# Patient Record
Sex: Male | Born: 1945 | Race: Black or African American | Hispanic: No | Marital: Married | Smoking: Former smoker
Health system: Southern US, Community
[De-identification: ages and names within clinical notes are randomized; demographics above are authoritative.]

## PROBLEM LIST (undated history)

## (undated) DIAGNOSIS — I2510 Atherosclerotic heart disease of native coronary artery without angina pectoris: Secondary | ICD-10-CM

## (undated) DIAGNOSIS — E785 Hyperlipidemia, unspecified: Secondary | ICD-10-CM

## (undated) DIAGNOSIS — K269 Duodenal ulcer, unspecified as acute or chronic, without hemorrhage or perforation: Secondary | ICD-10-CM

## (undated) DIAGNOSIS — E119 Type 2 diabetes mellitus without complications: Secondary | ICD-10-CM

## (undated) DIAGNOSIS — D649 Anemia, unspecified: Secondary | ICD-10-CM

## (undated) DIAGNOSIS — N529 Male erectile dysfunction, unspecified: Secondary | ICD-10-CM

## (undated) DIAGNOSIS — M1990 Unspecified osteoarthritis, unspecified site: Secondary | ICD-10-CM

## (undated) DIAGNOSIS — K219 Gastro-esophageal reflux disease without esophagitis: Secondary | ICD-10-CM

## (undated) DIAGNOSIS — I10 Essential (primary) hypertension: Secondary | ICD-10-CM

## (undated) DIAGNOSIS — K5790 Diverticulosis of intestine, part unspecified, without perforation or abscess without bleeding: Secondary | ICD-10-CM

## (undated) HISTORY — PX: VASECTOMY: SHX75

## (undated) HISTORY — PX: CARDIAC CATHETERIZATION: SHX172

## (undated) HISTORY — PX: JOINT REPLACEMENT: SHX530

---

## 2004-06-18 ENCOUNTER — Ambulatory Visit: Payer: Self-pay | Admitting: Unknown Physician Specialty

## 2007-12-07 ENCOUNTER — Ambulatory Visit: Payer: Self-pay | Admitting: Family Medicine

## 2008-05-10 ENCOUNTER — Ambulatory Visit: Payer: Self-pay | Admitting: Cardiology

## 2009-07-02 ENCOUNTER — Ambulatory Visit: Payer: Self-pay

## 2009-08-06 ENCOUNTER — Ambulatory Visit: Payer: Self-pay | Admitting: Pain Medicine

## 2009-08-20 ENCOUNTER — Ambulatory Visit: Payer: Self-pay | Admitting: Pain Medicine

## 2009-09-11 ENCOUNTER — Ambulatory Visit: Payer: Self-pay | Admitting: Pain Medicine

## 2009-09-24 ENCOUNTER — Ambulatory Visit: Payer: Self-pay | Admitting: Pain Medicine

## 2009-10-08 ENCOUNTER — Ambulatory Visit: Payer: Self-pay | Admitting: Pain Medicine

## 2009-10-24 ENCOUNTER — Ambulatory Visit: Payer: Self-pay | Admitting: Pain Medicine

## 2010-09-22 ENCOUNTER — Ambulatory Visit: Payer: Self-pay | Admitting: Unknown Physician Specialty

## 2010-10-01 ENCOUNTER — Inpatient Hospital Stay: Payer: Self-pay | Admitting: Unknown Physician Specialty

## 2013-11-01 DIAGNOSIS — E119 Type 2 diabetes mellitus without complications: Secondary | ICD-10-CM

## 2014-08-06 DIAGNOSIS — E1140 Type 2 diabetes mellitus with diabetic neuropathy, unspecified: Secondary | ICD-10-CM | POA: Diagnosis not present

## 2014-08-06 DIAGNOSIS — E780 Pure hypercholesterolemia: Secondary | ICD-10-CM | POA: Diagnosis not present

## 2014-08-08 DIAGNOSIS — I10 Essential (primary) hypertension: Secondary | ICD-10-CM | POA: Diagnosis not present

## 2014-08-08 DIAGNOSIS — E780 Pure hypercholesterolemia: Secondary | ICD-10-CM | POA: Diagnosis not present

## 2014-08-08 DIAGNOSIS — I2510 Atherosclerotic heart disease of native coronary artery without angina pectoris: Secondary | ICD-10-CM | POA: Diagnosis not present

## 2014-08-08 DIAGNOSIS — R0602 Shortness of breath: Secondary | ICD-10-CM | POA: Diagnosis not present

## 2014-08-10 DIAGNOSIS — K219 Gastro-esophageal reflux disease without esophagitis: Secondary | ICD-10-CM | POA: Diagnosis not present

## 2014-08-10 DIAGNOSIS — I10 Essential (primary) hypertension: Secondary | ICD-10-CM | POA: Diagnosis not present

## 2014-08-10 DIAGNOSIS — E780 Pure hypercholesterolemia: Secondary | ICD-10-CM | POA: Diagnosis not present

## 2014-08-10 DIAGNOSIS — E119 Type 2 diabetes mellitus without complications: Secondary | ICD-10-CM | POA: Diagnosis not present

## 2014-08-21 DIAGNOSIS — R0602 Shortness of breath: Secondary | ICD-10-CM | POA: Diagnosis not present

## 2014-08-23 DIAGNOSIS — E780 Pure hypercholesterolemia: Secondary | ICD-10-CM | POA: Diagnosis not present

## 2014-08-23 DIAGNOSIS — Z9861 Coronary angioplasty status: Secondary | ICD-10-CM | POA: Diagnosis not present

## 2014-08-23 DIAGNOSIS — I10 Essential (primary) hypertension: Secondary | ICD-10-CM | POA: Diagnosis not present

## 2014-08-23 DIAGNOSIS — I2510 Atherosclerotic heart disease of native coronary artery without angina pectoris: Secondary | ICD-10-CM | POA: Diagnosis not present

## 2014-11-02 DIAGNOSIS — E119 Type 2 diabetes mellitus without complications: Secondary | ICD-10-CM | POA: Diagnosis not present

## 2014-11-02 DIAGNOSIS — E780 Pure hypercholesterolemia: Secondary | ICD-10-CM | POA: Diagnosis not present

## 2014-11-09 DIAGNOSIS — K219 Gastro-esophageal reflux disease without esophagitis: Secondary | ICD-10-CM | POA: Diagnosis not present

## 2014-11-09 DIAGNOSIS — E780 Pure hypercholesterolemia: Secondary | ICD-10-CM | POA: Diagnosis not present

## 2014-11-09 DIAGNOSIS — E119 Type 2 diabetes mellitus without complications: Secondary | ICD-10-CM | POA: Diagnosis not present

## 2014-11-09 DIAGNOSIS — I10 Essential (primary) hypertension: Secondary | ICD-10-CM | POA: Diagnosis not present

## 2015-02-08 DIAGNOSIS — E780 Pure hypercholesterolemia: Secondary | ICD-10-CM | POA: Diagnosis not present

## 2015-02-08 DIAGNOSIS — E119 Type 2 diabetes mellitus without complications: Secondary | ICD-10-CM | POA: Diagnosis not present

## 2015-02-14 DIAGNOSIS — I10 Essential (primary) hypertension: Secondary | ICD-10-CM | POA: Diagnosis not present

## 2015-02-14 DIAGNOSIS — K219 Gastro-esophageal reflux disease without esophagitis: Secondary | ICD-10-CM | POA: Diagnosis not present

## 2015-02-14 DIAGNOSIS — E780 Pure hypercholesterolemia: Secondary | ICD-10-CM | POA: Diagnosis not present

## 2015-02-14 DIAGNOSIS — E119 Type 2 diabetes mellitus without complications: Secondary | ICD-10-CM | POA: Diagnosis not present

## 2015-05-10 DIAGNOSIS — E119 Type 2 diabetes mellitus without complications: Secondary | ICD-10-CM | POA: Diagnosis not present

## 2015-05-10 DIAGNOSIS — E7800 Pure hypercholesterolemia, unspecified: Secondary | ICD-10-CM | POA: Diagnosis not present

## 2015-05-14 DIAGNOSIS — I10 Essential (primary) hypertension: Secondary | ICD-10-CM | POA: Diagnosis not present

## 2015-05-14 DIAGNOSIS — E119 Type 2 diabetes mellitus without complications: Secondary | ICD-10-CM | POA: Diagnosis not present

## 2015-05-14 DIAGNOSIS — Z9861 Coronary angioplasty status: Secondary | ICD-10-CM | POA: Diagnosis not present

## 2015-05-14 DIAGNOSIS — I2510 Atherosclerotic heart disease of native coronary artery without angina pectoris: Secondary | ICD-10-CM | POA: Diagnosis not present

## 2015-05-14 DIAGNOSIS — E7800 Pure hypercholesterolemia, unspecified: Secondary | ICD-10-CM | POA: Diagnosis not present

## 2015-07-05 DIAGNOSIS — K219 Gastro-esophageal reflux disease without esophagitis: Secondary | ICD-10-CM | POA: Diagnosis not present

## 2015-07-05 DIAGNOSIS — I10 Essential (primary) hypertension: Secondary | ICD-10-CM | POA: Diagnosis not present

## 2015-07-05 DIAGNOSIS — E1142 Type 2 diabetes mellitus with diabetic polyneuropathy: Secondary | ICD-10-CM | POA: Diagnosis not present

## 2015-07-05 DIAGNOSIS — E7800 Pure hypercholesterolemia, unspecified: Secondary | ICD-10-CM | POA: Diagnosis not present

## 2015-12-05 DIAGNOSIS — I2510 Atherosclerotic heart disease of native coronary artery without angina pectoris: Secondary | ICD-10-CM | POA: Diagnosis not present

## 2015-12-05 DIAGNOSIS — E7800 Pure hypercholesterolemia, unspecified: Secondary | ICD-10-CM | POA: Diagnosis not present

## 2015-12-05 DIAGNOSIS — I10 Essential (primary) hypertension: Secondary | ICD-10-CM | POA: Diagnosis not present

## 2015-12-05 DIAGNOSIS — Z9861 Coronary angioplasty status: Secondary | ICD-10-CM | POA: Diagnosis not present

## 2015-12-27 DIAGNOSIS — E7800 Pure hypercholesterolemia, unspecified: Secondary | ICD-10-CM | POA: Diagnosis not present

## 2015-12-27 DIAGNOSIS — E1142 Type 2 diabetes mellitus with diabetic polyneuropathy: Secondary | ICD-10-CM | POA: Diagnosis not present

## 2016-01-15 DIAGNOSIS — I10 Essential (primary) hypertension: Secondary | ICD-10-CM | POA: Diagnosis not present

## 2016-01-15 DIAGNOSIS — E1142 Type 2 diabetes mellitus with diabetic polyneuropathy: Secondary | ICD-10-CM | POA: Diagnosis not present

## 2016-01-15 DIAGNOSIS — K219 Gastro-esophageal reflux disease without esophagitis: Secondary | ICD-10-CM | POA: Diagnosis not present

## 2016-01-15 DIAGNOSIS — E1165 Type 2 diabetes mellitus with hyperglycemia: Secondary | ICD-10-CM | POA: Diagnosis not present

## 2016-01-15 DIAGNOSIS — E7800 Pure hypercholesterolemia, unspecified: Secondary | ICD-10-CM | POA: Diagnosis not present

## 2016-04-09 DIAGNOSIS — E1142 Type 2 diabetes mellitus with diabetic polyneuropathy: Secondary | ICD-10-CM | POA: Diagnosis not present

## 2016-04-09 DIAGNOSIS — E7800 Pure hypercholesterolemia, unspecified: Secondary | ICD-10-CM | POA: Diagnosis not present

## 2016-04-09 DIAGNOSIS — E1165 Type 2 diabetes mellitus with hyperglycemia: Secondary | ICD-10-CM | POA: Diagnosis not present

## 2016-04-20 DIAGNOSIS — E7800 Pure hypercholesterolemia, unspecified: Secondary | ICD-10-CM | POA: Diagnosis not present

## 2016-04-20 DIAGNOSIS — E1165 Type 2 diabetes mellitus with hyperglycemia: Secondary | ICD-10-CM | POA: Diagnosis not present

## 2016-04-20 DIAGNOSIS — I10 Essential (primary) hypertension: Secondary | ICD-10-CM | POA: Diagnosis not present

## 2016-04-20 DIAGNOSIS — K219 Gastro-esophageal reflux disease without esophagitis: Secondary | ICD-10-CM | POA: Diagnosis not present

## 2016-04-20 DIAGNOSIS — E1142 Type 2 diabetes mellitus with diabetic polyneuropathy: Secondary | ICD-10-CM | POA: Diagnosis not present

## 2016-07-07 DIAGNOSIS — Z9861 Coronary angioplasty status: Secondary | ICD-10-CM | POA: Diagnosis not present

## 2016-07-07 DIAGNOSIS — N521 Erectile dysfunction due to diseases classified elsewhere: Secondary | ICD-10-CM | POA: Diagnosis not present

## 2016-07-07 DIAGNOSIS — E7800 Pure hypercholesterolemia, unspecified: Secondary | ICD-10-CM | POA: Diagnosis not present

## 2016-07-07 DIAGNOSIS — I10 Essential (primary) hypertension: Secondary | ICD-10-CM | POA: Diagnosis not present

## 2016-07-07 DIAGNOSIS — I2510 Atherosclerotic heart disease of native coronary artery without angina pectoris: Secondary | ICD-10-CM | POA: Diagnosis not present

## 2016-07-09 DIAGNOSIS — E1142 Type 2 diabetes mellitus with diabetic polyneuropathy: Secondary | ICD-10-CM | POA: Diagnosis not present

## 2016-07-09 DIAGNOSIS — E7800 Pure hypercholesterolemia, unspecified: Secondary | ICD-10-CM | POA: Diagnosis not present

## 2016-07-09 DIAGNOSIS — E1165 Type 2 diabetes mellitus with hyperglycemia: Secondary | ICD-10-CM | POA: Diagnosis not present

## 2016-07-16 DIAGNOSIS — E1142 Type 2 diabetes mellitus with diabetic polyneuropathy: Secondary | ICD-10-CM | POA: Diagnosis not present

## 2016-07-16 DIAGNOSIS — I10 Essential (primary) hypertension: Secondary | ICD-10-CM | POA: Diagnosis not present

## 2016-07-16 DIAGNOSIS — K219 Gastro-esophageal reflux disease without esophagitis: Secondary | ICD-10-CM | POA: Diagnosis not present

## 2016-07-16 DIAGNOSIS — E7800 Pure hypercholesterolemia, unspecified: Secondary | ICD-10-CM | POA: Diagnosis not present

## 2016-09-25 DIAGNOSIS — E1142 Type 2 diabetes mellitus with diabetic polyneuropathy: Secondary | ICD-10-CM | POA: Diagnosis not present

## 2016-09-25 DIAGNOSIS — E7800 Pure hypercholesterolemia, unspecified: Secondary | ICD-10-CM | POA: Diagnosis not present

## 2016-10-01 DIAGNOSIS — E1142 Type 2 diabetes mellitus with diabetic polyneuropathy: Secondary | ICD-10-CM | POA: Diagnosis not present

## 2016-10-01 DIAGNOSIS — K219 Gastro-esophageal reflux disease without esophagitis: Secondary | ICD-10-CM | POA: Diagnosis not present

## 2016-10-01 DIAGNOSIS — I10 Essential (primary) hypertension: Secondary | ICD-10-CM | POA: Diagnosis not present

## 2016-10-01 DIAGNOSIS — E7800 Pure hypercholesterolemia, unspecified: Secondary | ICD-10-CM | POA: Diagnosis not present

## 2016-12-31 DIAGNOSIS — E7800 Pure hypercholesterolemia, unspecified: Secondary | ICD-10-CM | POA: Diagnosis not present

## 2016-12-31 DIAGNOSIS — E1142 Type 2 diabetes mellitus with diabetic polyneuropathy: Secondary | ICD-10-CM | POA: Diagnosis not present

## 2017-01-06 DIAGNOSIS — Z23 Encounter for immunization: Secondary | ICD-10-CM | POA: Diagnosis not present

## 2017-01-06 DIAGNOSIS — Z6835 Body mass index (BMI) 35.0-35.9, adult: Secondary | ICD-10-CM | POA: Diagnosis not present

## 2017-01-06 DIAGNOSIS — K219 Gastro-esophageal reflux disease without esophagitis: Secondary | ICD-10-CM | POA: Diagnosis not present

## 2017-01-06 DIAGNOSIS — I10 Essential (primary) hypertension: Secondary | ICD-10-CM | POA: Diagnosis not present

## 2017-01-06 DIAGNOSIS — E1165 Type 2 diabetes mellitus with hyperglycemia: Secondary | ICD-10-CM | POA: Diagnosis not present

## 2017-01-06 DIAGNOSIS — E7800 Pure hypercholesterolemia, unspecified: Secondary | ICD-10-CM | POA: Diagnosis not present

## 2017-01-06 DIAGNOSIS — E1142 Type 2 diabetes mellitus with diabetic polyneuropathy: Secondary | ICD-10-CM | POA: Diagnosis not present

## 2017-01-06 DIAGNOSIS — Z1211 Encounter for screening for malignant neoplasm of colon: Secondary | ICD-10-CM | POA: Diagnosis not present

## 2017-01-12 DIAGNOSIS — I2510 Atherosclerotic heart disease of native coronary artery without angina pectoris: Secondary | ICD-10-CM | POA: Diagnosis not present

## 2017-01-12 DIAGNOSIS — Z9861 Coronary angioplasty status: Secondary | ICD-10-CM | POA: Diagnosis not present

## 2017-01-12 DIAGNOSIS — I10 Essential (primary) hypertension: Secondary | ICD-10-CM | POA: Diagnosis not present

## 2017-01-12 DIAGNOSIS — E119 Type 2 diabetes mellitus without complications: Secondary | ICD-10-CM | POA: Diagnosis not present

## 2017-01-12 DIAGNOSIS — E7800 Pure hypercholesterolemia, unspecified: Secondary | ICD-10-CM | POA: Diagnosis not present

## 2017-03-10 DIAGNOSIS — K219 Gastro-esophageal reflux disease without esophagitis: Secondary | ICD-10-CM | POA: Diagnosis not present

## 2017-03-10 DIAGNOSIS — Z1211 Encounter for screening for malignant neoplasm of colon: Secondary | ICD-10-CM | POA: Diagnosis not present

## 2017-04-09 DIAGNOSIS — E785 Hyperlipidemia, unspecified: Secondary | ICD-10-CM | POA: Diagnosis not present

## 2017-04-09 DIAGNOSIS — E1142 Type 2 diabetes mellitus with diabetic polyneuropathy: Secondary | ICD-10-CM | POA: Diagnosis not present

## 2017-04-12 DIAGNOSIS — E1165 Type 2 diabetes mellitus with hyperglycemia: Secondary | ICD-10-CM | POA: Diagnosis not present

## 2017-04-12 DIAGNOSIS — E1142 Type 2 diabetes mellitus with diabetic polyneuropathy: Secondary | ICD-10-CM | POA: Diagnosis not present

## 2017-04-12 DIAGNOSIS — E7800 Pure hypercholesterolemia, unspecified: Secondary | ICD-10-CM | POA: Diagnosis not present

## 2017-04-12 DIAGNOSIS — Z6835 Body mass index (BMI) 35.0-35.9, adult: Secondary | ICD-10-CM | POA: Diagnosis not present

## 2017-04-12 DIAGNOSIS — I10 Essential (primary) hypertension: Secondary | ICD-10-CM | POA: Diagnosis not present

## 2017-04-12 DIAGNOSIS — K219 Gastro-esophageal reflux disease without esophagitis: Secondary | ICD-10-CM | POA: Diagnosis not present

## 2017-05-28 ENCOUNTER — Encounter: Payer: Self-pay | Admitting: *Deleted

## 2017-05-31 ENCOUNTER — Ambulatory Visit: Payer: Medicare HMO | Admitting: Anesthesiology

## 2017-05-31 ENCOUNTER — Encounter
Admission: RE | Disposition: A | Discharge status: Home / Self Care | Payer: Self-pay | Source: Ambulatory Visit | Attending: Unknown Physician Specialty

## 2017-05-31 ENCOUNTER — Ambulatory Visit
Admission: RE | Admit: 2017-05-31 | Discharge: 2017-05-31 | Disposition: A | Discharge status: Home / Self Care | Payer: Medicare HMO | Source: Ambulatory Visit | Attending: Unknown Physician Specialty | Admitting: Unknown Physician Specialty

## 2017-05-31 DIAGNOSIS — Z1211 Encounter for screening for malignant neoplasm of colon: Secondary | ICD-10-CM | POA: Insufficient documentation

## 2017-05-31 DIAGNOSIS — I10 Essential (primary) hypertension: Secondary | ICD-10-CM | POA: Diagnosis not present

## 2017-05-31 DIAGNOSIS — Z86010 Personal history of colonic polyps: Secondary | ICD-10-CM | POA: Diagnosis not present

## 2017-05-31 DIAGNOSIS — Z87891 Personal history of nicotine dependence: Secondary | ICD-10-CM | POA: Diagnosis not present

## 2017-05-31 DIAGNOSIS — Z7982 Long term (current) use of aspirin: Secondary | ICD-10-CM | POA: Insufficient documentation

## 2017-05-31 DIAGNOSIS — K5730 Diverticulosis of large intestine without perforation or abscess without bleeding: Secondary | ICD-10-CM | POA: Diagnosis not present

## 2017-05-31 DIAGNOSIS — Z7984 Long term (current) use of oral hypoglycemic drugs: Secondary | ICD-10-CM | POA: Diagnosis not present

## 2017-05-31 DIAGNOSIS — K640 First degree hemorrhoids: Secondary | ICD-10-CM | POA: Diagnosis not present

## 2017-05-31 DIAGNOSIS — K219 Gastro-esophageal reflux disease without esophagitis: Secondary | ICD-10-CM | POA: Insufficient documentation

## 2017-05-31 DIAGNOSIS — E119 Type 2 diabetes mellitus without complications: Secondary | ICD-10-CM | POA: Diagnosis not present

## 2017-05-31 DIAGNOSIS — D122 Benign neoplasm of ascending colon: Secondary | ICD-10-CM | POA: Diagnosis not present

## 2017-05-31 DIAGNOSIS — Z79899 Other long term (current) drug therapy: Secondary | ICD-10-CM | POA: Diagnosis not present

## 2017-05-31 DIAGNOSIS — Z6834 Body mass index (BMI) 34.0-34.9, adult: Secondary | ICD-10-CM | POA: Diagnosis not present

## 2017-05-31 DIAGNOSIS — K621 Rectal polyp: Secondary | ICD-10-CM | POA: Insufficient documentation

## 2017-05-31 DIAGNOSIS — D128 Benign neoplasm of rectum: Secondary | ICD-10-CM | POA: Diagnosis not present

## 2017-05-31 DIAGNOSIS — I2510 Atherosclerotic heart disease of native coronary artery without angina pectoris: Secondary | ICD-10-CM | POA: Diagnosis not present

## 2017-05-31 DIAGNOSIS — E785 Hyperlipidemia, unspecified: Secondary | ICD-10-CM | POA: Insufficient documentation

## 2017-05-31 DIAGNOSIS — K635 Polyp of colon: Secondary | ICD-10-CM | POA: Diagnosis not present

## 2017-05-31 DIAGNOSIS — K648 Other hemorrhoids: Secondary | ICD-10-CM | POA: Diagnosis not present

## 2017-05-31 DIAGNOSIS — K5790 Diverticulosis of intestine, part unspecified, without perforation or abscess without bleeding: Secondary | ICD-10-CM | POA: Diagnosis not present

## 2017-05-31 HISTORY — DX: Anemia, unspecified: D64.9

## 2017-05-31 HISTORY — DX: Type 2 diabetes mellitus without complications: E11.9

## 2017-05-31 HISTORY — DX: Atherosclerotic heart disease of native coronary artery without angina pectoris: I25.10

## 2017-05-31 HISTORY — DX: Male erectile dysfunction, unspecified: N52.9

## 2017-05-31 HISTORY — DX: Unspecified osteoarthritis, unspecified site: M19.90

## 2017-05-31 HISTORY — DX: Hyperlipidemia, unspecified: E78.5

## 2017-05-31 HISTORY — PX: COLONOSCOPY WITH PROPOFOL: SHX5780

## 2017-05-31 HISTORY — DX: Essential (primary) hypertension: I10

## 2017-05-31 HISTORY — DX: Gastro-esophageal reflux disease without esophagitis: K21.9

## 2017-05-31 HISTORY — DX: Diverticulosis of intestine, part unspecified, without perforation or abscess without bleeding: K57.90

## 2017-05-31 HISTORY — DX: Duodenal ulcer, unspecified as acute or chronic, without hemorrhage or perforation: K26.9

## 2017-05-31 LAB — GLUCOSE, CAPILLARY: Glucose-Capillary: 181 mg/dL — ABNORMAL HIGH (ref 65–99)

## 2017-05-31 SURGERY — COLONOSCOPY WITH PROPOFOL
Anesthesia: General

## 2017-05-31 MED ORDER — VANCOMYCIN HCL IN DEXTROSE 1-5 GM/200ML-% IV SOLN
INTRAVENOUS | Status: AC
  Filled 2017-05-31: qty 200

## 2017-05-31 MED ORDER — VANCOMYCIN HCL IN DEXTROSE 1-5 GM/200ML-% IV SOLN
1000.0000 mg | Freq: Once | INTRAVENOUS | Status: AC
  Administered 2017-05-31: 1000 mg via INTRAVENOUS

## 2017-05-31 MED ORDER — GENTAMICIN IN SALINE 1-0.9 MG/ML-% IV SOLN
100.0000 mg | Freq: Once | INTRAVENOUS | Status: DC
  Filled 2017-05-31: qty 100

## 2017-05-31 MED ORDER — PROPOFOL 500 MG/50ML IV EMUL
INTRAVENOUS | Status: DC | PRN
  Administered 2017-05-31: 200 ug/kg/min via INTRAVENOUS

## 2017-05-31 MED ORDER — GENTAMICIN SULFATE 40 MG/ML IJ SOLN
100.0000 mg | Freq: Once | INTRAVENOUS | Status: AC
  Administered 2017-05-31: 100 mg via INTRAVENOUS
  Filled 2017-05-31: qty 2.5

## 2017-05-31 MED ORDER — PROPOFOL 500 MG/50ML IV EMUL
INTRAVENOUS | Status: AC
  Filled 2017-05-31: qty 50

## 2017-05-31 MED ORDER — PROPOFOL 10 MG/ML IV BOLUS
INTRAVENOUS | Status: DC | PRN
  Administered 2017-05-31: 80 mg via INTRAVENOUS

## 2017-05-31 MED ORDER — LIDOCAINE HCL (PF) 2 % IJ SOLN
INTRAMUSCULAR | Status: AC
  Filled 2017-05-31: qty 10

## 2017-05-31 MED ORDER — SODIUM CHLORIDE 0.9 % IV SOLN: INTRAVENOUS | Status: DC

## 2017-05-31 MED ORDER — LIDOCAINE 2% (20 MG/ML) 5 ML SYRINGE
INTRAMUSCULAR | Status: DC | PRN
  Administered 2017-05-31: 50 mg via INTRAVENOUS

## 2017-05-31 MED ORDER — SODIUM CHLORIDE 0.9 % IV SOLN
INTRAVENOUS | Status: DC
  Administered 2017-05-31: 1000 mL via INTRAVENOUS

## 2017-05-31 NOTE — H&P (Signed)
Primary Care Physician:  Sofie Hartigan, MD Primary Gastroenterologist:  Dr. Vira Agar  Pre-Procedure History & Physical: HPI:  Jordan [name] is a 71 y.o. male is here for an colonoscopy.   Past Medical History:  Diagnosis Date  . Anemia   . Arthritis   . Coronary artery disease   . Diabetes mellitus without complication ([place])   . Diverticulosis   . Duodenal ulcer   . Erectile dysfunction   . GERD (gastroesophageal reflux disease)   . Hyperlipidemia   . Hypertension     Past Surgical History:  Procedure Laterality Date  . CARDIAC CATHETERIZATION    . JOINT REPLACEMENT    . VASECTOMY      Prior to Admission medications   Medication Sig Start Date End Date Taking? Authorizing Provider  amLODipine (NORVASC) 10 MG tablet Take 10 mg by mouth daily.   Yes [provider]  ascorbic acid (VITAMIN C) 1000 MG tablet Take 1,000 mg by mouth daily.   Yes [provider]  aspirin EC 81 MG tablet Take 81 mg by mouth daily.   Yes [provider]  atorvastatin (LIPITOR) 20 MG tablet Take 20 mg by mouth daily.   Yes [provider]  CINNAMON PO Take by mouth daily.   Yes [provider]  hydrochlorothiazide (HYDRODIURIL) 25 MG tablet Take 25 mg by mouth daily.   Yes [provider]  metFORMIN (GLUCOPHAGE) 1000 MG tablet Take 1,000 mg by mouth 2 (two) times daily with a meal.   Yes [provider]  omega-3 acid ethyl esters (LOVAZA) 1 g capsule Take 1 capsule by mouth 2 (two) times daily.   Yes [provider]  ramipril (ALTACE) 10 MG capsule Take 10 mg by mouth 2 (two) times daily.   Yes [provider]  ranitidine (ZANTAC) 150 MG capsule Take 150 mg by mouth daily.   Yes [provider]  sitaGLIPtin (JANUVIA) 100 MG tablet Take 100 mg by mouth daily. Taking 1/2 tablet by mouth daily   Yes [provider]    Allergies as of 03/16/2017  . (Not on File)    History reviewed. No pertinent  family history.  Social History   Social History  . Marital status: Married    Spouse name: N/A  . Number of children: N/A  . Years of education: N/A   Occupational History  . Not on file.   Social History Main Topics  . Smoking status: Former Smoker    Types: Cigarettes    Quit date: 11/02/1987  . Smokeless tobacco: Never Used  . Alcohol use No  . Drug use: No  . Sexual activity: Not on file   Other Topics Concern  . Not on file   Social History Narrative  . No narrative on file    Review of Systems: See HPI, otherwise negative ROS  Physical Exam: BP (!) 143/90   Pulse 80   Temp (!) 97 F (36.1 C) (Tympanic)   Resp (!) 21   Ht 5\' 11"  (1.803 m)   Wt 112 kg (247 lb)   SpO2 95%   BMI 34.45 kg/m  General:   Alert,  pleasant and cooperative in NAD Head:  Normocephalic and atraumatic. Neck:  Supple; no masses or thyromegaly. Lungs:  Clear throughout to auscultation.    Heart:  Regular rate and rhythm. Abdomen:  Soft, nontender and nondistended. Normal bowel sounds, without guarding, and without rebound.   Neurologic:  Alert and  oriented x4;  grossly normal neurologically.  Impression/Plan: Jordan [name] is here for an colonoscopy to be performed for colon cancer screening.  Risks, benefits, limitations, and alternatives regarding  colonoscopy have been reviewed with the patient.  Questions have been answered.  All parties agreeable.   Gaylyn Cheers, MD  05/31/2017, 10:26 AM

## 2017-05-31 NOTE — Op Note (Signed)
[facility] Patient Name: Jordan [name] Procedure Date: 05/31/2017 9:12 AM MRN: [id number] Account #: [id number] Date of Birth: 02-Jan-1946 Admit Type: Outpatient Age: 71 Room: [facility] ENDO ROOM 1 Gender: Male Note Status: Finalized Procedure:            Colonoscopy Indications:          Screening for colorectal malignant neoplasm Providers:            Manya Silvas, MD Referring MD:         Sofie Hartigan (Referring MD) Medicines:            Propofol per Anesthesia Complications:        No immediate complications. Procedure:            Pre-Anesthesia Assessment:                       - After reviewing the risks and benefits, the patient                        was deemed in satisfactory condition to undergo the                        procedure.                       After obtaining informed consent, the colonoscope was                        passed under direct vision. Throughout the procedure,                        the patient's blood pressure, pulse, and oxygen                        saturations were monitored continuously. The Olympus                        PCF-H180AL colonoscope ( S#: [id number] ) was introduced                        through the anus and advanced to the the cecum,                        identified by appendiceal orifice and ileocecal valve.                        The colonoscopy was performed without difficulty. The                        patient tolerated the procedure well. The quality of                        the bowel preparation was excellent. Findings:      A diminutive polyp was found in the mid ascending colon. The polyp was       sessile. The polyp was removed with a jumbo cold forceps. Resection and       retrieval were complete.      A diminutive polyp was found in the rectum. The polyp was sessile. The       polyp was removed with a jumbo cold  forceps. Resection and retrieval       were complete.  Multiple small-mouthed diverticula were found in the sigmoid colon and       descending colon.      Internal hemorrhoids were found during endoscopy. The hemorrhoids were       small and Grade I (internal hemorrhoids that do not prolapse).      The exam was otherwise without abnormality. Impression:           - One diminutive polyp in the mid ascending colon,                        removed with a jumbo cold forceps. Resected and                        retrieved.                       - One diminutive polyp in the rectum, removed with a                        jumbo cold forceps. Resected and retrieved.                       - Diverticulosis in the sigmoid colon and in the                        descending colon.                       - Internal hemorrhoids.                       - The examination was otherwise normal. Recommendation:       - Await pathology results. Manya Silvas, MD 05/31/2017 9:52:09 AM This report has been signed electronically. Number of Addenda: 0 Note Initiated On: 05/31/2017 9:12 AM Scope Withdrawal Time: 0 hours 8 minutes 59 seconds  Total Procedure Duration: 0 hours 22 minutes 24 seconds       [facility]

## 2017-05-31 NOTE — Anesthesia Postprocedure Evaluation (Signed)
Anesthesia Post Note  Patient: Jordan [name]  Procedure(s) Performed: COLONOSCOPY WITH PROPOFOL (N/A )  Patient location during evaluation: PACU Level of consciousness: awake Pain management: pain level controlled Vital Signs Assessment: post-procedure vital signs reviewed and stable Respiratory status: spontaneous breathing Cardiovascular status: stable Anesthetic complications: no     Last Vitals:  Vitals:   05/31/17 0956 05/31/17 1003  BP: 121/71 (!) 143/90  Pulse: 80   Resp: (!) 21   Temp: (!) 36.1 C   SpO2: 95%     Last Pain:  Vitals:   05/31/17 0956  TempSrc: Tympanic                 VAN [name],Jordan Piscopo

## 2017-05-31 NOTE — Transfer of Care (Signed)
Immediate Anesthesia Transfer of Care Note  Patient: Jordan [name]  Procedure(s) Performed: COLONOSCOPY WITH PROPOFOL (N/A )  Patient Location: Endoscopy Unit  Anesthesia Type:General  Level of Consciousness: awake  Airway & Oxygen Therapy: Patient connected to nasal cannula oxygen  Post-op Assessment: Post -op Vital signs reviewed and stable  Post vital signs: stable  Last Vitals:  Vitals:   05/31/17 0953 05/31/17 0956  BP: 121/71 121/71  Pulse: 78 80  Resp: (!) 21 (!) 21  Temp: (!) 36.1 C (!) 36.1 C  SpO2: 94% 95%    Last Pain:  Vitals:   05/31/17 0956  TempSrc: Tympanic         Complications: No apparent anesthesia complications

## 2017-05-31 NOTE — Anesthesia Post-op Follow-up Note (Signed)
Anesthesia QCDR form completed.        

## 2017-05-31 NOTE — Anesthesia Preprocedure Evaluation (Signed)
Anesthesia Evaluation  Patient identified by MRN, date of birth, ID band Patient awake    Reviewed: Allergy & Precautions, NPO status , Patient's Chart, lab work & pertinent test results  Airway Mallampati: III       Dental  (+) Teeth Intact   Pulmonary former smoker,     + decreased breath sounds      Cardiovascular Exercise Tolerance: Good hypertension, Pt. on medications + CAD   Rhythm:Regular     Neuro/Psych negative neurological ROS  negative psych ROS   GI/Hepatic Neg liver ROS, PUD, GERD  Medicated,  Endo/Other  diabetes, Type 2, Oral Hypoglycemic AgentsMorbid obesity  Renal/GU      Musculoskeletal   Abdominal (+) + obese,   Peds negative pediatric ROS (+)  Hematology  (+) anemia ,   Anesthesia Other Findings   Reproductive/Obstetrics                             Anesthesia Physical Anesthesia Plan  ASA: II  Anesthesia Plan:    Post-op Pain Management:    Induction: Intravenous  PONV Risk Score and Plan: 0  Airway Management Planned: Natural Airway and Nasal Cannula  Additional Equipment:   Intra-op Plan:   Post-operative Plan:   Informed Consent: I have reviewed the patients History and Physical, chart, labs and discussed the procedure including the risks, benefits and alternatives for the proposed anesthesia with the patient or authorized representative who has indicated his/her understanding and acceptance.     Plan Discussed with: CRNA  Anesthesia Plan Comments:         Anesthesia Quick Evaluation

## 2017-06-01 LAB — SURGICAL PATHOLOGY

## 2017-06-02 ENCOUNTER — Encounter: Payer: Self-pay | Admitting: Unknown Physician Specialty

## 2017-06-21 DIAGNOSIS — Z6835 Body mass index (BMI) 35.0-35.9, adult: Secondary | ICD-10-CM | POA: Diagnosis not present

## 2017-06-21 DIAGNOSIS — R413 Other amnesia: Secondary | ICD-10-CM | POA: Diagnosis not present

## 2017-06-21 DIAGNOSIS — E1165 Type 2 diabetes mellitus with hyperglycemia: Secondary | ICD-10-CM | POA: Diagnosis not present

## 2017-07-15 DIAGNOSIS — I10 Essential (primary) hypertension: Secondary | ICD-10-CM | POA: Diagnosis not present

## 2017-07-15 DIAGNOSIS — Z9861 Coronary angioplasty status: Secondary | ICD-10-CM | POA: Diagnosis not present

## 2017-07-15 DIAGNOSIS — I2510 Atherosclerotic heart disease of native coronary artery without angina pectoris: Secondary | ICD-10-CM | POA: Diagnosis not present

## 2017-07-15 DIAGNOSIS — E7800 Pure hypercholesterolemia, unspecified: Secondary | ICD-10-CM | POA: Diagnosis not present

## 2017-07-15 DIAGNOSIS — E1165 Type 2 diabetes mellitus with hyperglycemia: Secondary | ICD-10-CM | POA: Diagnosis not present

## 2017-07-15 DIAGNOSIS — E119 Type 2 diabetes mellitus without complications: Secondary | ICD-10-CM | POA: Diagnosis not present

## 2017-07-15 DIAGNOSIS — R413 Other amnesia: Secondary | ICD-10-CM | POA: Diagnosis not present

## 2017-07-22 DIAGNOSIS — E119 Type 2 diabetes mellitus without complications: Secondary | ICD-10-CM | POA: Diagnosis not present

## 2017-07-22 DIAGNOSIS — Z6835 Body mass index (BMI) 35.0-35.9, adult: Secondary | ICD-10-CM | POA: Diagnosis not present

## 2017-07-22 DIAGNOSIS — E785 Hyperlipidemia, unspecified: Secondary | ICD-10-CM | POA: Diagnosis not present

## 2017-07-22 DIAGNOSIS — Z72820 Sleep deprivation: Secondary | ICD-10-CM | POA: Diagnosis not present

## 2017-10-05 DIAGNOSIS — E7800 Pure hypercholesterolemia, unspecified: Secondary | ICD-10-CM | POA: Diagnosis not present

## 2017-10-12 DIAGNOSIS — E7800 Pure hypercholesterolemia, unspecified: Secondary | ICD-10-CM | POA: Diagnosis not present

## 2017-10-12 DIAGNOSIS — Z0000 Encounter for general adult medical examination without abnormal findings: Secondary | ICD-10-CM | POA: Diagnosis not present

## 2017-10-12 DIAGNOSIS — E1142 Type 2 diabetes mellitus with diabetic polyneuropathy: Secondary | ICD-10-CM | POA: Diagnosis not present

## 2017-10-12 DIAGNOSIS — I10 Essential (primary) hypertension: Secondary | ICD-10-CM | POA: Diagnosis not present

## 2017-10-12 DIAGNOSIS — K219 Gastro-esophageal reflux disease without esophagitis: Secondary | ICD-10-CM | POA: Diagnosis not present

## 2017-10-12 DIAGNOSIS — E1165 Type 2 diabetes mellitus with hyperglycemia: Secondary | ICD-10-CM | POA: Diagnosis not present

## 2017-10-22 DIAGNOSIS — E119 Type 2 diabetes mellitus without complications: Secondary | ICD-10-CM | POA: Diagnosis not present

## 2017-10-22 DIAGNOSIS — Z6835 Body mass index (BMI) 35.0-35.9, adult: Secondary | ICD-10-CM | POA: Diagnosis not present

## 2017-10-22 DIAGNOSIS — E785 Hyperlipidemia, unspecified: Secondary | ICD-10-CM | POA: Diagnosis not present

## 2017-10-22 DIAGNOSIS — E1142 Type 2 diabetes mellitus with diabetic polyneuropathy: Secondary | ICD-10-CM | POA: Diagnosis not present

## 2017-10-22 DIAGNOSIS — Z72820 Sleep deprivation: Secondary | ICD-10-CM | POA: Diagnosis not present

## 2018-01-11 DIAGNOSIS — I10 Essential (primary) hypertension: Secondary | ICD-10-CM | POA: Diagnosis not present

## 2018-01-11 DIAGNOSIS — I2510 Atherosclerotic heart disease of native coronary artery without angina pectoris: Secondary | ICD-10-CM | POA: Diagnosis not present

## 2018-01-11 DIAGNOSIS — Z9861 Coronary angioplasty status: Secondary | ICD-10-CM | POA: Diagnosis not present

## 2018-01-11 DIAGNOSIS — E785 Hyperlipidemia, unspecified: Secondary | ICD-10-CM | POA: Diagnosis not present

## 2018-04-11 DIAGNOSIS — E1142 Type 2 diabetes mellitus with diabetic polyneuropathy: Secondary | ICD-10-CM | POA: Diagnosis not present

## 2018-04-11 DIAGNOSIS — E7800 Pure hypercholesterolemia, unspecified: Secondary | ICD-10-CM | POA: Diagnosis not present

## 2018-04-11 DIAGNOSIS — E1165 Type 2 diabetes mellitus with hyperglycemia: Secondary | ICD-10-CM | POA: Diagnosis not present

## 2018-04-18 DIAGNOSIS — E1165 Type 2 diabetes mellitus with hyperglycemia: Secondary | ICD-10-CM | POA: Diagnosis not present

## 2018-04-18 DIAGNOSIS — K219 Gastro-esophageal reflux disease without esophagitis: Secondary | ICD-10-CM | POA: Diagnosis not present

## 2018-04-18 DIAGNOSIS — I10 Essential (primary) hypertension: Secondary | ICD-10-CM | POA: Diagnosis not present

## 2018-04-18 DIAGNOSIS — E7800 Pure hypercholesterolemia, unspecified: Secondary | ICD-10-CM | POA: Diagnosis not present

## 2018-04-18 DIAGNOSIS — E669 Obesity, unspecified: Secondary | ICD-10-CM | POA: Diagnosis not present

## 2018-04-18 DIAGNOSIS — E1142 Type 2 diabetes mellitus with diabetic polyneuropathy: Secondary | ICD-10-CM | POA: Diagnosis not present

## 2018-04-26 DIAGNOSIS — R809 Proteinuria, unspecified: Secondary | ICD-10-CM | POA: Diagnosis not present

## 2018-04-26 DIAGNOSIS — I10 Essential (primary) hypertension: Secondary | ICD-10-CM | POA: Diagnosis not present

## 2018-04-26 DIAGNOSIS — E1159 Type 2 diabetes mellitus with other circulatory complications: Secondary | ICD-10-CM | POA: Diagnosis not present

## 2018-04-26 DIAGNOSIS — E785 Hyperlipidemia, unspecified: Secondary | ICD-10-CM | POA: Diagnosis not present

## 2018-04-26 DIAGNOSIS — E1169 Type 2 diabetes mellitus with other specified complication: Secondary | ICD-10-CM | POA: Diagnosis not present

## 2018-04-26 DIAGNOSIS — E1129 Type 2 diabetes mellitus with other diabetic kidney complication: Secondary | ICD-10-CM | POA: Diagnosis not present

## 2018-07-14 DIAGNOSIS — R0602 Shortness of breath: Secondary | ICD-10-CM | POA: Diagnosis not present

## 2018-07-14 DIAGNOSIS — Z9861 Coronary angioplasty status: Secondary | ICD-10-CM | POA: Diagnosis not present

## 2018-07-14 DIAGNOSIS — E785 Hyperlipidemia, unspecified: Secondary | ICD-10-CM | POA: Diagnosis not present

## 2018-07-14 DIAGNOSIS — E1159 Type 2 diabetes mellitus with other circulatory complications: Secondary | ICD-10-CM | POA: Diagnosis not present

## 2018-07-14 DIAGNOSIS — I10 Essential (primary) hypertension: Secondary | ICD-10-CM | POA: Diagnosis not present

## 2018-07-14 DIAGNOSIS — I2510 Atherosclerotic heart disease of native coronary artery without angina pectoris: Secondary | ICD-10-CM | POA: Diagnosis not present

## 2018-07-14 DIAGNOSIS — E1169 Type 2 diabetes mellitus with other specified complication: Secondary | ICD-10-CM | POA: Diagnosis not present

## 2018-07-19 DIAGNOSIS — E1159 Type 2 diabetes mellitus with other circulatory complications: Secondary | ICD-10-CM | POA: Diagnosis not present

## 2018-07-19 DIAGNOSIS — R809 Proteinuria, unspecified: Secondary | ICD-10-CM | POA: Diagnosis not present

## 2018-07-19 DIAGNOSIS — I10 Essential (primary) hypertension: Secondary | ICD-10-CM | POA: Diagnosis not present

## 2018-07-19 DIAGNOSIS — E1169 Type 2 diabetes mellitus with other specified complication: Secondary | ICD-10-CM | POA: Diagnosis not present

## 2018-07-19 DIAGNOSIS — E785 Hyperlipidemia, unspecified: Secondary | ICD-10-CM | POA: Diagnosis not present

## 2018-07-19 DIAGNOSIS — E1129 Type 2 diabetes mellitus with other diabetic kidney complication: Secondary | ICD-10-CM | POA: Diagnosis not present

## 2018-07-19 DIAGNOSIS — E119 Type 2 diabetes mellitus without complications: Secondary | ICD-10-CM | POA: Diagnosis not present

## 2018-08-11 DIAGNOSIS — I2510 Atherosclerotic heart disease of native coronary artery without angina pectoris: Secondary | ICD-10-CM | POA: Diagnosis not present

## 2018-08-11 DIAGNOSIS — E785 Hyperlipidemia, unspecified: Secondary | ICD-10-CM | POA: Diagnosis not present

## 2018-08-11 DIAGNOSIS — I159 Secondary hypertension, unspecified: Secondary | ICD-10-CM | POA: Diagnosis not present

## 2018-10-12 DIAGNOSIS — E7800 Pure hypercholesterolemia, unspecified: Secondary | ICD-10-CM | POA: Diagnosis not present

## 2018-10-19 DIAGNOSIS — Z0000 Encounter for general adult medical examination without abnormal findings: Secondary | ICD-10-CM | POA: Diagnosis not present

## 2018-10-19 DIAGNOSIS — Z125 Encounter for screening for malignant neoplasm of prostate: Secondary | ICD-10-CM | POA: Diagnosis not present

## 2018-10-19 DIAGNOSIS — I10 Essential (primary) hypertension: Secondary | ICD-10-CM | POA: Diagnosis not present

## 2018-10-19 DIAGNOSIS — K219 Gastro-esophageal reflux disease without esophagitis: Secondary | ICD-10-CM | POA: Diagnosis not present

## 2018-10-19 DIAGNOSIS — E7800 Pure hypercholesterolemia, unspecified: Secondary | ICD-10-CM | POA: Diagnosis not present

## 2018-10-19 DIAGNOSIS — E669 Obesity, unspecified: Secondary | ICD-10-CM | POA: Diagnosis not present

## 2018-10-19 DIAGNOSIS — E1142 Type 2 diabetes mellitus with diabetic polyneuropathy: Secondary | ICD-10-CM | POA: Diagnosis not present

## 2019-01-17 DIAGNOSIS — E1159 Type 2 diabetes mellitus with other circulatory complications: Secondary | ICD-10-CM | POA: Diagnosis not present

## 2019-01-17 DIAGNOSIS — E1169 Type 2 diabetes mellitus with other specified complication: Secondary | ICD-10-CM | POA: Diagnosis not present

## 2019-01-17 DIAGNOSIS — E669 Obesity, unspecified: Secondary | ICD-10-CM | POA: Diagnosis not present

## 2019-01-17 DIAGNOSIS — I10 Essential (primary) hypertension: Secondary | ICD-10-CM | POA: Diagnosis not present

## 2019-01-17 DIAGNOSIS — E1129 Type 2 diabetes mellitus with other diabetic kidney complication: Secondary | ICD-10-CM | POA: Diagnosis not present

## 2019-01-17 DIAGNOSIS — R809 Proteinuria, unspecified: Secondary | ICD-10-CM | POA: Diagnosis not present

## 2019-01-17 DIAGNOSIS — E785 Hyperlipidemia, unspecified: Secondary | ICD-10-CM | POA: Diagnosis not present

## 2019-01-17 DIAGNOSIS — Z6835 Body mass index (BMI) 35.0-35.9, adult: Secondary | ICD-10-CM | POA: Diagnosis not present

## 2019-03-14 DIAGNOSIS — Z9889 Other specified postprocedural states: Secondary | ICD-10-CM | POA: Diagnosis not present

## 2019-03-14 DIAGNOSIS — I10 Essential (primary) hypertension: Secondary | ICD-10-CM | POA: Diagnosis not present

## 2019-03-14 DIAGNOSIS — E1159 Type 2 diabetes mellitus with other circulatory complications: Secondary | ICD-10-CM | POA: Diagnosis not present

## 2019-03-14 DIAGNOSIS — E785 Hyperlipidemia, unspecified: Secondary | ICD-10-CM | POA: Diagnosis not present

## 2019-04-17 DIAGNOSIS — E7800 Pure hypercholesterolemia, unspecified: Secondary | ICD-10-CM | POA: Diagnosis not present

## 2019-04-17 DIAGNOSIS — Z125 Encounter for screening for malignant neoplasm of prostate: Secondary | ICD-10-CM | POA: Diagnosis not present

## 2019-04-21 DIAGNOSIS — Z6835 Body mass index (BMI) 35.0-35.9, adult: Secondary | ICD-10-CM | POA: Diagnosis not present

## 2019-04-21 DIAGNOSIS — E1142 Type 2 diabetes mellitus with diabetic polyneuropathy: Secondary | ICD-10-CM | POA: Diagnosis not present

## 2019-04-21 DIAGNOSIS — I10 Essential (primary) hypertension: Secondary | ICD-10-CM | POA: Diagnosis not present

## 2019-04-21 DIAGNOSIS — K219 Gastro-esophageal reflux disease without esophagitis: Secondary | ICD-10-CM | POA: Diagnosis not present

## 2019-04-21 DIAGNOSIS — E7800 Pure hypercholesterolemia, unspecified: Secondary | ICD-10-CM | POA: Diagnosis not present

## 2019-04-21 DIAGNOSIS — Z87891 Personal history of nicotine dependence: Secondary | ICD-10-CM | POA: Diagnosis not present

## 2019-06-02 DIAGNOSIS — E669 Obesity, unspecified: Secondary | ICD-10-CM | POA: Diagnosis not present

## 2019-06-02 DIAGNOSIS — I10 Essential (primary) hypertension: Secondary | ICD-10-CM | POA: Diagnosis not present

## 2019-06-02 DIAGNOSIS — G4733 Obstructive sleep apnea (adult) (pediatric): Secondary | ICD-10-CM | POA: Diagnosis not present

## 2019-06-02 DIAGNOSIS — Z87891 Personal history of nicotine dependence: Secondary | ICD-10-CM | POA: Diagnosis not present

## 2019-06-02 DIAGNOSIS — Z6835 Body mass index (BMI) 35.0-35.9, adult: Secondary | ICD-10-CM | POA: Diagnosis not present

## 2019-06-26 DIAGNOSIS — Z87891 Personal history of nicotine dependence: Secondary | ICD-10-CM | POA: Diagnosis not present

## 2019-06-26 DIAGNOSIS — M5416 Radiculopathy, lumbar region: Secondary | ICD-10-CM | POA: Diagnosis not present

## 2019-07-04 DIAGNOSIS — G4733 Obstructive sleep apnea (adult) (pediatric): Secondary | ICD-10-CM | POA: Diagnosis not present

## 2019-07-12 ENCOUNTER — Other Ambulatory Visit: Payer: Self-pay | Admitting: Physical Medicine and Rehabilitation

## 2019-07-12 DIAGNOSIS — G8929 Other chronic pain: Secondary | ICD-10-CM | POA: Diagnosis not present

## 2019-07-12 DIAGNOSIS — M5416 Radiculopathy, lumbar region: Secondary | ICD-10-CM

## 2019-07-12 DIAGNOSIS — R1031 Right lower quadrant pain: Secondary | ICD-10-CM | POA: Diagnosis not present

## 2019-07-12 DIAGNOSIS — M1611 Unilateral primary osteoarthritis, right hip: Secondary | ICD-10-CM | POA: Diagnosis not present

## 2019-07-12 DIAGNOSIS — M5442 Lumbago with sciatica, left side: Secondary | ICD-10-CM | POA: Diagnosis not present

## 2019-07-12 DIAGNOSIS — M545 Low back pain: Secondary | ICD-10-CM | POA: Diagnosis not present

## 2019-07-12 DIAGNOSIS — M5136 Other intervertebral disc degeneration, lumbar region: Secondary | ICD-10-CM | POA: Diagnosis not present

## 2019-07-12 DIAGNOSIS — M5441 Lumbago with sciatica, right side: Secondary | ICD-10-CM | POA: Diagnosis not present

## 2019-07-12 DIAGNOSIS — M4807 Spinal stenosis, lumbosacral region: Secondary | ICD-10-CM | POA: Diagnosis not present

## 2019-07-13 DIAGNOSIS — G4733 Obstructive sleep apnea (adult) (pediatric): Secondary | ICD-10-CM | POA: Diagnosis not present

## 2019-07-21 DIAGNOSIS — E1159 Type 2 diabetes mellitus with other circulatory complications: Secondary | ICD-10-CM | POA: Diagnosis not present

## 2019-07-21 DIAGNOSIS — E669 Obesity, unspecified: Secondary | ICD-10-CM | POA: Diagnosis not present

## 2019-07-21 DIAGNOSIS — I10 Essential (primary) hypertension: Secondary | ICD-10-CM | POA: Diagnosis not present

## 2019-07-21 DIAGNOSIS — E1129 Type 2 diabetes mellitus with other diabetic kidney complication: Secondary | ICD-10-CM | POA: Diagnosis not present

## 2019-07-21 DIAGNOSIS — E785 Hyperlipidemia, unspecified: Secondary | ICD-10-CM | POA: Diagnosis not present

## 2019-07-21 DIAGNOSIS — R809 Proteinuria, unspecified: Secondary | ICD-10-CM | POA: Diagnosis not present

## 2019-07-21 DIAGNOSIS — E1169 Type 2 diabetes mellitus with other specified complication: Secondary | ICD-10-CM | POA: Diagnosis not present

## 2019-07-24 ENCOUNTER — Ambulatory Visit
Admission: RE | Admit: 2019-07-24 | Discharge: 2019-07-24 | Disposition: A | Discharge status: Home / Self Care | Payer: Medicare HMO | Source: Ambulatory Visit | Attending: Physical Medicine and Rehabilitation | Admitting: Physical Medicine and Rehabilitation

## 2019-07-24 ENCOUNTER — Other Ambulatory Visit: Payer: Self-pay

## 2019-07-24 DIAGNOSIS — M48061 Spinal stenosis, lumbar region without neurogenic claudication: Secondary | ICD-10-CM | POA: Diagnosis not present

## 2019-07-24 DIAGNOSIS — M5416 Radiculopathy, lumbar region: Secondary | ICD-10-CM | POA: Insufficient documentation

## 2019-07-25 ENCOUNTER — Ambulatory Visit
Admission: RE | Admit: 2019-07-25 | Discharge: 2019-07-25 | Disposition: A | Discharge status: Home / Self Care | Payer: Medicare HMO | Source: Ambulatory Visit | Attending: Physical Medicine and Rehabilitation | Admitting: Physical Medicine and Rehabilitation

## 2019-07-25 ENCOUNTER — Other Ambulatory Visit: Payer: Self-pay | Admitting: Physical Medicine and Rehabilitation

## 2019-07-25 DIAGNOSIS — M4804 Spinal stenosis, thoracic region: Secondary | ICD-10-CM | POA: Insufficient documentation

## 2019-07-25 DIAGNOSIS — G9589 Other specified diseases of spinal cord: Secondary | ICD-10-CM | POA: Diagnosis not present

## 2019-08-01 ENCOUNTER — Ambulatory Visit
Admission: RE | Admit: 2019-08-01 | Discharge: 2019-08-01 | Disposition: A | Discharge status: Home / Self Care | Payer: Medicare HMO | Source: Ambulatory Visit | Attending: Neurosurgery | Admitting: Neurosurgery

## 2019-08-01 ENCOUNTER — Other Ambulatory Visit: Payer: Self-pay | Admitting: Neurosurgery

## 2019-08-01 DIAGNOSIS — M4186 Other forms of scoliosis, lumbar region: Secondary | ICD-10-CM | POA: Diagnosis not present

## 2019-08-01 DIAGNOSIS — M4125 Other idiopathic scoliosis, thoracolumbar region: Secondary | ICD-10-CM | POA: Insufficient documentation

## 2019-08-02 ENCOUNTER — Other Ambulatory Visit: Payer: Self-pay | Admitting: Neurosurgery

## 2019-08-02 DIAGNOSIS — M4714 Other spondylosis with myelopathy, thoracic region: Secondary | ICD-10-CM | POA: Diagnosis not present

## 2019-08-07 ENCOUNTER — Other Ambulatory Visit: Admission: RE | Admit: 2019-08-07 | Payer: Medicare HMO | Source: Ambulatory Visit

## 2019-08-07 ENCOUNTER — Other Ambulatory Visit
Admission: RE | Admit: 2019-08-07 | Discharge: 2019-08-07 | Disposition: A | Discharge status: Home / Self Care | Payer: Medicare HMO | Source: Ambulatory Visit | Attending: Neurosurgery | Admitting: Neurosurgery

## 2019-08-07 ENCOUNTER — Other Ambulatory Visit: Payer: Self-pay

## 2019-08-07 ENCOUNTER — Encounter
Admission: RE | Admit: 2019-08-07 | Discharge: 2019-08-07 | Disposition: A | Discharge status: Home / Self Care | Payer: Medicare HMO | Source: Ambulatory Visit | Attending: Neurosurgery | Admitting: Neurosurgery

## 2019-08-07 DIAGNOSIS — E785 Hyperlipidemia, unspecified: Secondary | ICD-10-CM | POA: Diagnosis not present

## 2019-08-07 DIAGNOSIS — M4804 Spinal stenosis, thoracic region: Secondary | ICD-10-CM | POA: Diagnosis present

## 2019-08-07 DIAGNOSIS — I10 Essential (primary) hypertension: Secondary | ICD-10-CM | POA: Insufficient documentation

## 2019-08-07 DIAGNOSIS — K219 Gastro-esophageal reflux disease without esophagitis: Secondary | ICD-10-CM | POA: Diagnosis not present

## 2019-08-07 DIAGNOSIS — M5104 Intervertebral disc disorders with myelopathy, thoracic region: Secondary | ICD-10-CM | POA: Diagnosis present

## 2019-08-07 DIAGNOSIS — D649 Anemia, unspecified: Secondary | ICD-10-CM | POA: Diagnosis not present

## 2019-08-07 DIAGNOSIS — Z20822 Contact with and (suspected) exposure to covid-19: Secondary | ICD-10-CM | POA: Diagnosis present

## 2019-08-07 DIAGNOSIS — M4324 Fusion of spine, thoracic region: Secondary | ICD-10-CM | POA: Diagnosis not present

## 2019-08-07 DIAGNOSIS — R9431 Abnormal electrocardiogram [ECG] [EKG]: Secondary | ICD-10-CM | POA: Insufficient documentation

## 2019-08-07 DIAGNOSIS — M4714 Other spondylosis with myelopathy, thoracic region: Secondary | ICD-10-CM | POA: Diagnosis not present

## 2019-08-07 DIAGNOSIS — E119 Type 2 diabetes mellitus without complications: Secondary | ICD-10-CM | POA: Diagnosis not present

## 2019-08-07 DIAGNOSIS — Z01812 Encounter for preprocedural laboratory examination: Secondary | ICD-10-CM | POA: Insufficient documentation

## 2019-08-07 DIAGNOSIS — I2510 Atherosclerotic heart disease of native coronary artery without angina pectoris: Secondary | ICD-10-CM | POA: Diagnosis not present

## 2019-08-07 DIAGNOSIS — G959 Disease of spinal cord, unspecified: Secondary | ICD-10-CM | POA: Diagnosis not present

## 2019-08-07 LAB — BASIC METABOLIC PANEL
Anion gap: 11 (ref 5–15)
BUN: 14 mg/dL (ref 8–23)
CO2: 27 mmol/L (ref 22–32)
Calcium: 9.5 mg/dL (ref 8.9–10.3)
Chloride: 101 mmol/L (ref 98–111)
Creatinine, Ser: 1.04 mg/dL (ref 0.61–1.24)
GFR calc Af Amer: >60 mL/min (ref >60)
GFR calc non Af Amer: >60 mL/min (ref >60)
Glucose, Bld: 216 mg/dL — ABNORMAL HIGH (ref 70–99)
Potassium: 3.8 mmol/L (ref 3.5–5.1)
Sodium: 139 mmol/L (ref 135–145)

## 2019-08-07 LAB — URINALYSIS, ROUTINE W REFLEX MICROSCOPIC
Bacteria, UA: NONE SEEN
Bilirubin Urine: NEGATIVE
Glucose, UA: >=500 mg/dL — AB
Hgb urine dipstick: NEGATIVE
Ketones, ur: NEGATIVE mg/dL
Leukocytes,Ua: NEGATIVE
Nitrite: NEGATIVE
Protein, ur: NEGATIVE mg/dL
Specific Gravity, Urine: 1.011 (ref 1.005–1.030)
pH: 6 (ref 5.0–8.0)

## 2019-08-07 LAB — CBC
HCT: 44.6 % (ref 39.0–52.0)
Hemoglobin: 15.5 g/dL (ref 13.0–17.0)
MCH: 31.4 pg (ref 26.0–34.0)
MCHC: 34.8 g/dL (ref 30.0–36.0)
MCV: 90.5 fL (ref 80.0–100.0)
Platelets: 230 10*3/uL (ref 150–400)
RBC: 4.93 MIL/uL (ref 4.22–5.81)
RDW: 12.5 % (ref 11.5–15.5)
WBC: 7.8 10*3/uL (ref 4.0–10.5)
nRBC: 0 % (ref 0.0–0.2)

## 2019-08-07 LAB — PROTIME-INR
INR: 0.9 (ref 0.8–1.2)
Prothrombin Time: 11.8 seconds (ref 11.4–15.2)

## 2019-08-07 LAB — APTT: aPTT: 25 seconds (ref 24–36)

## 2019-08-07 LAB — TYPE AND SCREEN
ABO/RH(D): A POS
Antibody Screen: NEGATIVE

## 2019-08-07 LAB — SURGICAL PCR SCREEN
MRSA, PCR: NEGATIVE
Staphylococcus aureus: NEGATIVE

## 2019-08-07 LAB — SARS CORONAVIRUS 2 (TAT 6-24 HRS): SARS Coronavirus 2: NEGATIVE

## 2019-08-07 NOTE — Pre-Procedure Instructions (Signed)
Contacted Dr. Lillie Fragmin' office to request copy of last EKG for comparison

## 2019-08-07 NOTE — Patient Instructions (Signed)
Your procedure is scheduled on: Wed. 1/6 Report to Day Surgery. To find out your arrival time please call [phone] between 1PM - 3PM on Tues 1/5.  Remember: Instructions that are not followed completely may result in serious medical risk,  up to and including death, or upon the discretion of your surgeon and anesthesiologist your  surgery may need to be rescheduled.     _X__ 1. Do not eat food after midnight the night before your procedure.                 No gum chewing or hard candies. You may drink clear liquids up to 2 hours                 before you are scheduled to arrive for your surgery- DO not drink clear                 liquids within 2 hours of the start of your surgery.                 Clear Liquids include:  water, apple juice without pulp, clear carbohydrate                 drink such as Clearfast of Gatorade, Black Coffee or Tea (Do not add                 anything to coffee or tea).  __X__2.  On the morning of surgery brush your teeth with toothpaste and water, you                may rinse your mouth with mouthwash if you wish.  Do not swallow any toothpaste of mouthwash.     ___ 3.  No Alcohol for 24 hours before or after surgery.   ___ 4.  Do Not Smoke or use e-cigarettes For 24 Hours Prior to Your Surgery.                 Do not use any chewable tobacco products for at least 6 hours prior to                 surgery.  ____  5.  Bring all medications with you on the day of surgery if instructed.   __x__  6.  Notify your doctor if there is any change in your medical condition      (cold, fever, infections).     Do not wear jewelry, make-up, hairpins, clips or nail polish. Do not wear lotions, powders, or perfumes. You may wear deodorant. Do not shave 48 hours prior to surgery. Men may shave face and neck. Do not bring valuables to the hospital.    [facility] is not responsible for any belongings or valuables.  Contacts, dentures or  bridgework may not be worn into surgery. Leave your suitcase in the car. After surgery it may be brought to your room. For patients admitted to the hospital, discharge time is determined by your treatment team.   Patients discharged the day of surgery will not be allowed to drive home.   Please read over the following fact sheets that you were given:    _x___ Take these medicines the morning of surgery with A SIP OF WATER:    1. esomeprazole (NEXIUM) 20 MG capsule  The night before and the morning of surgery  2.   3.   4.  5.  6.  ____ Fleet Enema (as directed)   __x__  Use CHG Soap as directed  ____ Use inhalers on the day of surgery  _x___ Stop metformin today    ____ Take 1/2 of usual insulin dose the night before surgery. No insulin the morning          of surgery.   __x__ Stop aspirin today   ___x_ Stop Anti-inflammatories no ibuprofen or aleve   ___x_ Stop supplements until after surgery.  omega-3 acid ethyl esters (LOVAZA) 1 g capsule,CINNAMON PO,ascorbic acid (VITAMIN C) 1000 MG tablet  ____ Bring C-Pap to the hospital.

## 2019-08-08 MED ORDER — VANCOMYCIN HCL 2000 MG/400ML IV SOLN
2000.0000 mg | INTRAVENOUS | Status: AC
  Administered 2019-08-09: 2000 mg via INTRAVENOUS
  Filled 2019-08-08: qty 400

## 2019-08-08 MED ORDER — CIPROFLOXACIN IN D5W 400 MG/200ML IV SOLN
400.0000 mg | Freq: Two times a day (BID) | INTRAVENOUS | Status: DC
  Administered 2019-08-09: 15:00:00 400 mg via INTRAVENOUS

## 2019-08-09 ENCOUNTER — Inpatient Hospital Stay: Payer: Medicare HMO | Admitting: Registered Nurse

## 2019-08-09 ENCOUNTER — Encounter
Admission: RE | Disposition: A | Discharge status: Home / Self Care | Payer: Self-pay | Source: Home / Self Care | Attending: Neurosurgery

## 2019-08-09 ENCOUNTER — Other Ambulatory Visit: Payer: Self-pay

## 2019-08-09 ENCOUNTER — Inpatient Hospital Stay: Payer: Medicare HMO

## 2019-08-09 ENCOUNTER — Inpatient Hospital Stay
Admission: RE | Admit: 2019-08-09 | Discharge: 2019-08-11 | DRG: 460 | Disposition: A | Discharge status: Home Health | Payer: Medicare HMO | Attending: Neurosurgery | Admitting: Neurosurgery

## 2019-08-09 ENCOUNTER — Encounter: Payer: Self-pay | Admitting: Neurosurgery

## 2019-08-09 DIAGNOSIS — Z20822 Contact with and (suspected) exposure to covid-19: Secondary | ICD-10-CM | POA: Diagnosis present

## 2019-08-09 DIAGNOSIS — M4714 Other spondylosis with myelopathy, thoracic region: Secondary | ICD-10-CM | POA: Diagnosis present

## 2019-08-09 DIAGNOSIS — Z419 Encounter for procedure for purposes other than remedying health state, unspecified: Secondary | ICD-10-CM

## 2019-08-09 DIAGNOSIS — M4804 Spinal stenosis, thoracic region: Secondary | ICD-10-CM | POA: Diagnosis present

## 2019-08-09 DIAGNOSIS — M5104 Intervertebral disc disorders with myelopathy, thoracic region: Principal | ICD-10-CM | POA: Diagnosis present

## 2019-08-09 DIAGNOSIS — M4324 Fusion of spine, thoracic region: Secondary | ICD-10-CM | POA: Diagnosis not present

## 2019-08-09 DIAGNOSIS — Z981 Arthrodesis status: Secondary | ICD-10-CM

## 2019-08-09 HISTORY — PX: THORACIC DISCECTOMY: SHX6113

## 2019-08-09 LAB — GLUCOSE, CAPILLARY
Glucose-Capillary: 137 mg/dL — ABNORMAL HIGH (ref 70–99)
Glucose-Capillary: 154 mg/dL — ABNORMAL HIGH (ref 70–99)
Glucose-Capillary: 184 mg/dL — ABNORMAL HIGH (ref 70–99)

## 2019-08-09 LAB — ABO/RH: ABO/RH(D): A POS

## 2019-08-09 SURGERY — THORACIC DISCECTOMY
Anesthesia: General | Site: Back

## 2019-08-09 MED ORDER — HEPARIN SODIUM (PORCINE) 1000 UNIT/ML IJ SOLN
INTRAMUSCULAR | Status: AC
  Filled 2019-08-09: qty 1

## 2019-08-09 MED ORDER — PROPOFOL 500 MG/50ML IV EMUL
INTRAVENOUS | Status: AC
  Filled 2019-08-09: qty 50

## 2019-08-09 MED ORDER — FENTANYL CITRATE (PF) 100 MCG/2ML IJ SOLN
INTRAMUSCULAR | Status: AC
  Filled 2019-08-09: qty 2

## 2019-08-09 MED ORDER — SUCCINYLCHOLINE CHLORIDE 20 MG/ML IJ SOLN
INTRAMUSCULAR | Status: AC
  Filled 2019-08-09: qty 1

## 2019-08-09 MED ORDER — SODIUM CHLORIDE 0.9% FLUSH
3.0000 mL | Freq: Two times a day (BID) | INTRAVENOUS | Status: DC
  Administered 2019-08-10 – 2019-08-11 (×3): 3 mL via INTRAVENOUS

## 2019-08-09 MED ORDER — DEXAMETHASONE SODIUM PHOSPHATE 10 MG/ML IJ SOLN
INTRAMUSCULAR | Status: AC
  Filled 2019-08-09: qty 1

## 2019-08-09 MED ORDER — FENTANYL CITRATE (PF) 100 MCG/2ML IJ SOLN
25.0000 ug | INTRAMUSCULAR | Status: DC | PRN
  Administered 2019-08-09 (×3): 25 ug via INTRAVENOUS

## 2019-08-09 MED ORDER — KETOROLAC TROMETHAMINE 15 MG/ML IJ SOLN
15.0000 mg | Freq: Three times a day (TID) | INTRAMUSCULAR | Status: AC
  Administered 2019-08-10 (×2): 15 mg via INTRAVENOUS
  Filled 2019-08-09: qty 1

## 2019-08-09 MED ORDER — ONDANSETRON HCL 4 MG/2ML IJ SOLN: 4.0000 mg | Freq: Four times a day (QID) | INTRAMUSCULAR | Status: DC | PRN

## 2019-08-09 MED ORDER — GABAPENTIN 300 MG PO CAPS: 300.0000 mg | ORAL_CAPSULE | Freq: Every day | ORAL | Status: DC

## 2019-08-09 MED ORDER — INSULIN ASPART 100 UNIT/ML ~~LOC~~ SOLN
0.0000 [IU] | Freq: Three times a day (TID) | SUBCUTANEOUS | Status: DC
  Administered 2019-08-11: 2 [IU] via SUBCUTANEOUS

## 2019-08-09 MED ORDER — BUPIVACAINE HCL (PF) 0.5 % IJ SOLN
INTRAMUSCULAR | Status: DC | PRN
  Administered 2019-08-09: 30 mL

## 2019-08-09 MED ORDER — MIDAZOLAM HCL 2 MG/2ML IJ SOLN
INTRAMUSCULAR | Status: DC | PRN
  Administered 2019-08-09: 2 mg via INTRAVENOUS

## 2019-08-09 MED ORDER — METFORMIN HCL 500 MG PO TABS
1000.0000 mg | ORAL_TABLET | Freq: Two times a day (BID) | ORAL | Status: DC
  Filled 2019-08-09: qty 2

## 2019-08-09 MED ORDER — LIDOCAINE HCL (CARDIAC) PF 100 MG/5ML IV SOSY
PREFILLED_SYRINGE | INTRAVENOUS | Status: DC | PRN
  Administered 2019-08-09: 100 mg via INTRAVENOUS

## 2019-08-09 MED ORDER — ACETAMINOPHEN 10 MG/ML IV SOLN
INTRAVENOUS | Status: DC | PRN
  Administered 2019-08-09: 1000 mg via INTRAVENOUS

## 2019-08-09 MED ORDER — KETAMINE HCL 10 MG/ML IJ SOLN
INTRAMUSCULAR | Status: DC | PRN
  Administered 2019-08-09: 20 mg via INTRAVENOUS
  Administered 2019-08-09: 30 mg via INTRAVENOUS

## 2019-08-09 MED ORDER — GABAPENTIN 300 MG PO CAPS
ORAL_CAPSULE | ORAL | Status: AC
  Administered 2019-08-09: 300 mg via ORAL
  Filled 2019-08-09: qty 1

## 2019-08-09 MED ORDER — RAMIPRIL 10 MG PO CAPS
10.0000 mg | ORAL_CAPSULE | Freq: Two times a day (BID) | ORAL | Status: DC
  Administered 2019-08-09 – 2019-08-11 (×4): 10 mg via ORAL
  Filled 2019-08-09 (×6): qty 1

## 2019-08-09 MED ORDER — SODIUM CHLORIDE FLUSH 0.9 % IV SOLN
INTRAVENOUS | Status: AC
  Administered 2019-08-09: 3 mL via INTRAVENOUS
  Filled 2019-08-09: qty 10

## 2019-08-09 MED ORDER — OMEGA-3-ACID ETHYL ESTERS 1 G PO CAPS
1.0000 | ORAL_CAPSULE | Freq: Two times a day (BID) | ORAL | Status: DC
  Administered 2019-08-09 – 2019-08-11 (×4): 1 g via ORAL
  Filled 2019-08-09 (×6): qty 1

## 2019-08-09 MED ORDER — FENTANYL CITRATE (PF) 100 MCG/2ML IJ SOLN
INTRAMUSCULAR | Status: AC
  Administered 2019-08-09: 25 ug via INTRAVENOUS
  Filled 2019-08-09: qty 2

## 2019-08-09 MED ORDER — KETOROLAC TROMETHAMINE 30 MG/ML IJ SOLN
30.0000 mg | Freq: Once | INTRAMUSCULAR | Status: AC
  Filled 2019-08-09: qty 1

## 2019-08-09 MED ORDER — MENTHOL 3 MG MT LOZG
1.0000 | LOZENGE | OROMUCOSAL | Status: DC | PRN
  Filled 2019-08-09: qty 9

## 2019-08-09 MED ORDER — METHOCARBAMOL 1000 MG/10ML IJ SOLN
500.0000 mg | Freq: Four times a day (QID) | INTRAVENOUS | Status: DC
  Administered 2019-08-09: 500 mg via INTRAVENOUS
  Filled 2019-08-09 (×9): qty 5

## 2019-08-09 MED ORDER — FENTANYL CITRATE (PF) 100 MCG/2ML IJ SOLN
INTRAMUSCULAR | Status: DC | PRN
  Administered 2019-08-09: 100 ug via INTRAVENOUS
  Administered 2019-08-09: 50 ug via INTRAVENOUS

## 2019-08-09 MED ORDER — SUCCINYLCHOLINE CHLORIDE 20 MG/ML IJ SOLN
INTRAMUSCULAR | Status: DC | PRN
  Administered 2019-08-09: 100 mg via INTRAVENOUS

## 2019-08-09 MED ORDER — POLYETHYLENE GLYCOL 3350 17 G PO PACK
17.0000 g | PACK | Freq: Every day | ORAL | Status: DC | PRN
  Filled 2019-08-09: qty 1

## 2019-08-09 MED ORDER — SODIUM CHLORIDE 0.9 % IV SOLN
INTRAVENOUS | Status: DC | PRN
  Administered 2019-08-09: 40 mL

## 2019-08-09 MED ORDER — PANTOPRAZOLE SODIUM 40 MG PO TBEC
40.0000 mg | DELAYED_RELEASE_TABLET | Freq: Every day | ORAL | Status: DC
  Administered 2019-08-10 – 2019-08-11 (×2): 40 mg via ORAL
  Filled 2019-08-09 (×2): qty 1

## 2019-08-09 MED ORDER — MIDAZOLAM HCL 2 MG/2ML IJ SOLN
INTRAMUSCULAR | Status: AC
  Filled 2019-08-09: qty 2

## 2019-08-09 MED ORDER — ONDANSETRON HCL 4 MG/2ML IJ SOLN: 4.0000 mg | Freq: Once | INTRAMUSCULAR | Status: DC | PRN

## 2019-08-09 MED ORDER — SENNA 8.6 MG PO TABS
1.0000 | ORAL_TABLET | Freq: Two times a day (BID) | ORAL | Status: DC
  Administered 2019-08-09 – 2019-08-11 (×4): 8.6 mg via ORAL
  Filled 2019-08-09 (×6): qty 1

## 2019-08-09 MED ORDER — SODIUM CHLORIDE 0.9 % IV SOLN: INTRAVENOUS | Status: DC

## 2019-08-09 MED ORDER — CIPROFLOXACIN IN D5W 400 MG/200ML IV SOLN
INTRAVENOUS | Status: AC
  Filled 2019-08-09: qty 200

## 2019-08-09 MED ORDER — ONDANSETRON HCL 4 MG/2ML IJ SOLN
INTRAMUSCULAR | Status: DC | PRN
  Administered 2019-08-09: 4 mg via INTRAVENOUS

## 2019-08-09 MED ORDER — SODIUM CHLORIDE 0.9 % IR SOLN
Status: DC | PRN
  Administered 2019-08-09: 1000 mL

## 2019-08-09 MED ORDER — ACETAMINOPHEN 650 MG RE SUPP: 650.0000 mg | RECTAL | Status: DC | PRN

## 2019-08-09 MED ORDER — PHENYLEPHRINE HCL (PRESSORS) 10 MG/ML IV SOLN
INTRAVENOUS | Status: DC | PRN
  Administered 2019-08-09 (×2): 100 ug via INTRAVENOUS
  Administered 2019-08-09: 50 ug via INTRAVENOUS
  Administered 2019-08-09 (×3): 100 ug via INTRAVENOUS

## 2019-08-09 MED ORDER — ACETAMINOPHEN 10 MG/ML IV SOLN
INTRAVENOUS | Status: AC
  Filled 2019-08-09: qty 100

## 2019-08-09 MED ORDER — OXYCODONE HCL 5 MG PO TABS: 5.0000 mg | ORAL_TABLET | ORAL | Status: DC | PRN

## 2019-08-09 MED ORDER — SODIUM CHLORIDE 0.9% FLUSH: 3.0000 mL | INTRAVENOUS | Status: DC | PRN

## 2019-08-09 MED ORDER — MAGNESIUM CITRATE PO SOLN
1.0000 | Freq: Once | ORAL | Status: DC | PRN
  Filled 2019-08-09: qty 296

## 2019-08-09 MED ORDER — HEPARIN SODIUM (PORCINE) 10000 UNIT/ML IJ SOLN
INTRAMUSCULAR | Status: AC
  Filled 2019-08-09: qty 1

## 2019-08-09 MED ORDER — BISACODYL 5 MG PO TBEC
5.0000 mg | DELAYED_RELEASE_TABLET | Freq: Every day | ORAL | Status: DC | PRN
  Filled 2019-08-09: qty 1

## 2019-08-09 MED ORDER — PHENOL 1.4 % MT LIQD
1.0000 | OROMUCOSAL | Status: DC | PRN
  Filled 2019-08-09: qty 177

## 2019-08-09 MED ORDER — PROPOFOL 10 MG/ML IV BOLUS
INTRAVENOUS | Status: DC | PRN
  Administered 2019-08-09: 20 mg via INTRAVENOUS
  Administered 2019-08-09: 30 mg via INTRAVENOUS
  Administered 2019-08-09: 120 mg via INTRAVENOUS

## 2019-08-09 MED ORDER — ONDANSETRON HCL 4 MG/2ML IJ SOLN
INTRAMUSCULAR | Status: AC
  Filled 2019-08-09: qty 2

## 2019-08-09 MED ORDER — PROPOFOL 10 MG/ML IV BOLUS
INTRAVENOUS | Status: AC
  Filled 2019-08-09: qty 20

## 2019-08-09 MED ORDER — ACETAMINOPHEN 500 MG PO TABS
ORAL_TABLET | ORAL | Status: AC
  Administered 2019-08-09: 1000 mg via ORAL
  Filled 2019-08-09: qty 2

## 2019-08-09 MED ORDER — ROCURONIUM BROMIDE 100 MG/10ML IV SOLN
INTRAVENOUS | Status: DC | PRN
  Administered 2019-08-09: 5 mg via INTRAVENOUS

## 2019-08-09 MED ORDER — ATORVASTATIN CALCIUM 20 MG PO TABS
20.0000 mg | ORAL_TABLET | Freq: Every day | ORAL | Status: DC
  Administered 2019-08-09 – 2019-08-10 (×2): 20 mg via ORAL
  Filled 2019-08-09 (×3): qty 1

## 2019-08-09 MED ORDER — ACETAMINOPHEN 500 MG PO TABS
1000.0000 mg | ORAL_TABLET | Freq: Four times a day (QID) | ORAL | Status: AC
  Administered 2019-08-10 (×2): 1000 mg via ORAL

## 2019-08-09 MED ORDER — ROCURONIUM BROMIDE 50 MG/5ML IV SOLN
INTRAVENOUS | Status: AC
  Filled 2019-08-09: qty 1

## 2019-08-09 MED ORDER — METHOCARBAMOL 500 MG PO TABS
1000.0000 mg | ORAL_TABLET | Freq: Four times a day (QID) | ORAL | Status: DC
  Administered 2019-08-10 – 2019-08-11 (×2): 1000 mg via ORAL
  Filled 2019-08-09 (×2): qty 2

## 2019-08-09 MED ORDER — KETOROLAC TROMETHAMINE 30 MG/ML IJ SOLN
INTRAMUSCULAR | Status: AC
  Administered 2019-08-09: 30 mg via INTRAVENOUS
  Filled 2019-08-09: qty 1

## 2019-08-09 MED ORDER — SODIUM CHLORIDE 0.9 % IV SOLN: 250.0000 mL | INTRAVENOUS | Status: DC

## 2019-08-09 MED ORDER — AMLODIPINE BESYLATE 10 MG PO TABS
10.0000 mg | ORAL_TABLET | Freq: Every day | ORAL | Status: DC
  Administered 2019-08-09 – 2019-08-10 (×2): 10 mg via ORAL
  Filled 2019-08-09 (×3): qty 1

## 2019-08-09 MED ORDER — OXYCODONE HCL 5 MG PO TABS: 10.0000 mg | ORAL_TABLET | ORAL | Status: DC | PRN

## 2019-08-09 MED ORDER — LIDOCAINE HCL (PF) 2 % IJ SOLN
INTRAMUSCULAR | Status: AC
  Filled 2019-08-09: qty 10

## 2019-08-09 MED ORDER — PROPOFOL 500 MG/50ML IV EMUL
INTRAVENOUS | Status: DC | PRN
  Administered 2019-08-09: 125 ug/kg/min via INTRAVENOUS

## 2019-08-09 MED ORDER — PHENYLEPHRINE HCL (PRESSORS) 10 MG/ML IV SOLN
INTRAVENOUS | Status: AC
  Filled 2019-08-09: qty 1

## 2019-08-09 MED ORDER — ONDANSETRON HCL 4 MG PO TABS: 4.0000 mg | ORAL_TABLET | Freq: Four times a day (QID) | ORAL | Status: DC | PRN

## 2019-08-09 MED ORDER — BUPIVACAINE-EPINEPHRINE 0.5% -1:200000 IJ SOLN
INTRAMUSCULAR | Status: DC | PRN
  Administered 2019-08-09: 3 mL

## 2019-08-09 MED ORDER — HYDROCHLOROTHIAZIDE 25 MG PO TABS
25.0000 mg | ORAL_TABLET | Freq: Every day | ORAL | Status: DC
  Administered 2019-08-09 – 2019-08-11 (×3): 25 mg via ORAL
  Filled 2019-08-09 (×3): qty 1

## 2019-08-09 MED ORDER — GLIPIZIDE 5 MG PO TABS
5.0000 mg | ORAL_TABLET | Freq: Every day | ORAL | Status: DC
  Administered 2019-08-09: 5 mg via ORAL
  Filled 2019-08-09 (×2): qty 1

## 2019-08-09 MED ORDER — DEXAMETHASONE SODIUM PHOSPHATE 10 MG/ML IJ SOLN
INTRAMUSCULAR | Status: DC | PRN
  Administered 2019-08-09: 4 mg via INTRAVENOUS

## 2019-08-09 MED ORDER — THROMBIN 5000 UNITS EX SOLR
CUTANEOUS | Status: DC | PRN
  Administered 2019-08-09: 5000 [IU] via TOPICAL

## 2019-08-09 MED ORDER — ACETAMINOPHEN 325 MG PO TABS: 650.0000 mg | ORAL_TABLET | ORAL | Status: DC | PRN

## 2019-08-09 MED ORDER — PROPOFOL 10 MG/ML IV BOLUS
INTRAVENOUS | Status: AC
  Filled 2019-08-09: qty 40

## 2019-08-09 MED ORDER — HYDROMORPHONE HCL 1 MG/ML IJ SOLN: 0.5000 mg | INTRAMUSCULAR | Status: DC | PRN

## 2019-08-09 SURGICAL SUPPLY — 81 items
BUR NEURO DRILL SOFT 3.0X3.8M (BURR) ×3 IMPLANT
CANISTER SUCT 1200ML W/VALVE (MISCELLANEOUS) ×6 IMPLANT
CAP LOCKING THREADED (Cap) ×8 IMPLANT
CHLORAPREP W/TINT 26 (MISCELLANEOUS) ×12 IMPLANT
CORD BIP STRL DISP 12FT (MISCELLANEOUS) ×3 IMPLANT
COUNTER NEEDLE 20/40 LG (NEEDLE) ×3 IMPLANT
COVER BACK TABLE REUSABLE LG (DRAPES) ×3 IMPLANT
COVER LIGHT HANDLE STERIS (MISCELLANEOUS) ×12 IMPLANT
COVER WAND RF STERILE (DRAPES) ×3 IMPLANT
CRADLE LAMINECT ARM (MISCELLANEOUS) ×6 IMPLANT
CUP MEDICINE 2OZ PLAST GRAD ST (MISCELLANEOUS) ×3 IMPLANT
DERMABOND ADVANCED (GAUZE/BANDAGES/DRESSINGS) ×4
DERMABOND ADVANCED .7 DNX12 (GAUZE/BANDAGES/DRESSINGS) ×2 IMPLANT
DRAPE C-ARM 42X72 X-RAY (DRAPES) ×6 IMPLANT
DRAPE C-ARMOR (DRAPES) ×6 IMPLANT
DRAPE INCISE IOBAN 66X45 STRL (DRAPES) ×6 IMPLANT
DRAPE LAPAROTOMY 100X77 ABD (DRAPES) ×6 IMPLANT
DRAPE MICROSCOPE SPINE 48X150 (DRAPES) ×3 IMPLANT
DRAPE POUCH INSTRU U-SHP 10X18 (DRAPES) ×3 IMPLANT
DRAPE SURG 17X11 SM STRL (DRAPES) ×24 IMPLANT
DRSG OPSITE POSTOP 4X6 (GAUZE/BANDAGES/DRESSINGS) IMPLANT
DRSG TEGADERM 2-3/8X2-3/4 SM (GAUZE/BANDAGES/DRESSINGS) ×2 IMPLANT
DRSG TEGADERM 4X4.75 (GAUZE/BANDAGES/DRESSINGS) IMPLANT
DRSG TEGADERM 6X8 (GAUZE/BANDAGES/DRESSINGS) IMPLANT
DRSG TELFA 3X8 NADH (GAUZE/BANDAGES/DRESSINGS) IMPLANT
DRSG TELFA 4X3 1S NADH ST (GAUZE/BANDAGES/DRESSINGS) IMPLANT
ELECT CAUTERY BLADE TIP 2.5 (TIP) ×6
ELECT EZSTD 165MM 6.5IN (MISCELLANEOUS) ×3
ELECT REM PT RETURN 9FT ADLT (ELECTROSURGICAL) ×6
ELECTRODE CAUTERY BLDE TIP 2.5 (TIP) ×2 IMPLANT
ELECTRODE EZSTD 165MM 6.5IN (MISCELLANEOUS) ×1 IMPLANT
ELECTRODE REM PT RTRN 9FT ADLT (ELECTROSURGICAL) ×2 IMPLANT
FEE INTRAOP MONITOR IMPULS NCS (MISCELLANEOUS) IMPLANT
FRAME EYE SHIELD (PROTECTIVE WEAR) ×6 IMPLANT
GLOVE BIOGEL PI IND STRL 7.0 (GLOVE) ×2 IMPLANT
GLOVE BIOGEL PI INDICATOR 7.0 (GLOVE) ×4
GLOVE SURG SYN 7.0 (GLOVE) ×12 IMPLANT
GLOVE SURG SYN 7.0 PF PI (GLOVE) ×4 IMPLANT
GLOVE SURG SYN 8.5  E (GLOVE) ×12
GLOVE SURG SYN 8.5 E (GLOVE) ×6 IMPLANT
GLOVE SURG SYN 8.5 PF PI (GLOVE) ×6 IMPLANT
GOWN SRG XL LVL 3 NONREINFORCE (GOWNS) ×2 IMPLANT
GOWN STRL NON-REIN TWL XL LVL3 (GOWNS) ×4
GOWN STRL REUS W/TWL MED LVL3 (GOWN DISPOSABLE) ×6 IMPLANT
GRADUATE 1200CC STRL 31836 (MISCELLANEOUS) ×3 IMPLANT
INTRAOP MONITOR FEE IMPULS NCS (MISCELLANEOUS)
INTRAOP MONITOR FEE IMPULSE (MISCELLANEOUS)
KIT PREVENA INCISION MGT20CM45 (CANNISTER) ×2 IMPLANT
KIT SPINAL PRONEVIEW (KITS) ×3 IMPLANT
KIT TURNOVER KIT A (KITS) ×3 IMPLANT
KNIFE BAYONET SHORT DISCETOMY (MISCELLANEOUS) IMPLANT
LINER MARATHON 4 32 IDXX54OD (Hips) ×2 IMPLANT
MARKER SKIN DUAL TIP RULER LAB (MISCELLANEOUS) ×9 IMPLANT
NDL SAFETY ECLIPSE 18X1.5 (NEEDLE) ×1 IMPLANT
NEEDLE HYPO 18GX1.5 SHARP (NEEDLE) ×2
NEEDLE HYPO 22GX1.5 SAFETY (NEEDLE) ×3 IMPLANT
PACK LAMINECTOMY NEURO (CUSTOM PROCEDURE TRAY) ×3 IMPLANT
PAD ARMBOARD 7.5X6 YLW CONV (MISCELLANEOUS) ×3 IMPLANT
PAD DRESSING TELFA 3X8 NADH (GAUZE/BANDAGES/DRESSINGS) IMPLANT
PENCIL ELECTRO HAND CTR (MISCELLANEOUS) ×3 IMPLANT
PUTTY DBM PROPEL SM (Putty) ×2 IMPLANT
ROD STRAIGHT 45MM (Rod) ×2 IMPLANT
SCREW 6.5X45 (Screw) ×6 IMPLANT
SCREW SPINE CREO 6.5X50 (Screw) ×4 IMPLANT
SLEEVE PROTECTION STRL DISP (MISCELLANEOUS) ×2 IMPLANT
SPOGE SURGIFLO 8M (HEMOSTASIS) ×2
SPONGE GAUZE 2X2 8PLY STER LF (GAUZE/BANDAGES/DRESSINGS) ×1
SPONGE GAUZE 2X2 8PLY STRL LF (GAUZE/BANDAGES/DRESSINGS) ×1 IMPLANT
SPONGE SURGIFLO 8M (HEMOSTASIS) ×1 IMPLANT
STAPLER SKIN PROX 35W (STAPLE) IMPLANT
SUT DVC VLOC 3-0 CL 6 P-12 (SUTURE) ×3 IMPLANT
SUT ETHILON 3-0 FS-10 30 BLK (SUTURE)
SUT VIC AB 0 CT1 27 (SUTURE) ×4
SUT VIC AB 0 CT1 27XCR 8 STRN (SUTURE) ×1 IMPLANT
SUT VIC AB 2-0 CT1 18 (SUTURE) ×9 IMPLANT
SUTURE EHLN 3-0 FS-10 30 BLK (SUTURE) IMPLANT
SYR 30ML LL (SYRINGE) ×6 IMPLANT
TOWEL OR 17X26 4PK STRL BLUE (TOWEL DISPOSABLE) ×9 IMPLANT
TRAY FOLEY MTR SLVR 16FR STAT (SET/KITS/TRAYS/PACK) IMPLANT
TUBING CONNECTING 10 (TUBING) ×6 IMPLANT
TUBING CONNECTING 10' (TUBING) ×3

## 2019-08-09 NOTE — H&P (Signed)
I have reviewed and confirmed my history and physical from 08/03/2019 with no additions or changes. Plan for T11-12 transpedicular decompression/discectomy with T11-12 posterior fusion.  Risks and benefits reviewed.  Heart sounds normal no MRG. Chest Clear to Auscultation Bilaterally.

## 2019-08-09 NOTE — OR Nursing (Signed)
Patient noted to have a variable HR . Dr. Baruch Merl notified and 12 lead EKG obtained. He has reviewed the EKG and reports patient is okay to proceed with surgery.

## 2019-08-09 NOTE — Anesthesia Preprocedure Evaluation (Signed)
Anesthesia Evaluation  Patient identified by MRN, date of birth, ID band Patient awake    Reviewed: Allergy & Precautions, H&P , NPO status , Patient's Chart, lab work & pertinent test results, reviewed documented beta blocker date and time   Airway Mallampati: III  TM Distance: >3 FB Neck ROM: full    Dental  (+) Teeth Intact, Edentulous Upper   Pulmonary neg pulmonary ROS, former smoker,    Pulmonary exam normal        Cardiovascular Exercise Tolerance: Poor hypertension, On Medications + CAD  Normal cardiovascular exam Rhythm:regular Rate:Normal     Neuro/Psych negative neurological ROS  negative psych ROS   GI/Hepatic Neg liver ROS, PUD, GERD  Medicated,  Endo/Other  negative endocrine ROSdiabetes, Well Controlled, Type 2, Oral Hypoglycemic Agents  Renal/GU negative Renal ROS  negative genitourinary   Musculoskeletal   Abdominal   Peds  Hematology  (+) Blood dyscrasia, anemia ,   Anesthesia Other Findings Past Medical History: No date: Anemia No date: Arthritis No date: Coronary artery disease No date: Diabetes mellitus without complication ([facility]) No date: Diverticulosis No date: Duodenal ulcer No date: Erectile dysfunction No date: GERD (gastroesophageal reflux disease) No date: Hyperlipidemia No date: Hypertension Past Surgical History: No date: CARDIAC CATHETERIZATION 05/31/2017: COLONOSCOPY WITH PROPOFOL; N/A     Comment:  Procedure: COLONOSCOPY WITH PROPOFOL;  Surgeon: Manya Silvas, MD;  Location: [facility] ENDOSCOPY;  Service:               Endoscopy;  Laterality: N/A; No date: JOINT REPLACEMENT; Left     Comment:  knee No date: VASECTOMY   Reproductive/Obstetrics negative OB ROS                             Anesthesia Physical Anesthesia Plan  ASA: III  Anesthesia Plan: General ETT   Post-op Pain Management:    Induction:   PONV Risk Score and  Plan: 3  Airway Management Planned:   Additional Equipment:   Intra-op Plan:   Post-operative Plan:   Informed Consent: I have reviewed the patients History and Physical, chart, labs and discussed the procedure including the risks, benefits and alternatives for the proposed anesthesia with the patient or authorized representative who has indicated his/her understanding and acceptance.     Dental Advisory Given  Plan Discussed with: CRNA  Anesthesia Plan Comments:         Anesthesia Quick Evaluation

## 2019-08-09 NOTE — Transfer of Care (Signed)
Immediate Anesthesia Transfer of Care Note  Patient: Jordan [name]  Procedure(s) Performed: T11-12 POSTERIOR FUSION WITH TRANSPEDICULAR DECOMPRESSION (N/A Back) BONE MARROW ASPIRATION FOR SPINE FUSION ONLY (BMAC) (N/A Back)  Patient Location: PACU  Anesthesia Type:General  Level of Consciousness: drowsy and patient cooperative  Airway & Oxygen Therapy: Patient Spontanous Breathing and Patient connected to face mask oxygen  Post-op Assessment: Report given to RN and Post -op Vital signs reviewed and stable  Post vital signs: Reviewed and stable  Last Vitals:  Vitals Value Taken Time  BP 121/70 08/09/19 1751  Temp 36.3 C 08/09/19 1751  Pulse 64 08/09/19 1759  Resp 15 08/09/19 1759  SpO2 98 % 08/09/19 1759  Vitals shown include unvalidated device data.  Last Pain:  Vitals:   08/09/19 1751  TempSrc:   PainSc: Asleep         Complications: No apparent anesthesia complications

## 2019-08-09 NOTE — Anesthesia Procedure Notes (Signed)
Procedure Name: Intubation Date/Time: 08/09/2019 2:31 PM Performed by: Lia Foyer, CRNA Pre-anesthesia Checklist: Patient identified, Emergency Drugs available, Suction available and Patient being monitored Patient Re-evaluated:Patient Re-evaluated prior to induction Oxygen Delivery Method: Circle system utilized Preoxygenation: Pre-oxygenation with 100% oxygen Induction Type: IV induction Ventilation: Mask ventilation without difficulty Laryngoscope Size: McGraph Grade View: Grade I Tube type: Oral Tube size: 7.5 mm Number of attempts: 1 Airway Equipment and Method: Stylet,  Oral airway and Video-laryngoscopy Placement Confirmation: ETT inserted through vocal cords under direct vision,  positive ETCO2 and breath sounds checked- equal and bilateral Tube secured with: Tape Dental Injury: Teeth and Oropharynx as per pre-operative assessment  Comments: DVL and intubation performed by Simeon Craft under supervision of Floyce Stakes CRNA>

## 2019-08-09 NOTE — Op Note (Signed)
Indications: Mr. Oakman is a 74 yo male who presented with thoracic myelopathy.  He had worsening symptoms including weakness and imbalance, prompting surgical intervention  Findings: T11-12 stenosis with disc herniation  Preoperative Diagnosis: Thoracic myelopathy Postoperative Diagnosis: same   EBL: 200 ml IVF: 900 ml Drains: 1 placed Disposition: Extubated and Stable to PACU Complications: none  No foley catheter was placed.   Preoperative Note:   Risks of surgery discussed include: infection, bleeding, stroke, coma, death, paralysis, CSF leak, nerve/spinal cord injury, numbness, tingling, weakness, complex regional pain syndrome, recurrent stenosis and/or disc herniation, vascular injury, development of instability, neck/back pain, need for further surgery, persistent symptoms, development of deformity, and the risks of anesthesia. The patient understood these risks and agreed to proceed.  Operative Note:  1. Transpedicular discectomy T11/12 2. Posterolateral arthrodesis T11 to T12 3. Posterior nonsegmental instrumentation T11 to T12 using Globus Creo 4. Harvesting of autograft via the same incision 5. Bone marrow aspirate   The patient was brought to the Operating Room, intubated and turned into the prone position. All pressure points were checked and double checked. Flouroscopy was used to mark the incision. The patient was prepped and draped in the standard fashion. A full timeout was performed. Preoperative antibiotics were given. The incision was injected with local anesthetic.  A stab incision was made on the right, then the right iliac crest accessed via a separate incision.  60 ml of aspirate were removed, concentrated, then held for use at autograft.  The incision was opened with a scalpel, then the soft tissues divided with the Bovie. Self-retaining retractors were placed. The paraspinus muscles were reflected laterally in subperiosteal fashion until the transverse  processes were visible. Flouroscopy was used to confirm our localization.   The self-retaining retractors were repositioned. We then placed pedicle screws.   At T11 on one side, a starting point was chosen based on anatomic landmarks, then breached with a high speed drill. A pedicle finder probe was used to cannulate the pedicle, then the balltip probe used to confirm lack of breach. The tract was tapped, re-checked with the balltip probe, then a 6.5 x 45 mm pedicle screw was placed. The procedure was then repeated contralaterally and the same size screw placed.  At T12 on one side, a starting point was chosen based on anatomic landmarks, then breached with a high speed drill. A pedicle finder probe was used to cannulate the pedicle, then the balltip probe used to confirm lack of breach. The tract was tapped, re-checked with the balltip probe, then a 6.5 x 50 mm pedicle screw was placed on the left. The procedure was then repeated contralaterally and the same size screw measured but left out until decompression and pedicle drilling was performed.  At this point, a 65mm rod was placed on the left and secured to [employer] specifications.  We then divided the interspinous ligament at T10/11 and T12/L1.  The spinous processes of T11 and T12 were removed.  The high speed drill was used to drill trough laminectomies removing the inferior 3/4 of T11 lamina and superior 3/4 of the T12 lamina.  The laminae were handed off for use as autograft.   The ligamentum flavum was identified, and noted to be very thick at the T11/12 level.  The drill was used to remove the inferior articulating process of T11 and superior articulating process of T12 on the right.  While protecting the dura and right T11 nerve root, the superior 30% of the R T12  pedicle was drilled to allow safe access to the disc space.  The drill was used to drill into the T12 vertebral body and R side of the T11/12 disc space.  Some of the disc was  removed to allow for safe decompression of the ventral dura.  The disc herniation was palpated, then pushed into the defect and removed until the ventral dura was decompressed.  This was palpated, then confirmed to be decompressed.  The epidural space was then copiously irrigated.  The right T12 screw was then placed. A 45 mm rod was placed on the right and the locking caps secured.  All locking caps were final tightened.    Final AP and lateral radiographs were taken to confirm placement of instrumentation and appropriate alignment. The wound was copiously irrigated, then the external surfaces of the remaining lamina, facet, and transverse processes from T11 to T12 were decorticated. A mixture of allograft and autograft was placed over the decorticated surfaces for arthrodesis.  A drain was placed subfascially.   After hemostasis, the wound was closed in layers with 0 and 2-0 vicryl. A negative pressure dressing was placed on the wound.   The patient was then flipped supine and extubated with incident. All counts were correct times 2 at the end of the case. No immediate complications were noted.  Monitoring was stable throughout.  Marin Olp PA assisted in the entire procedure.  Meade Maw MD

## 2019-08-10 ENCOUNTER — Inpatient Hospital Stay: Payer: Medicare HMO

## 2019-08-10 LAB — HEMOGLOBIN A1C
Hgb A1c MFr Bld: 7.1 % — ABNORMAL HIGH (ref 4.8–5.6)
Mean Plasma Glucose: 157.07 mg/dL

## 2019-08-10 LAB — GLUCOSE, CAPILLARY
Glucose-Capillary: 152 mg/dL — ABNORMAL HIGH (ref 70–99)
Glucose-Capillary: 145 mg/dL — ABNORMAL HIGH (ref 70–99)
Glucose-Capillary: 135 mg/dL — ABNORMAL HIGH (ref 70–99)
Glucose-Capillary: 175 mg/dL — ABNORMAL HIGH (ref 70–99)

## 2019-08-10 MED ORDER — INSULIN ASPART 100 UNIT/ML ~~LOC~~ SOLN
SUBCUTANEOUS | Status: AC
  Administered 2019-08-10: 2 [IU] via SUBCUTANEOUS
  Filled 2019-08-10: qty 1

## 2019-08-10 MED ORDER — SODIUM CHLORIDE FLUSH 0.9 % IV SOLN
INTRAVENOUS | Status: AC
  Administered 2019-08-10: 3 mL via INTRAVENOUS
  Filled 2019-08-10: qty 10

## 2019-08-10 MED ORDER — METHOCARBAMOL 500 MG PO TABS
ORAL_TABLET | ORAL | Status: AC
  Administered 2019-08-10: 1000 mg via ORAL
  Filled 2019-08-10: qty 2

## 2019-08-10 MED ORDER — ACETAMINOPHEN 500 MG PO TABS
ORAL_TABLET | ORAL | Status: AC
  Filled 2019-08-10: qty 2

## 2019-08-10 MED ORDER — KETOROLAC TROMETHAMINE 15 MG/ML IJ SOLN
INTRAMUSCULAR | Status: AC
  Filled 2019-08-10: qty 1

## 2019-08-10 MED ORDER — KETOROLAC TROMETHAMINE 15 MG/ML IJ SOLN
INTRAMUSCULAR | Status: AC
  Administered 2019-08-10: 15 mg via INTRAVENOUS
  Filled 2019-08-10: qty 1

## 2019-08-10 MED ORDER — GABAPENTIN 300 MG PO CAPS
ORAL_CAPSULE | ORAL | Status: AC
  Administered 2019-08-10: 300 mg via ORAL
  Filled 2019-08-10: qty 1

## 2019-08-10 MED ORDER — INSULIN ASPART 100 UNIT/ML ~~LOC~~ SOLN
SUBCUTANEOUS | Status: AC
  Administered 2019-08-10: 3 [IU] via SUBCUTANEOUS
  Filled 2019-08-10: qty 1

## 2019-08-10 MED ORDER — METHOCARBAMOL 500 MG PO TABS
ORAL_TABLET | ORAL | Status: AC
  Filled 2019-08-10: qty 2

## 2019-08-10 NOTE — Evaluation (Signed)
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   Clinical Impression   Pt seen for OT evaluation this date, POD #1 T11-12 discectomy and arthrodesis. Prior to hospital admission, pt was Indep with self care, used SPC occasionally for fxl mobility d/t pain.  Pt lives with spouse in [facility] with 3 STE.  Currently pt demonstrates slight impairments in ADL transfers and standing ADL tolerance as well as ROM limitations secondary to back precautions, requiring MOD/MAX A with LB dressing and peri care at this time and MIN A with UB dressing-to don gown around backside like a jacket. OT educates pt re: task modification and available AE for dressing/bathing/toileting during recovery. Handout issued; Pt very receptive.  Pt would benefit from skilled OT to address noted impairments and functional limitations (see below for any additional details) in order to maximize safety and independence while minimizing falls risk and caregiver burden.  Upon hospital discharge, recommend pt discharge to home with [place] to f/u re: task modification to ensure safety with self care ADLs in setting of back precautions post operatively.    Follow Up Recommendations  Home health OT    Equipment Recommendations  Tub/shower seat    Recommendations for Other Services       Precautions / Restrictions Precautions Precautions: Back;Fall Precaution Booklet Issued: Yes (comment) Required Braces or Orthoses: Other Brace Other Brace: per orders "no brace needed" Restrictions Weight Bearing Restrictions: No      Mobility Bed Mobility Overal bed mobility: Needs Assistance Bed Mobility: Sit to Sidelying     Sit to sidelying: Min assist General bed mobility comments: Pt sitting EOB finishing up with PT when OT presents. Pt requires MIN A to manage LEs for sit  to side lying and MIN verbal cues to sequence log roll tehcnique.  Transfers Overall transfer level: Needs assistance Equipment used: Rolling walker (2 wheeled) Transfers: Sit to/from [facility] Sit to Stand: Min assist Stand pivot transfers: Min assist          Balance Overall balance assessment: Modified Independent(UE support through RW to stand, but no overt LOB. G static standing balance.)                                         ADL either performed or assessed with clinical judgement   ADL Overall ADL's : Needs assistance/impaired Eating/Feeding: Independent;Sitting   Grooming: Wash/dry hands;Independent;Sitting           Upper Body Dressing : Minimal assistance;Sitting   Lower Body Dressing: Moderate assistance;Maximal assistance;Sit to/from stand Lower Body Dressing Details (indicate cue type and reason): educated about use of AE for LBD. Pt able to stand with MIN A for clothing mgt over hips. Toilet Transfer: Minimal assistance;Stand-pivot;BSC   Toileting- Clothing Manipulation and Hygiene: Moderate assistance;Sitting/lateral lean Toileting - Clothing Manipulation Details (indicate cue type and reason): MIN A for clothing mgt, but MOD A for peri care d/t no twisting. Educated about modified techniques for performing peri care at home such as use of plastic/rubber kitchen tongs to hold TP to reduce need to twist torso.     Functional mobility during ADLs: Minimal assistance;Rolling walker(to take 4-5 shuffling side steps from [facility] to EOB on his left)       Vision Patient  Visual Report: No change from baseline       Perception     Praxis      Pertinent Vitals/Pain Pain Assessment: 0-10 Pain Score: 2  Pain Location: surgical site Pain Descriptors / Indicators: Discomfort;Sore Pain Intervention(s): Monitored during session;Repositioned     Hand Dominance     Extremity/Trunk Assessment Upper Extremity Assessment Upper  Extremity Assessment: Overall WFL for tasks assessed   Lower Extremity Assessment Lower Extremity Assessment: Defer to PT evaluation;Overall WFL for tasks assessed       Communication Communication Communication: No difficulties   Cognition Arousal/Alertness: Awake/alert Behavior During Therapy: WFL for tasks assessed/performed Overall Cognitive Status: Within Functional Limits for tasks assessed                                     General Comments       Exercises Exercises: Other exercises Other Exercises Other Exercises: OT facilitates education with pt re: modified LB ADL tehcniques including AE that is available for dressing, bathing, and toileting to reduce risk for breaking back precautions over course of recovery. Pt very receptive. Handout issued.   Shoulder Instructions      Home Living Family/patient expects to be discharged to:: Private residence Living Arrangements: Spouse/significant other Available Help at Discharge: Family Type of Home: House Home Access: Stairs to enter Technical brewer of Steps: 3 Entrance Stairs-Rails: Can reach both [place]: One level           Bathroom Accessibility: Yes   Home Equipment: [place] - 2 wheels;Bedside commode;Cane - single point          Prior Functioning/Environment Level of Independence: Independent        Comments: Pt started using SPC a few weeks ago because of R LE weakness/numbness        OT Problem List: Decreased strength;Decreased activity tolerance;Impaired balance (sitting and/or standing);Decreased knowledge of use of DME or AE;Decreased knowledge of precautions      OT Treatment/Interventions: Self-care/ADL training;DME and/or AE instruction;Therapeutic activities;Balance training;Patient/family education    OT Goals(Current goals can be found in the care plan section) Acute Rehab OT Goals Patient Stated Goal: Get back home and recover from surgery OT Goal  Formulation: With patient Time For Goal Achievement: 08/24/19 Potential to Achieve Goals: Good  OT Frequency:     Barriers to D/C:            Co-evaluation              AM-PAC OT "6 Clicks" Daily Activity     Outcome Measure Help from another person eating meals?: None Help from another person taking care of personal grooming?: None Help from another person toileting, which includes using toliet, bedpan, or urinal?: A Little Help from another person bathing (including washing, rinsing, drying)?: A Little Help from another person to put on and taking off regular upper body clothing?: A Little Help from another person to put on and taking off regular lower body clothing?: A Little 6 Click Score: 20   End of Session Equipment Utilized During Treatment: Gait belt;Rolling walker Nurse Communication: Mobility status  Activity Tolerance: Patient tolerated treatment well Patient left: in bed;with call bell/phone within reach  OT Visit Diagnosis: Unsteadiness on feet (R26.81);Other abnormalities of gait and mobility (R26.89)                Time: [id number] OT Time Calculation (min): 23 min Charges:  OT General Charges $OT Visit: 1 Visit OT Evaluation $OT Eval Low Complexity: 1 Low OT Treatments $Self Care/Home Management : 8-22 mins  Gerrianne Scale, MS, OTR/L ascom [phone] 08/10/19, 2:31 PM

## 2019-08-10 NOTE — TOC Initial Note (Signed)
Transition of Care [facility]) - Initial/Assessment Note    Patient Details  Name: Jordan [name] MRN: [id number] Date of Birth: 06/09/1946  Transition of Care [facility]) CM/SW Contact:    Anselm Pancoast, RN Phone Number: 08/10/2019, 12:06 PM  Clinical Narrative:                 Lives at home with his wife who assists in his care as needed. Patient had knee surgery in 2013 and utilized home health. Patient thinks it was [facility] and wants to start with checking availability there first. Has 3 steps to enter home with bilateral guardrails.   Expected Discharge Plan: [place]     Patient Goals and CMS Choice Patient states their goals for this hospitalization and ongoing recovery are:: Return home and start feeling better      Expected Discharge Plan and Services Expected Discharge Plan: [place]       Living arrangements for the past 2 months: Single Family Home                                      Prior Living Arrangements/Services Living arrangements for the past 2 months: Single Family Home Lives with:: Spouse Patient language and need for interpreter reviewed:: Yes Do you feel safe going back to the place where you live?: Yes      Need for Family Participation in Patient Care: Yes (Comment) Care giver support system in place?: Yes (comment) Current home services: DME(walker, grabber, bedside commode) Criminal Activity/Legal Involvement Pertinent to Current Situation/Hospitalization: No - Comment as needed  Activities of Daily Living Home Assistive Devices/Equipment: Cane (specify quad or straight) ADL Screening (condition at time of admission) Patient's cognitive ability adequate to safely complete daily activities?: Yes Is the patient deaf or have difficulty hearing?: No Does the patient have difficulty seeing, even when wearing glasses/contacts?: No Does the patient have difficulty concentrating, remembering, or making decisions?:  No Patient able to express need for assistance with ADLs?: Yes Does the patient have difficulty dressing or bathing?: No Independently performs ADLs?: Yes (appropriate for developmental age) Does the patient have difficulty walking or climbing stairs?: Yes Weakness of Legs: Right Weakness of Arms/Hands: None  Permission Sought/Granted Permission sought to share information with : Facility Art therapist granted to share information with : Yes, Verbal Permission Granted  Share Information with NAME: Home health agencies           Emotional Assessment Appearance:: Appears stated age Attitude/Demeanor/Rapport: Engaged, Ambitious Affect (typically observed): Accepting Orientation: : Oriented to Self, Oriented to Place, Oriented to  Time, Oriented to Situation Alcohol / Substance Use: Never Used Psych Involvement: No (comment)  Admission diagnosis:  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 Thoracic myelopathy 08/09/2019   PCP:  Sofie Hartigan, MD Pharmacy:   [facility] [address] [place] [address] Phone: [phone] Fax: [phone]     Social Determinants of Health (SDOH) Interventions    Readmission Risk Interventions No flowsheet data found.

## 2019-08-10 NOTE — TOC Progression Note (Signed)
Transition of Care [facility]) - Progression Note    Patient Details  Name: Jordan [name] MRN: [id number] Date of Birth: 02-21-1946  Transition of Care [facility]) CM/SW [place], RN Phone Number: 08/10/2019, 12:36 PM  Clinical Narrative:     Patient accepted by [facility] and information faxed to [place] [facility]. Anticipate discharge home tomorrow. No further needs. TOC signing off.    Expected Discharge Plan: [place]    Expected Discharge Plan and Services Expected Discharge Plan: [place]       Living arrangements for the past 2 months: Single Family Home                                       Social Determinants of Health (SDOH) Interventions    Readmission Risk Interventions No flowsheet data found.

## 2019-08-10 NOTE — Evaluation (Signed)
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  Clinical Impression  Pt did well with PT exam showing good effort and awareness of precautions with education and initial reinforcement/cuing.  He was able to ambulate ~100 ft with RW showing slow but consistent and confident cadence.  Brief trial of less UE use and he showed limp on R and we quickly agreed that he should continue to use walker and work with PT at home for when to transition to cane and beyond.  Pt with very good strength t/o, though still having some numbness in R thigh and some minimal R hip weakness that did limit functional tasks.  Discussed management in the home and answered all questions.  Pt feeling confident about being able to manage at home with assist from wife.    Follow Up Recommendations Follow surgeon's recommendation for DC plan and follow-up therapies;Supervision - Intermittent;Home health PT    Equipment Recommendations  None recommended by PT    Recommendations for Other Services       Precautions / Restrictions Precautions Precautions: Back;Fall Precaution Booklet Issued: Yes (comment) Required Braces or Orthoses: (per orders, brace not needed) Restrictions Weight Bearing Restrictions: No      Mobility  Bed Mobility Overal bed mobility: Needs Assistance Bed Mobility: Sidelying to Sit;Rolling Rolling: Min guard Sidelying to sit: Min guard       General bed mobility comments: Pt educated on appropriate posture/alignment with log-roll technique.  Needed consistent cuing able to get to sitting maintaining neutral spine and w/o direct assist  Transfers Overall transfer level: Modified independent Equipment used: Rolling walker (2 wheeled)             General transfer comment: 2 sit to stand efforts and pt did not need physical  assist to rise.  Constant cuing reminders to avoid forward bend and insure symmetrical push.  Pt with good LE strength/control with rise and descent  Ambulation/Gait Ambulation/Gait assistance: Modified independent (Device/Increase time) Gait Distance (Feet): 100 Feet Assistive device: Rolling walker (2 wheeled)       General Gait Details: Pt did well with ambulation using RW with slow but steady and safe cadence and no overt R limp (with use of UEs on walker), trail of very light UE use and pt was much more hesitant and showed no buckling but decreased stance time and confidence on R, unable to trial/tolerate single UE trail (cane simulation).    Stairs         General stair comments: educated pt on appropriate stair negotiation, answered questions  Wheelchair Mobility    Modified Rankin (Stroke Patients Only)       Balance Overall balance assessment: Modified Independent(needed UE support but no LOBs or overt safety issues with AD)                                           Pertinent Vitals/Pain Pain Assessment: 0-10 Pain Score: 2  Pain Location: surgical site    Home Living Family/patient expects to be discharged to:: Private residence Living Arrangements: Spouse/significant other Available Help at Discharge: Family Type of Home: House Home Access: Stairs to enter Entrance Stairs-Rails: Can reach both Entrance Stairs-Number of Steps: [place]: Environmental consultant - 2 wheels;Bedside commode;Cane -  single point      Prior Function Level of Independence: Independent         Comments: Pt started using SPC a few weeks ago because of R LE weakness/numbness     Hand Dominance        Extremity/Trunk Assessment   Upper Extremity Assessment Upper Extremity Assessment: Overall WFL for tasks assessed    Lower Extremity Assessment Lower Extremity Assessment: Overall WFL for tasks assessed(R hip grossly 4/5, mild pain with R SLR )        Communication   Communication: No difficulties  Cognition Arousal/Alertness: Awake/alert Behavior During Therapy: WFL for tasks assessed/performed Overall Cognitive Status: Within Functional Limits for tasks assessed                                        General Comments      Exercises Other Exercises Other Exercises: Introduced and educated on HEP, precautions and trouble shooting managing in the home.  All questions answered.   Assessment/Plan    PT Assessment Patient needs continued PT services  PT Problem List Decreased strength;Decreased activity tolerance;Decreased mobility;Decreased safety awareness;Decreased knowledge of use of DME;Decreased knowledge of precautions;Pain;Impaired sensation       PT Treatment Interventions Gait training;Stair training;Functional mobility training;Therapeutic activities;Therapeutic exercise;Balance training;Cognitive remediation;Patient/family education    PT Goals (Current goals can be found in the Care Plan section)  Acute Rehab PT Goals Patient Stated Goal: Get back home and recover from surgery PT Goal Formulation: With patient Time For Goal Achievement: 08/24/19 Potential to Achieve Goals: Good    Frequency 7X/week   Barriers to discharge        Co-evaluation               AM-PAC PT "6 Clicks" Mobility  Outcome Measure Help needed turning from your back to your side while in a flat bed without using bedrails?: A Little Help needed moving from lying on your back to sitting on the side of a flat bed without using bedrails?: A Little Help needed moving to and from a bed to a chair (including a wheelchair)?: A Little Help needed standing up from a chair using your arms (e.g., wheelchair or bedside chair)?: A Little Help needed to walk in hospital room?: A Little Help needed climbing 3-5 steps with a railing? : A Little 6 Click Score: 18    End of Session Equipment Utilized During Treatment: Gait  belt Activity Tolerance: Patient tolerated treatment well Patient left: in bed;with call bell/phone within reach;with nursing/sitter in room(getting ready to try and use BSC) Nurse Communication: Mobility status PT Visit Diagnosis: Muscle weakness (generalized) (M62.81);Difficulty in walking, not elsewhere classified (R26.2);Pain Pain - Right/Left: Right Pain - part of body: Hip(and lumbago)    Time: 1030-1058 PT Time Calculation (min) (ACUTE ONLY): 28 min   Charges:   PT Evaluation $PT Eval Low Complexity: 1 Low PT Treatments $Therapeutic Activity: 8-22 mins        Kreg Shropshire, DPT 08/10/2019, 12:46 PM

## 2019-08-10 NOTE — Progress Notes (Signed)
Procedure: T11-12 transpedicular discectomy and fusion Procedure date: 08/09/2019 Diagnosis: Thoracic myelopathy   History: Jordan [name] is s/p T11-12 transpedicular discectomy and fusion for thoracic myelopathy POD1: He is recovering well.  Pain currently rated 1/10.  He was able to stand during x-rays which caused an increase in pain but was tolerable.  He has not ambulated (still in preop room).  Voiding without issue.  Drain output 60 in the last 7 hours.  Continues to experience persistent numbness in the right anterior thigh but feels that this is improving.  Physical Exam: Vitals:   08/10/19 0400 08/10/19 0800  BP: (!) 161/81 (!) 160/80  Pulse: 96 77  Resp: 18 16  Temp: 99 F (37.2 C) 98 F (36.7 C)  SpO2: 96% 98%    General: Alert and oriented, sitting upright in hospital bed  strength:5/5 throughout lower extremities bilaterally Sensation: Decreased sensation right anterior thigh otherwise intact and symmetric  Data:  Recent Labs  Lab 08/07/19 1152  NA 139  K 3.8  CL 101  CO2 27  BUN 14  CREATININE 1.04  GLUCOSE 216*  CALCIUM 9.5   No results for input(s): AST, ALT, ALKPHOS in the last 168 hours.  Invalid input(s): TBILI   Recent Labs  Lab 08/07/19 1152  WBC 7.8  HGB 15.5  HCT 44.6  PLT 230   Recent Labs  Lab 08/07/19 1152  APTT 25  INR 0.9         Other tests/results:  EXAM: THORACIC SPINE 2 VIEWS 08/10/2019  COMPARISON:  08/09/2019, 08/01/2019  FINDINGS: Interval surgical changes of posterior fusion of T11-T12 with bilateral pedicle screw and rod fixation. Surgical drain projects within the soft tissues.  No complicating features.  Degenerative changes again demonstrated.  IMPRESSION: Interval surgical changes of posterior fusion of T11-T12, with bilateral pedicle screw and rod fixation, and no complicating features.  Assessment/Plan:  Jordan [name] is POD0 s/p T11-12 spinal fusion for thoracic myelopathy. Will continue to  monitor  - monitor drain output -60 over the last 7 hours - mobilize - pain control - DVT prophylaxis - PTOT - Brace - Imaging -completed   Marin Olp PA-C Department of Neurosurgery

## 2019-08-11 LAB — GLUCOSE, CAPILLARY: Glucose-Capillary: 148 mg/dL — ABNORMAL HIGH (ref 70–99)

## 2019-08-11 MED ORDER — METHOCARBAMOL 500 MG PO TABS: 500.0000 mg | ORAL_TABLET | Freq: Four times a day (QID) | ORAL | 0 refills | Status: DC | PRN

## 2019-08-11 MED ORDER — KETOROLAC TROMETHAMINE 15 MG/ML IJ SOLN
INTRAMUSCULAR | Status: AC
  Administered 2019-08-11: 15 mg via INTRAVENOUS
  Filled 2019-08-11: qty 1

## 2019-08-11 MED ORDER — INSULIN ASPART 100 UNIT/ML ~~LOC~~ SOLN
SUBCUTANEOUS | Status: AC
  Filled 2019-08-11: qty 1

## 2019-08-11 MED ORDER — METHOCARBAMOL 500 MG PO TABS
ORAL_TABLET | ORAL | Status: AC
  Administered 2019-08-11: 1000 mg via ORAL
  Filled 2019-08-11: qty 2

## 2019-08-11 MED ORDER — OXYCODONE HCL 5 MG PO TABS: 5.0000 mg | ORAL_TABLET | ORAL | 0 refills | Status: AC | PRN

## 2019-08-11 NOTE — Anesthesia Postprocedure Evaluation (Signed)
Anesthesia Post Note  Patient: Jaisean Kusnierz  Procedure(s) Performed: T11-12 POSTERIOR FUSION WITH TRANSPEDICULAR DECOMPRESSION (N/A Back) BONE MARROW ASPIRATION FOR SPINE FUSION ONLY (BMAC) (N/A Back)  Patient location during evaluation: PACU Anesthesia Type: General Level of consciousness: awake and alert and oriented Pain management: pain level controlled Vital Signs Assessment: post-procedure vital signs reviewed and stable Respiratory status: spontaneous breathing Cardiovascular status: blood pressure returned to baseline Anesthetic complications: no     Last Vitals:  Vitals:   08/11/19 0615 08/11/19 1112  BP: (!) 158/81 (!) 149/83  Pulse: 68 78  Resp: 18 18  Temp: 37.4 C 36.8 C  SpO2: 96% 96%    Last Pain:  Vitals:   08/11/19 1112  TempSrc: Tympanic  PainSc: 0-No pain                 Mervin Ramires

## 2019-08-11 NOTE — Discharge Summary (Signed)
Procedure: T11-12 transpedicular discectomy and fusion Procedure date: 08/09/2019 Diagnosis: Thoracic myelopathy   History: Jordan [name] is s/p T11-12 transpedicular discectomy and fusion for thoracic myelopathy  POD 2: Continues to do well.  Pain currently 0/10.  Drain output 40 overnight.  He has been working with physical therapy and still requires walker for assistance but he feels there has been improvement.  Continues to experience persistent numbness in the right anterior thigh but overall, lower extremity complaints have improved.  Voiding without issue.  No BM at this time.  POD1: He is recovering well.  Pain currently rated 1/10.  He was able to stand during x-rays which caused an increase in pain but was tolerable.  He has not ambulated (still in preop room).  Voiding without issue.  Drain output 60 in the last 7 hours.  Continues to experience persistent numbness in the right anterior thigh but feels that this is improving.  Physical Exam: Vitals:   08/11/19 0500 08/11/19 0615  BP: (!) 158/81 (!) 158/81  Pulse: 68 68  Resp: 18 18  Temp:  99.3 F (37.4 C)  SpO2: 96% 96%    General: Alert and oriented, sitting upright in hospital bed  strength:5/5 throughout lower extremities bilaterally except 4/5 right EHL Sensation: Decreased sensation right anterior thigh otherwise intact and symmetric  Data:  Recent Labs  Lab 08/07/19 1152  NA 139  K 3.8  CL 101  CO2 27  BUN 14  CREATININE 1.04  GLUCOSE 216*  CALCIUM 9.5   No results for input(s): AST, ALT, ALKPHOS in the last 168 hours.  Invalid input(s): TBILI   Recent Labs  Lab 08/07/19 1152  WBC 7.8  HGB 15.5  HCT 44.6  PLT 230   Recent Labs  Lab 08/07/19 1152  APTT 25  INR 0.9         Other tests/results:  EXAM: THORACIC SPINE 2 VIEWS 08/10/2019  COMPARISON:  08/09/2019, 08/01/2019  FINDINGS: Interval surgical changes of posterior fusion of T11-T12 with bilateral pedicle screw and rod fixation.  Surgical drain projects within the soft tissues.  No complicating features.  Degenerative changes again demonstrated.  IMPRESSION: Interval surgical changes of posterior fusion of T11-T12, with bilateral pedicle screw and rod fixation, and no complicating features.  Assessment/Plan:  Jordan [name] is POD2 s/p T11-12 spinal fusion for thoracic myelopathy.  Symptoms that were present prior to surgery are improving.  We will continue postop pain control with Tylenol, muscle relaxer, and pain medication as needed.  Home PT, OT and nursing assistance ordered.  He is scheduled to follow-up in approximately 2 weeks to monitor progress.  Advised to contact office if any questions or concerns arise before then.   Marin Olp PA-C Department of Neurosurgery

## 2019-08-12 DIAGNOSIS — M5104 Intervertebral disc disorders with myelopathy, thoracic region: Secondary | ICD-10-CM | POA: Diagnosis not present

## 2019-08-12 DIAGNOSIS — M4804 Spinal stenosis, thoracic region: Secondary | ICD-10-CM | POA: Diagnosis not present

## 2019-08-12 DIAGNOSIS — Z981 Arthrodesis status: Secondary | ICD-10-CM | POA: Diagnosis not present

## 2019-08-12 DIAGNOSIS — Z9181 History of falling: Secondary | ICD-10-CM | POA: Diagnosis not present

## 2019-08-12 DIAGNOSIS — Z4789 Encounter for other orthopedic aftercare: Secondary | ICD-10-CM | POA: Diagnosis not present

## 2019-08-14 DIAGNOSIS — M5104 Intervertebral disc disorders with myelopathy, thoracic region: Secondary | ICD-10-CM | POA: Diagnosis not present

## 2019-08-14 DIAGNOSIS — Z981 Arthrodesis status: Secondary | ICD-10-CM | POA: Diagnosis not present

## 2019-08-14 DIAGNOSIS — M4804 Spinal stenosis, thoracic region: Secondary | ICD-10-CM | POA: Diagnosis not present

## 2019-08-14 DIAGNOSIS — Z4789 Encounter for other orthopedic aftercare: Secondary | ICD-10-CM | POA: Diagnosis not present

## 2019-08-14 DIAGNOSIS — Z9181 History of falling: Secondary | ICD-10-CM | POA: Diagnosis not present

## 2019-08-15 DIAGNOSIS — M4804 Spinal stenosis, thoracic region: Secondary | ICD-10-CM | POA: Diagnosis not present

## 2019-08-15 DIAGNOSIS — M5104 Intervertebral disc disorders with myelopathy, thoracic region: Secondary | ICD-10-CM | POA: Diagnosis not present

## 2019-08-15 DIAGNOSIS — Z4789 Encounter for other orthopedic aftercare: Secondary | ICD-10-CM | POA: Diagnosis not present

## 2019-08-15 DIAGNOSIS — Z9181 History of falling: Secondary | ICD-10-CM | POA: Diagnosis not present

## 2019-08-15 DIAGNOSIS — Z981 Arthrodesis status: Secondary | ICD-10-CM | POA: Diagnosis not present

## 2019-08-17 DIAGNOSIS — M4804 Spinal stenosis, thoracic region: Secondary | ICD-10-CM | POA: Diagnosis not present

## 2019-08-17 DIAGNOSIS — M5104 Intervertebral disc disorders with myelopathy, thoracic region: Secondary | ICD-10-CM | POA: Diagnosis not present

## 2019-08-17 DIAGNOSIS — Z981 Arthrodesis status: Secondary | ICD-10-CM | POA: Diagnosis not present

## 2019-08-17 DIAGNOSIS — Z9181 History of falling: Secondary | ICD-10-CM | POA: Diagnosis not present

## 2019-08-17 DIAGNOSIS — Z4789 Encounter for other orthopedic aftercare: Secondary | ICD-10-CM | POA: Diagnosis not present

## 2019-08-21 DIAGNOSIS — M4804 Spinal stenosis, thoracic region: Secondary | ICD-10-CM | POA: Diagnosis not present

## 2019-08-21 DIAGNOSIS — Z981 Arthrodesis status: Secondary | ICD-10-CM | POA: Diagnosis not present

## 2019-08-21 DIAGNOSIS — Z4789 Encounter for other orthopedic aftercare: Secondary | ICD-10-CM | POA: Diagnosis not present

## 2019-08-21 DIAGNOSIS — M5104 Intervertebral disc disorders with myelopathy, thoracic region: Secondary | ICD-10-CM | POA: Diagnosis not present

## 2019-08-21 DIAGNOSIS — Z9181 History of falling: Secondary | ICD-10-CM | POA: Diagnosis not present

## 2019-08-22 DIAGNOSIS — Z9181 History of falling: Secondary | ICD-10-CM | POA: Diagnosis not present

## 2019-08-22 DIAGNOSIS — M5104 Intervertebral disc disorders with myelopathy, thoracic region: Secondary | ICD-10-CM | POA: Diagnosis not present

## 2019-08-22 DIAGNOSIS — M4804 Spinal stenosis, thoracic region: Secondary | ICD-10-CM | POA: Diagnosis not present

## 2019-08-22 DIAGNOSIS — Z981 Arthrodesis status: Secondary | ICD-10-CM | POA: Diagnosis not present

## 2019-08-22 DIAGNOSIS — Z4789 Encounter for other orthopedic aftercare: Secondary | ICD-10-CM | POA: Diagnosis not present

## 2019-08-24 DIAGNOSIS — Z4789 Encounter for other orthopedic aftercare: Secondary | ICD-10-CM | POA: Diagnosis not present

## 2019-08-24 DIAGNOSIS — Z9181 History of falling: Secondary | ICD-10-CM | POA: Diagnosis not present

## 2019-08-24 DIAGNOSIS — Z981 Arthrodesis status: Secondary | ICD-10-CM | POA: Diagnosis not present

## 2019-08-24 DIAGNOSIS — M5104 Intervertebral disc disorders with myelopathy, thoracic region: Secondary | ICD-10-CM | POA: Diagnosis not present

## 2019-08-24 DIAGNOSIS — M4804 Spinal stenosis, thoracic region: Secondary | ICD-10-CM | POA: Diagnosis not present

## 2019-08-30 DIAGNOSIS — Z9181 History of falling: Secondary | ICD-10-CM | POA: Diagnosis not present

## 2019-08-30 DIAGNOSIS — Z981 Arthrodesis status: Secondary | ICD-10-CM | POA: Diagnosis not present

## 2019-08-30 DIAGNOSIS — Z4789 Encounter for other orthopedic aftercare: Secondary | ICD-10-CM | POA: Diagnosis not present

## 2019-08-30 DIAGNOSIS — M4804 Spinal stenosis, thoracic region: Secondary | ICD-10-CM | POA: Diagnosis not present

## 2019-08-30 DIAGNOSIS — M5104 Intervertebral disc disorders with myelopathy, thoracic region: Secondary | ICD-10-CM | POA: Diagnosis not present

## 2019-09-07 DIAGNOSIS — M5104 Intervertebral disc disorders with myelopathy, thoracic region: Secondary | ICD-10-CM | POA: Diagnosis not present

## 2019-09-07 DIAGNOSIS — Z4789 Encounter for other orthopedic aftercare: Secondary | ICD-10-CM | POA: Diagnosis not present

## 2019-09-07 DIAGNOSIS — M4804 Spinal stenosis, thoracic region: Secondary | ICD-10-CM | POA: Diagnosis not present

## 2019-09-07 DIAGNOSIS — Z981 Arthrodesis status: Secondary | ICD-10-CM | POA: Diagnosis not present

## 2019-09-07 DIAGNOSIS — Z9181 History of falling: Secondary | ICD-10-CM | POA: Diagnosis not present

## 2019-09-11 DIAGNOSIS — M4804 Spinal stenosis, thoracic region: Secondary | ICD-10-CM | POA: Diagnosis not present

## 2019-09-11 DIAGNOSIS — Z9181 History of falling: Secondary | ICD-10-CM | POA: Diagnosis not present

## 2019-09-11 DIAGNOSIS — Z4789 Encounter for other orthopedic aftercare: Secondary | ICD-10-CM | POA: Diagnosis not present

## 2019-09-11 DIAGNOSIS — Z981 Arthrodesis status: Secondary | ICD-10-CM | POA: Diagnosis not present

## 2019-09-11 DIAGNOSIS — M5104 Intervertebral disc disorders with myelopathy, thoracic region: Secondary | ICD-10-CM | POA: Diagnosis not present

## 2019-09-12 DIAGNOSIS — Z4789 Encounter for other orthopedic aftercare: Secondary | ICD-10-CM | POA: Diagnosis not present

## 2019-09-12 DIAGNOSIS — M5104 Intervertebral disc disorders with myelopathy, thoracic region: Secondary | ICD-10-CM | POA: Diagnosis not present

## 2019-09-12 DIAGNOSIS — M4804 Spinal stenosis, thoracic region: Secondary | ICD-10-CM | POA: Diagnosis not present

## 2019-09-12 DIAGNOSIS — Z981 Arthrodesis status: Secondary | ICD-10-CM | POA: Diagnosis not present

## 2019-09-12 DIAGNOSIS — Z9181 History of falling: Secondary | ICD-10-CM | POA: Diagnosis not present

## 2019-09-14 DIAGNOSIS — M4324 Fusion of spine, thoracic region: Secondary | ICD-10-CM | POA: Diagnosis not present

## 2019-09-19 DIAGNOSIS — E785 Hyperlipidemia, unspecified: Secondary | ICD-10-CM | POA: Diagnosis not present

## 2019-09-19 DIAGNOSIS — Z9889 Other specified postprocedural states: Secondary | ICD-10-CM | POA: Diagnosis not present

## 2019-09-19 DIAGNOSIS — E1159 Type 2 diabetes mellitus with other circulatory complications: Secondary | ICD-10-CM | POA: Diagnosis not present

## 2019-09-20 DIAGNOSIS — M5104 Intervertebral disc disorders with myelopathy, thoracic region: Secondary | ICD-10-CM | POA: Diagnosis not present

## 2019-09-20 DIAGNOSIS — Z4789 Encounter for other orthopedic aftercare: Secondary | ICD-10-CM | POA: Diagnosis not present

## 2019-09-20 DIAGNOSIS — Z981 Arthrodesis status: Secondary | ICD-10-CM | POA: Diagnosis not present

## 2019-09-20 DIAGNOSIS — Z9181 History of falling: Secondary | ICD-10-CM | POA: Diagnosis not present

## 2019-09-20 DIAGNOSIS — M4804 Spinal stenosis, thoracic region: Secondary | ICD-10-CM | POA: Diagnosis not present

## 2019-10-13 DIAGNOSIS — E7800 Pure hypercholesterolemia, unspecified: Secondary | ICD-10-CM | POA: Diagnosis not present

## 2019-10-20 DIAGNOSIS — E1169 Type 2 diabetes mellitus with other specified complication: Secondary | ICD-10-CM | POA: Diagnosis not present

## 2019-10-20 DIAGNOSIS — G4733 Obstructive sleep apnea (adult) (pediatric): Secondary | ICD-10-CM | POA: Diagnosis not present

## 2019-10-20 DIAGNOSIS — E669 Obesity, unspecified: Secondary | ICD-10-CM | POA: Diagnosis not present

## 2019-10-20 DIAGNOSIS — E1142 Type 2 diabetes mellitus with diabetic polyneuropathy: Secondary | ICD-10-CM | POA: Diagnosis not present

## 2019-10-20 DIAGNOSIS — R809 Proteinuria, unspecified: Secondary | ICD-10-CM | POA: Diagnosis not present

## 2019-10-20 DIAGNOSIS — E1129 Type 2 diabetes mellitus with other diabetic kidney complication: Secondary | ICD-10-CM | POA: Diagnosis not present

## 2019-10-20 DIAGNOSIS — Z87891 Personal history of nicotine dependence: Secondary | ICD-10-CM | POA: Diagnosis not present

## 2019-10-20 DIAGNOSIS — E785 Hyperlipidemia, unspecified: Secondary | ICD-10-CM | POA: Diagnosis not present

## 2019-10-20 DIAGNOSIS — I10 Essential (primary) hypertension: Secondary | ICD-10-CM | POA: Diagnosis not present

## 2019-10-20 DIAGNOSIS — E7800 Pure hypercholesterolemia, unspecified: Secondary | ICD-10-CM | POA: Diagnosis not present

## 2019-10-20 DIAGNOSIS — E1159 Type 2 diabetes mellitus with other circulatory complications: Secondary | ICD-10-CM | POA: Diagnosis not present

## 2019-10-20 DIAGNOSIS — Z0000 Encounter for general adult medical examination without abnormal findings: Secondary | ICD-10-CM | POA: Diagnosis not present

## 2019-10-20 DIAGNOSIS — Z6837 Body mass index (BMI) 37.0-37.9, adult: Secondary | ICD-10-CM | POA: Diagnosis not present

## 2019-10-20 DIAGNOSIS — K219 Gastro-esophageal reflux disease without esophagitis: Secondary | ICD-10-CM | POA: Diagnosis not present

## 2019-10-26 DIAGNOSIS — M4324 Fusion of spine, thoracic region: Secondary | ICD-10-CM | POA: Diagnosis not present

## 2020-02-01 DIAGNOSIS — M47816 Spondylosis without myelopathy or radiculopathy, lumbar region: Secondary | ICD-10-CM | POA: Diagnosis not present

## 2020-02-01 DIAGNOSIS — M4714 Other spondylosis with myelopathy, thoracic region: Secondary | ICD-10-CM | POA: Diagnosis not present

## 2020-02-01 DIAGNOSIS — Z981 Arthrodesis status: Secondary | ICD-10-CM | POA: Diagnosis not present

## 2020-02-01 DIAGNOSIS — M4324 Fusion of spine, thoracic region: Secondary | ICD-10-CM | POA: Diagnosis not present

## 2020-02-01 DIAGNOSIS — M419 Scoliosis, unspecified: Secondary | ICD-10-CM | POA: Diagnosis not present

## 2020-02-01 DIAGNOSIS — R2689 Other abnormalities of gait and mobility: Secondary | ICD-10-CM | POA: Diagnosis not present

## 2020-02-06 DIAGNOSIS — R809 Proteinuria, unspecified: Secondary | ICD-10-CM | POA: Diagnosis not present

## 2020-02-06 DIAGNOSIS — E119 Type 2 diabetes mellitus without complications: Secondary | ICD-10-CM | POA: Diagnosis not present

## 2020-03-12 DIAGNOSIS — I152 Hypertension secondary to endocrine disorders: Secondary | ICD-10-CM | POA: Diagnosis not present

## 2020-03-12 DIAGNOSIS — Z9889 Other specified postprocedural states: Secondary | ICD-10-CM | POA: Diagnosis not present

## 2020-03-12 DIAGNOSIS — E1159 Type 2 diabetes mellitus with other circulatory complications: Secondary | ICD-10-CM | POA: Diagnosis not present

## 2020-04-18 DIAGNOSIS — E7800 Pure hypercholesterolemia, unspecified: Secondary | ICD-10-CM | POA: Diagnosis not present

## 2020-04-25 DIAGNOSIS — E1142 Type 2 diabetes mellitus with diabetic polyneuropathy: Secondary | ICD-10-CM | POA: Diagnosis not present

## 2020-04-25 DIAGNOSIS — E7800 Pure hypercholesterolemia, unspecified: Secondary | ICD-10-CM | POA: Diagnosis not present

## 2020-04-25 DIAGNOSIS — G4733 Obstructive sleep apnea (adult) (pediatric): Secondary | ICD-10-CM | POA: Diagnosis not present

## 2020-04-25 DIAGNOSIS — K219 Gastro-esophageal reflux disease without esophagitis: Secondary | ICD-10-CM | POA: Diagnosis not present

## 2020-04-25 DIAGNOSIS — Z6836 Body mass index (BMI) 36.0-36.9, adult: Secondary | ICD-10-CM | POA: Diagnosis not present

## 2020-04-25 DIAGNOSIS — I10 Essential (primary) hypertension: Secondary | ICD-10-CM | POA: Diagnosis not present

## 2020-04-26 DIAGNOSIS — E669 Obesity, unspecified: Secondary | ICD-10-CM | POA: Diagnosis not present

## 2020-04-26 DIAGNOSIS — E1169 Type 2 diabetes mellitus with other specified complication: Secondary | ICD-10-CM | POA: Diagnosis not present

## 2020-04-26 DIAGNOSIS — E1129 Type 2 diabetes mellitus with other diabetic kidney complication: Secondary | ICD-10-CM | POA: Diagnosis not present

## 2020-04-26 DIAGNOSIS — R809 Proteinuria, unspecified: Secondary | ICD-10-CM | POA: Diagnosis not present

## 2020-04-26 DIAGNOSIS — E1159 Type 2 diabetes mellitus with other circulatory complications: Secondary | ICD-10-CM | POA: Diagnosis not present

## 2020-04-26 DIAGNOSIS — E785 Hyperlipidemia, unspecified: Secondary | ICD-10-CM | POA: Diagnosis not present

## 2020-04-26 DIAGNOSIS — I152 Hypertension secondary to endocrine disorders: Secondary | ICD-10-CM | POA: Diagnosis not present

## 2020-05-09 DIAGNOSIS — Z981 Arthrodesis status: Secondary | ICD-10-CM | POA: Diagnosis not present

## 2020-05-09 DIAGNOSIS — M4324 Fusion of spine, thoracic region: Secondary | ICD-10-CM | POA: Diagnosis not present

## 2020-05-09 DIAGNOSIS — M419 Scoliosis, unspecified: Secondary | ICD-10-CM | POA: Diagnosis not present

## 2020-05-09 DIAGNOSIS — M47816 Spondylosis without myelopathy or radiculopathy, lumbar region: Secondary | ICD-10-CM | POA: Diagnosis not present

## 2020-09-24 DIAGNOSIS — E1142 Type 2 diabetes mellitus with diabetic polyneuropathy: Secondary | ICD-10-CM | POA: Diagnosis not present

## 2020-09-24 DIAGNOSIS — I2510 Atherosclerotic heart disease of native coronary artery without angina pectoris: Secondary | ICD-10-CM | POA: Diagnosis not present

## 2020-09-24 DIAGNOSIS — E1169 Type 2 diabetes mellitus with other specified complication: Secondary | ICD-10-CM | POA: Diagnosis not present

## 2020-09-24 DIAGNOSIS — E785 Hyperlipidemia, unspecified: Secondary | ICD-10-CM | POA: Diagnosis not present

## 2020-10-17 DIAGNOSIS — E7800 Pure hypercholesterolemia, unspecified: Secondary | ICD-10-CM | POA: Diagnosis not present

## 2020-10-24 DIAGNOSIS — E1142 Type 2 diabetes mellitus with diabetic polyneuropathy: Secondary | ICD-10-CM | POA: Diagnosis not present

## 2020-10-24 DIAGNOSIS — Z6836 Body mass index (BMI) 36.0-36.9, adult: Secondary | ICD-10-CM | POA: Diagnosis not present

## 2020-10-24 DIAGNOSIS — I10 Essential (primary) hypertension: Secondary | ICD-10-CM | POA: Diagnosis not present

## 2020-10-24 DIAGNOSIS — G4733 Obstructive sleep apnea (adult) (pediatric): Secondary | ICD-10-CM | POA: Diagnosis not present

## 2020-10-24 DIAGNOSIS — K219 Gastro-esophageal reflux disease without esophagitis: Secondary | ICD-10-CM | POA: Diagnosis not present

## 2020-10-24 DIAGNOSIS — Z0000 Encounter for general adult medical examination without abnormal findings: Secondary | ICD-10-CM | POA: Diagnosis not present

## 2020-10-24 DIAGNOSIS — E7800 Pure hypercholesterolemia, unspecified: Secondary | ICD-10-CM | POA: Diagnosis not present

## 2020-12-03 DIAGNOSIS — R809 Proteinuria, unspecified: Secondary | ICD-10-CM | POA: Diagnosis not present

## 2020-12-03 DIAGNOSIS — E669 Obesity, unspecified: Secondary | ICD-10-CM | POA: Diagnosis not present

## 2020-12-03 DIAGNOSIS — E1169 Type 2 diabetes mellitus with other specified complication: Secondary | ICD-10-CM | POA: Diagnosis not present

## 2020-12-03 DIAGNOSIS — E785 Hyperlipidemia, unspecified: Secondary | ICD-10-CM | POA: Diagnosis not present

## 2020-12-03 DIAGNOSIS — E1129 Type 2 diabetes mellitus with other diabetic kidney complication: Secondary | ICD-10-CM | POA: Diagnosis not present

## 2020-12-03 DIAGNOSIS — E1159 Type 2 diabetes mellitus with other circulatory complications: Secondary | ICD-10-CM | POA: Diagnosis not present

## 2020-12-03 DIAGNOSIS — I152 Hypertension secondary to endocrine disorders: Secondary | ICD-10-CM | POA: Diagnosis not present

## 2021-03-18 DIAGNOSIS — E1129 Type 2 diabetes mellitus with other diabetic kidney complication: Secondary | ICD-10-CM | POA: Diagnosis not present

## 2021-03-18 DIAGNOSIS — E1169 Type 2 diabetes mellitus with other specified complication: Secondary | ICD-10-CM | POA: Diagnosis not present

## 2021-03-18 DIAGNOSIS — R0602 Shortness of breath: Secondary | ICD-10-CM | POA: Diagnosis not present

## 2021-03-18 DIAGNOSIS — E785 Hyperlipidemia, unspecified: Secondary | ICD-10-CM | POA: Diagnosis not present

## 2021-03-18 DIAGNOSIS — I152 Hypertension secondary to endocrine disorders: Secondary | ICD-10-CM | POA: Diagnosis not present

## 2021-03-18 DIAGNOSIS — I2510 Atherosclerotic heart disease of native coronary artery without angina pectoris: Secondary | ICD-10-CM | POA: Diagnosis not present

## 2021-03-18 DIAGNOSIS — R809 Proteinuria, unspecified: Secondary | ICD-10-CM | POA: Diagnosis not present

## 2021-03-18 DIAGNOSIS — E1159 Type 2 diabetes mellitus with other circulatory complications: Secondary | ICD-10-CM | POA: Diagnosis not present

## 2021-04-22 DIAGNOSIS — E1142 Type 2 diabetes mellitus with diabetic polyneuropathy: Secondary | ICD-10-CM | POA: Diagnosis not present

## 2021-04-22 DIAGNOSIS — E7800 Pure hypercholesterolemia, unspecified: Secondary | ICD-10-CM | POA: Diagnosis not present

## 2021-04-29 DIAGNOSIS — I10 Essential (primary) hypertension: Secondary | ICD-10-CM | POA: Diagnosis not present

## 2021-04-29 DIAGNOSIS — K219 Gastro-esophageal reflux disease without esophagitis: Secondary | ICD-10-CM | POA: Diagnosis not present

## 2021-04-29 DIAGNOSIS — M25551 Pain in right hip: Secondary | ICD-10-CM | POA: Diagnosis not present

## 2021-04-29 DIAGNOSIS — G4733 Obstructive sleep apnea (adult) (pediatric): Secondary | ICD-10-CM | POA: Diagnosis not present

## 2021-04-29 DIAGNOSIS — E7800 Pure hypercholesterolemia, unspecified: Secondary | ICD-10-CM | POA: Diagnosis not present

## 2021-04-29 DIAGNOSIS — E119 Type 2 diabetes mellitus without complications: Secondary | ICD-10-CM | POA: Diagnosis not present

## 2021-06-30 DIAGNOSIS — R809 Proteinuria, unspecified: Secondary | ICD-10-CM | POA: Diagnosis not present

## 2021-06-30 DIAGNOSIS — I152 Hypertension secondary to endocrine disorders: Secondary | ICD-10-CM | POA: Diagnosis not present

## 2021-06-30 DIAGNOSIS — E1169 Type 2 diabetes mellitus with other specified complication: Secondary | ICD-10-CM | POA: Diagnosis not present

## 2021-06-30 DIAGNOSIS — E1159 Type 2 diabetes mellitus with other circulatory complications: Secondary | ICD-10-CM | POA: Diagnosis not present

## 2021-06-30 DIAGNOSIS — E785 Hyperlipidemia, unspecified: Secondary | ICD-10-CM | POA: Diagnosis not present

## 2021-06-30 DIAGNOSIS — E1129 Type 2 diabetes mellitus with other diabetic kidney complication: Secondary | ICD-10-CM | POA: Diagnosis not present

## 2021-10-21 DIAGNOSIS — E7800 Pure hypercholesterolemia, unspecified: Secondary | ICD-10-CM | POA: Diagnosis not present

## 2021-10-28 DIAGNOSIS — E1140 Type 2 diabetes mellitus with diabetic neuropathy, unspecified: Secondary | ICD-10-CM | POA: Diagnosis not present

## 2021-10-28 DIAGNOSIS — I10 Essential (primary) hypertension: Secondary | ICD-10-CM | POA: Diagnosis not present

## 2021-10-28 DIAGNOSIS — Z0000 Encounter for general adult medical examination without abnormal findings: Secondary | ICD-10-CM | POA: Diagnosis not present

## 2021-10-28 DIAGNOSIS — K219 Gastro-esophageal reflux disease without esophagitis: Secondary | ICD-10-CM | POA: Diagnosis not present

## 2021-10-28 DIAGNOSIS — Z1389 Encounter for screening for other disorder: Secondary | ICD-10-CM | POA: Diagnosis not present

## 2021-10-28 DIAGNOSIS — E7800 Pure hypercholesterolemia, unspecified: Secondary | ICD-10-CM | POA: Diagnosis not present

## 2021-10-28 DIAGNOSIS — G4733 Obstructive sleep apnea (adult) (pediatric): Secondary | ICD-10-CM | POA: Diagnosis not present

## 2021-10-30 IMAGING — RF DG THORACIC SPINE 2V
1 series · 8 of 8 positions shown · non-contrast
Comparison: July 25, 2019

CLINICAL DATA: T11-T12 posterior fusion

EXAM:
THORACIC SPINE 2 VIEWS

[Series 1: unknown protocol · 0.20mm/px · 8 of 8 slices shown]
[im 1/8]
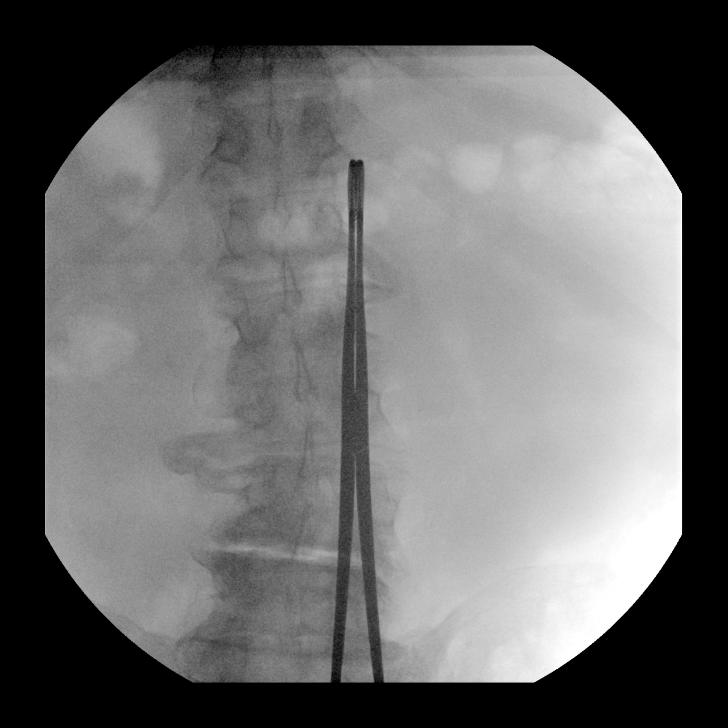
[im 2/8]
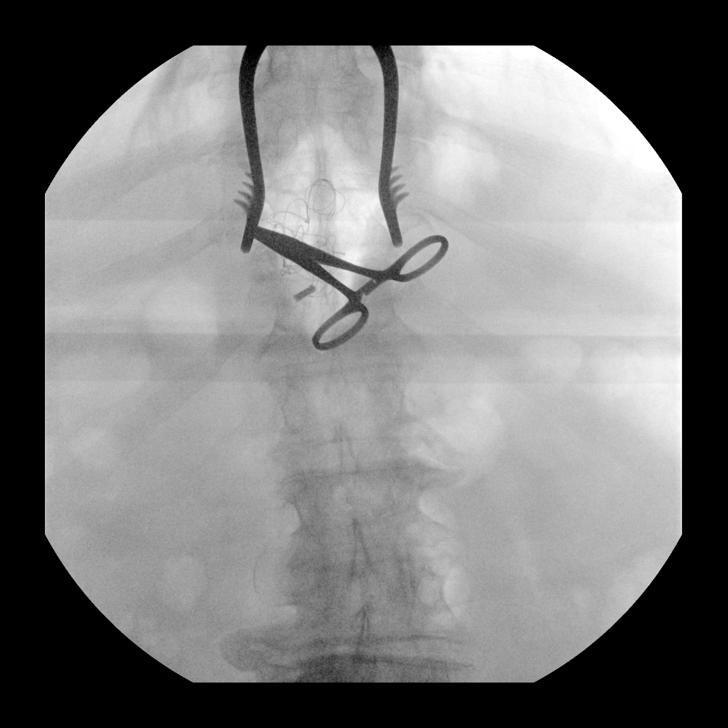
[im 3/8]
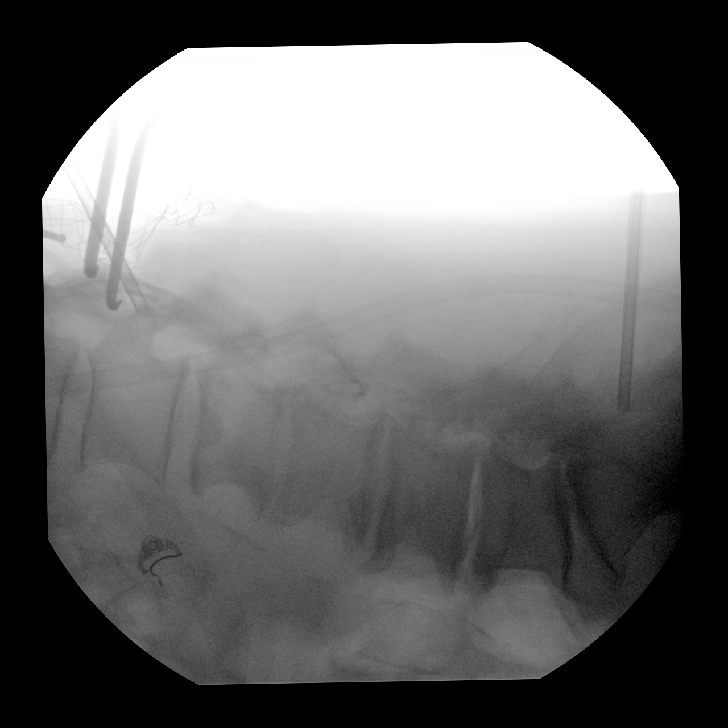
[im 4/8]
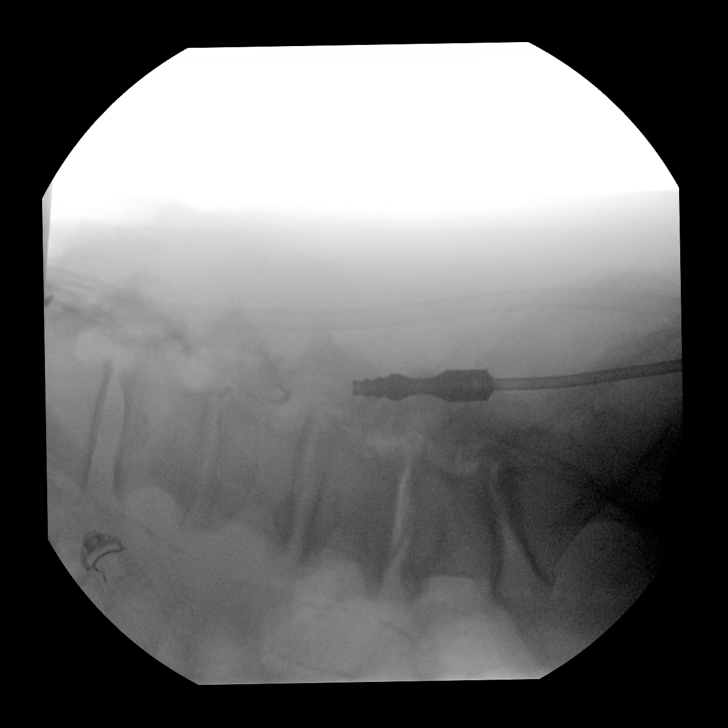
[im 5/8]
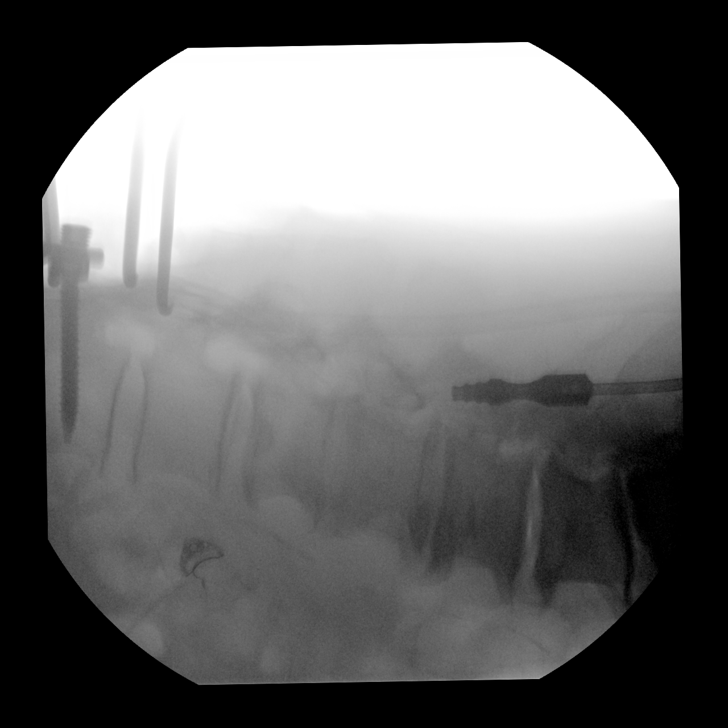
[im 6/8]
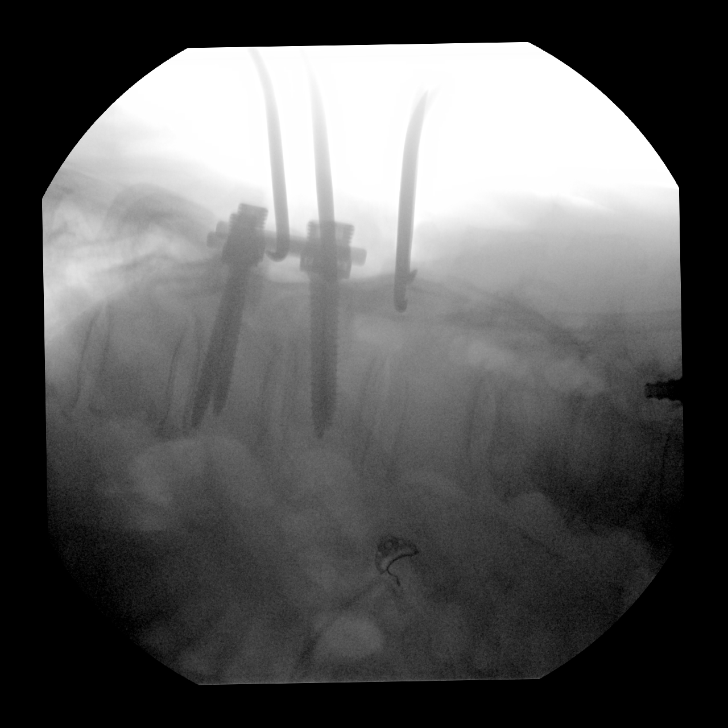
[im 7/8]
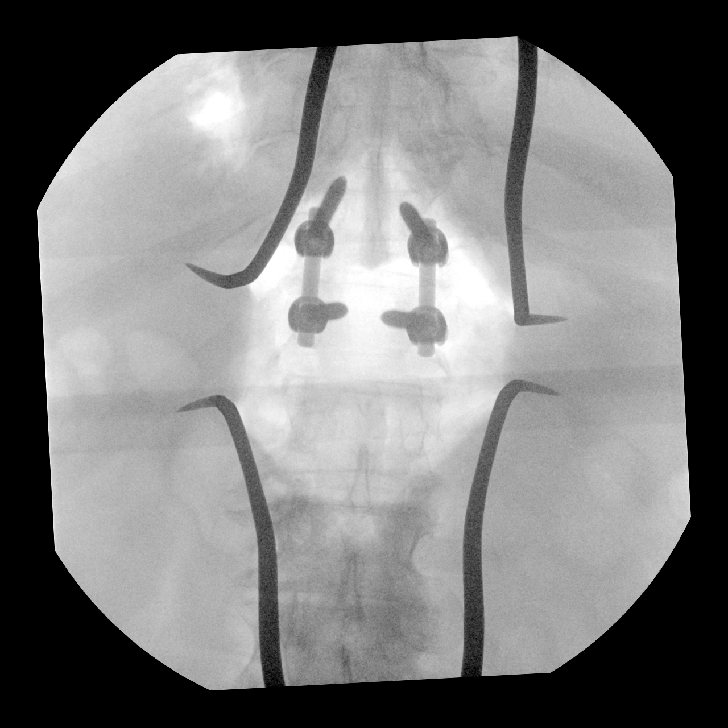
[im 8/8]
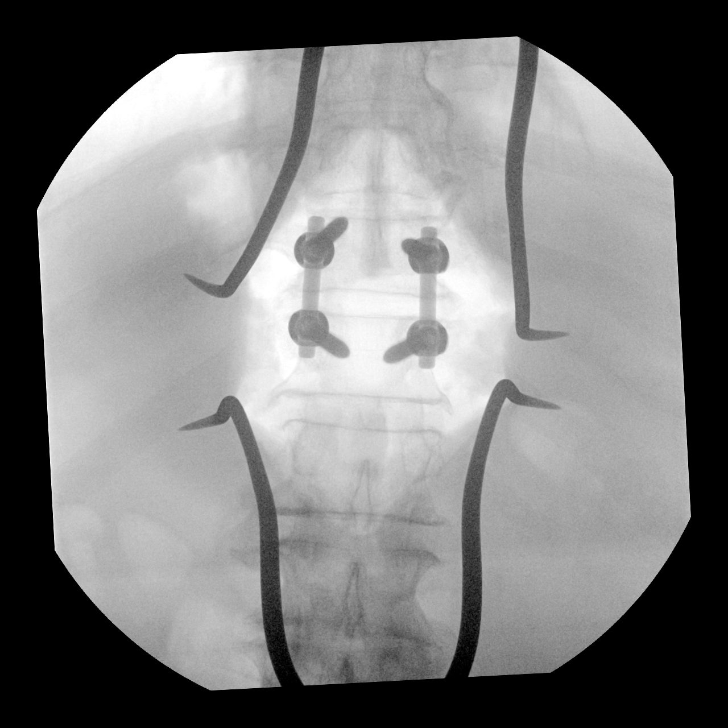

[8 of 8 positions shown; findings below may reference images not displayed]

FINDINGS: The patient has undergone instrumentation of the thoracic spine
level with reported fusion from T11-T12. The hardware appears
grossly intact.
IMPRESSION: Status post T11-T12 posterior fusion.

## 2021-10-30 IMAGING — RF DG C-ARM 1-60 MIN
1 series · 8 of 8 positions shown · non-contrast
Comparison: 07/23/2019

CLINICAL DATA: Surgery

EXAM:
DG C-ARM 1-60 MIN
FLUOROSCOPY TIME:  Fluoroscopy Time:  16 seconds
Number of Acquired Spot Images: 8

[Series 1: unknown protocol · 0.20mm/px · 8 of 8 slices shown]
[im 1/8]
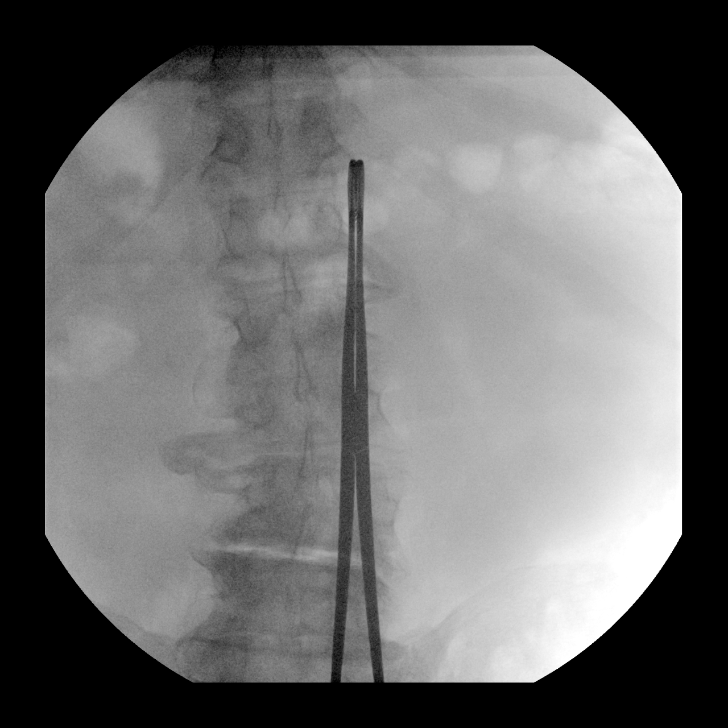
[im 2/8]
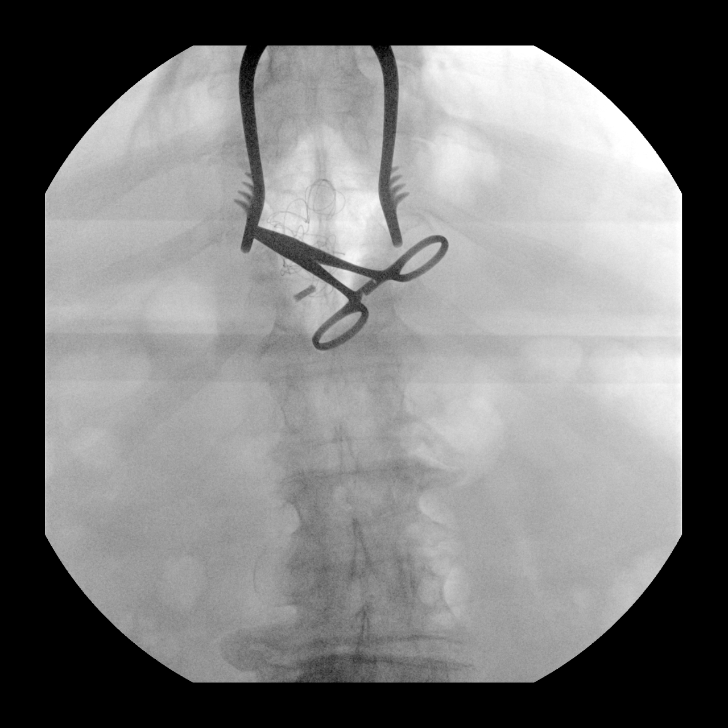
[im 3/8]
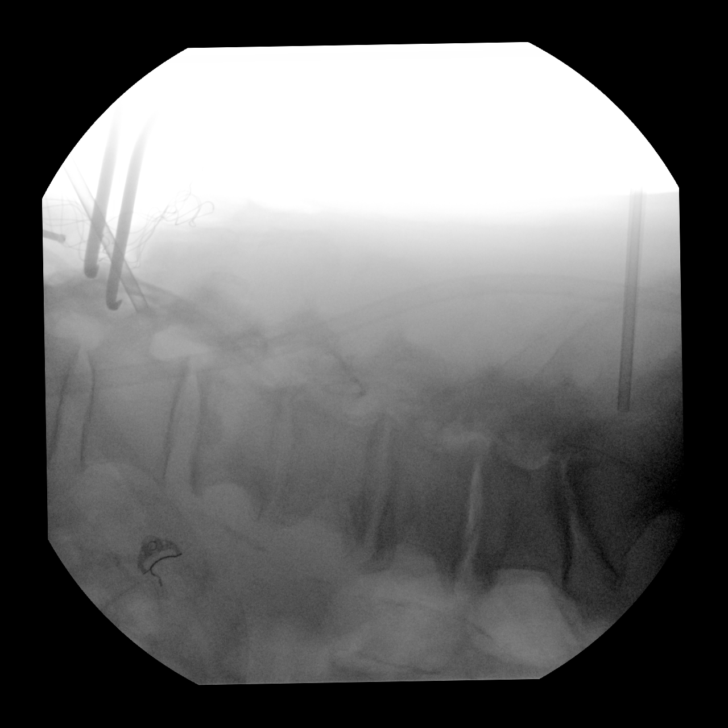
[im 4/8]
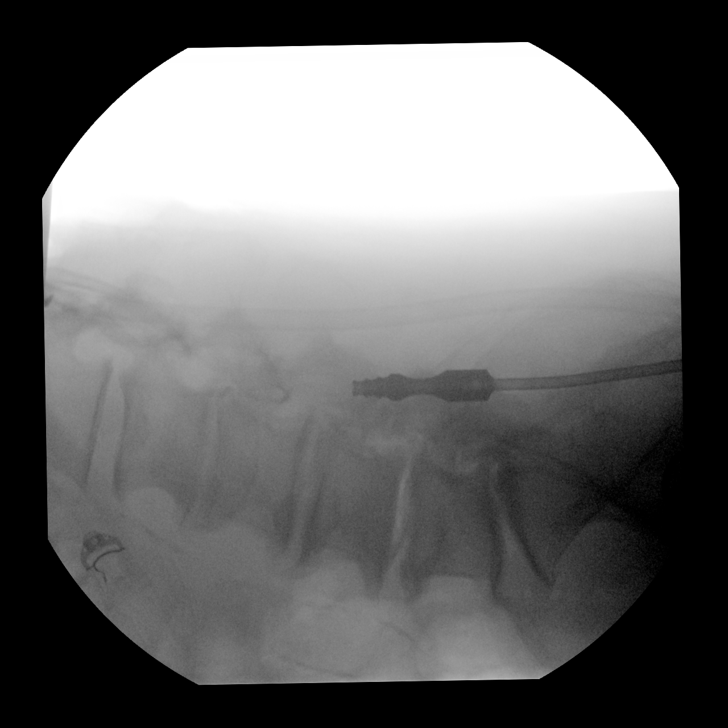
[im 5/8]
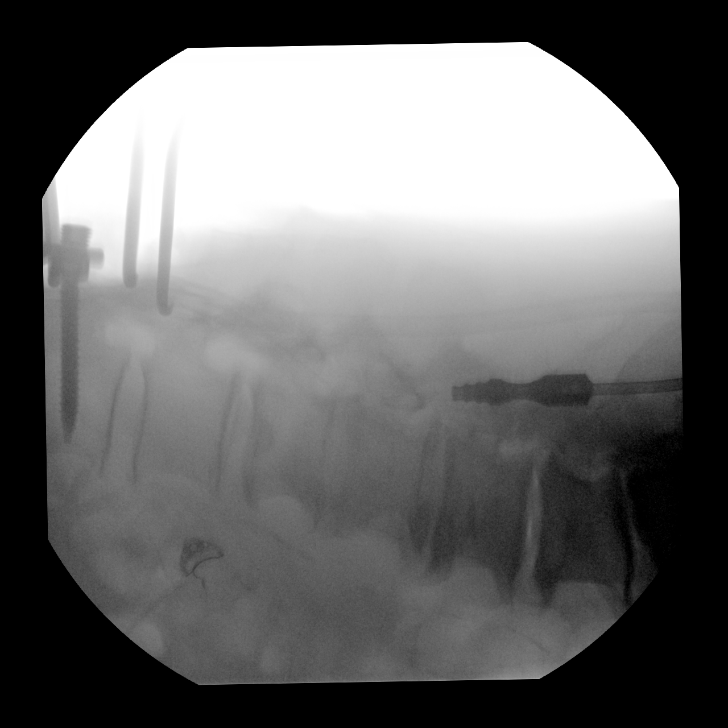
[im 6/8]
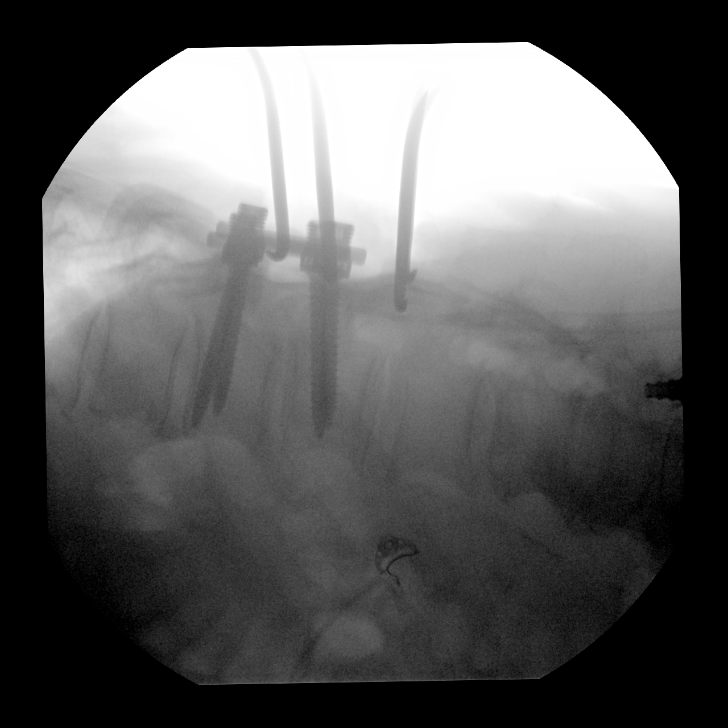
[im 7/8]
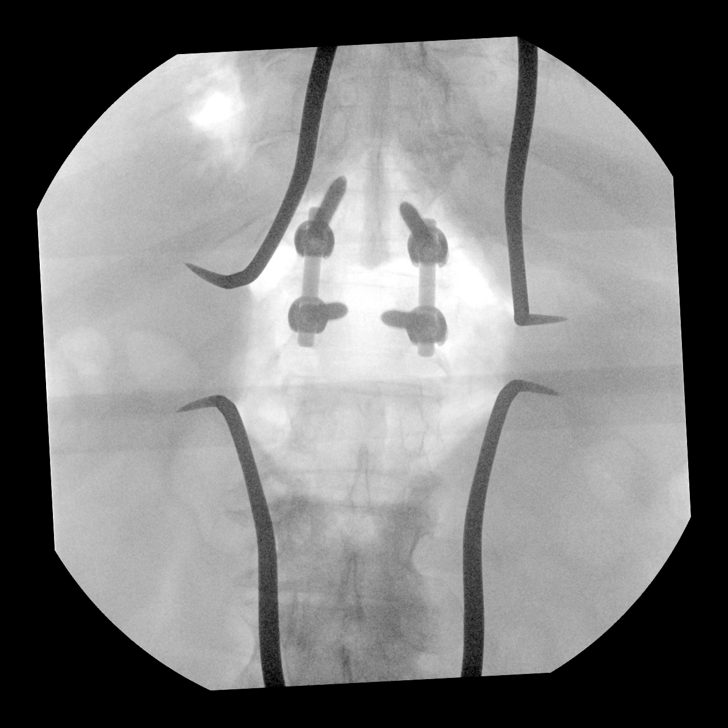
[im 8/8]
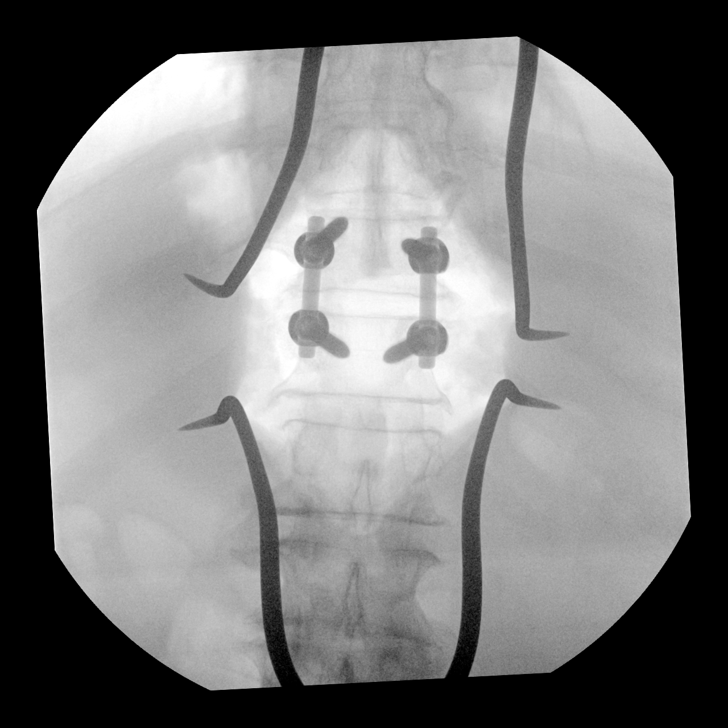

[8 of 8 positions shown; findings below may reference images not displayed]

FINDINGS: The patient has undergone instrumentation of the thoracic spine
level with reported fusion from T11-T12. The hardware appears
grossly intact.
IMPRESSION: Status post posterior fusion from T11 and T12 as reported.

## 2021-12-04 ENCOUNTER — Inpatient Hospital Stay
Admission: EM | Admit: 2021-12-04 | Discharge: 2021-12-06 | DRG: 872 | Disposition: A | Discharge status: Home / Self Care | Payer: Medicare HMO | Attending: Internal Medicine | Admitting: Internal Medicine

## 2021-12-04 ENCOUNTER — Encounter: Payer: Self-pay | Admitting: Emergency Medicine

## 2021-12-04 ENCOUNTER — Other Ambulatory Visit: Payer: Self-pay

## 2021-12-04 ENCOUNTER — Emergency Department: Payer: Medicare HMO

## 2021-12-04 DIAGNOSIS — Z833 Family history of diabetes mellitus: Secondary | ICD-10-CM

## 2021-12-04 DIAGNOSIS — R509 Fever, unspecified: Secondary | ICD-10-CM | POA: Diagnosis not present

## 2021-12-04 DIAGNOSIS — A419 Sepsis, unspecified organism: Secondary | ICD-10-CM

## 2021-12-04 DIAGNOSIS — R61 Generalized hyperhidrosis: Secondary | ICD-10-CM | POA: Diagnosis not present

## 2021-12-04 DIAGNOSIS — I712 Thoracic aortic aneurysm, without rupture, unspecified: Secondary | ICD-10-CM | POA: Diagnosis not present

## 2021-12-04 DIAGNOSIS — N179 Acute kidney failure, unspecified: Secondary | ICD-10-CM | POA: Diagnosis not present

## 2021-12-04 DIAGNOSIS — Z8711 Personal history of peptic ulcer disease: Secondary | ICD-10-CM | POA: Diagnosis not present

## 2021-12-04 DIAGNOSIS — Z981 Arthrodesis status: Secondary | ICD-10-CM | POA: Diagnosis not present

## 2021-12-04 DIAGNOSIS — A4150 Gram-negative sepsis, unspecified: Secondary | ICD-10-CM | POA: Diagnosis not present

## 2021-12-04 DIAGNOSIS — I358 Other nonrheumatic aortic valve disorders: Secondary | ICD-10-CM | POA: Diagnosis not present

## 2021-12-04 DIAGNOSIS — I2510 Atherosclerotic heart disease of native coronary artery without angina pectoris: Secondary | ICD-10-CM | POA: Diagnosis present

## 2021-12-04 DIAGNOSIS — Z79899 Other long term (current) drug therapy: Secondary | ICD-10-CM

## 2021-12-04 DIAGNOSIS — K219 Gastro-esophageal reflux disease without esophagitis: Secondary | ICD-10-CM | POA: Diagnosis not present

## 2021-12-04 DIAGNOSIS — I10 Essential (primary) hypertension: Secondary | ICD-10-CM | POA: Diagnosis not present

## 2021-12-04 DIAGNOSIS — Z8249 Family history of ischemic heart disease and other diseases of the circulatory system: Secondary | ICD-10-CM | POA: Diagnosis not present

## 2021-12-04 DIAGNOSIS — Z888 Allergy status to other drugs, medicaments and biological substances status: Secondary | ICD-10-CM

## 2021-12-04 DIAGNOSIS — R42 Dizziness and giddiness: Secondary | ICD-10-CM | POA: Diagnosis not present

## 2021-12-04 DIAGNOSIS — N390 Urinary tract infection, site not specified: Secondary | ICD-10-CM | POA: Diagnosis not present

## 2021-12-04 DIAGNOSIS — Z880 Allergy status to penicillin: Secondary | ICD-10-CM | POA: Diagnosis not present

## 2021-12-04 DIAGNOSIS — I252 Old myocardial infarction: Secondary | ICD-10-CM | POA: Diagnosis not present

## 2021-12-04 DIAGNOSIS — Z87891 Personal history of nicotine dependence: Secondary | ICD-10-CM | POA: Diagnosis not present

## 2021-12-04 DIAGNOSIS — Z7984 Long term (current) use of oral hypoglycemic drugs: Secondary | ICD-10-CM | POA: Diagnosis not present

## 2021-12-04 DIAGNOSIS — R000 Tachycardia, unspecified: Secondary | ICD-10-CM | POA: Diagnosis not present

## 2021-12-04 DIAGNOSIS — E119 Type 2 diabetes mellitus without complications: Secondary | ICD-10-CM | POA: Diagnosis not present

## 2021-12-04 DIAGNOSIS — K5730 Diverticulosis of large intestine without perforation or abscess without bleeding: Secondary | ICD-10-CM | POA: Diagnosis not present

## 2021-12-04 DIAGNOSIS — Z20822 Contact with and (suspected) exposure to covid-19: Secondary | ICD-10-CM | POA: Diagnosis not present

## 2021-12-04 DIAGNOSIS — Z823 Family history of stroke: Secondary | ICD-10-CM | POA: Diagnosis not present

## 2021-12-04 DIAGNOSIS — K429 Umbilical hernia without obstruction or gangrene: Secondary | ICD-10-CM | POA: Diagnosis not present

## 2021-12-04 DIAGNOSIS — E785 Hyperlipidemia, unspecified: Secondary | ICD-10-CM | POA: Diagnosis present

## 2021-12-04 DIAGNOSIS — R410 Disorientation, unspecified: Secondary | ICD-10-CM | POA: Diagnosis not present

## 2021-12-04 DIAGNOSIS — Z8673 Personal history of transient ischemic attack (TIA), and cerebral infarction without residual deficits: Secondary | ICD-10-CM | POA: Diagnosis not present

## 2021-12-04 LAB — URINALYSIS, ROUTINE W REFLEX MICROSCOPIC
Bilirubin Urine: NEGATIVE
Glucose, UA: >=500 mg/dL — AB
Ketones, ur: 5 mg/dL — AB
Nitrite: NEGATIVE
Protein, ur: 30 mg/dL — AB
Specific Gravity, Urine: 1.033 — ABNORMAL HIGH (ref 1.005–1.030)
WBC, UA: >50 WBC/hpf — ABNORMAL HIGH (ref 0–5)
pH: 5 (ref 5.0–8.0)

## 2021-12-04 LAB — BLOOD CULTURE ID PANEL (REFLEXED) - BCID2

## 2021-12-04 LAB — COMPREHENSIVE METABOLIC PANEL
ALT: 21 U/L (ref 0–44)
AST: 19 U/L (ref 15–41)
Albumin: 3.8 g/dL (ref 3.5–5.0)
Alkaline Phosphatase: 85 U/L (ref 38–126)
Anion gap: 9 (ref 5–15)
BUN: 27 mg/dL — ABNORMAL HIGH (ref 8–23)
CO2: 26 mmol/L (ref 22–32)
Calcium: 9.1 mg/dL (ref 8.9–10.3)
Chloride: 102 mmol/L (ref 98–111)
Creatinine, Ser: 1.35 mg/dL — ABNORMAL HIGH (ref 0.61–1.24)
GFR, Estimated: 55 mL/min — ABNORMAL LOW (ref >60)
Glucose, Bld: 205 mg/dL — ABNORMAL HIGH (ref 70–99)
Potassium: 4.2 mmol/L (ref 3.5–5.1)
Sodium: 137 mmol/L (ref 135–145)
Total Bilirubin: 1.2 mg/dL (ref 0.3–1.2)
Total Protein: 7.3 g/dL (ref 6.5–8.1)

## 2021-12-04 LAB — RESP PANEL BY RT-PCR (FLU A&B, COVID) ARPGX2
Influenza A by PCR: NEGATIVE
Influenza B by PCR: NEGATIVE
SARS Coronavirus 2 by RT PCR: NEGATIVE

## 2021-12-04 LAB — CBC WITH DIFFERENTIAL/PLATELET
Abs Immature Granulocytes: 0.11 10*3/uL — ABNORMAL HIGH (ref 0.00–0.07)
Basophils Absolute: 0.1 10*3/uL (ref 0.0–0.1)
Basophils Relative: 0 %
Eosinophils Absolute: 0.1 10*3/uL (ref 0.0–0.5)
Eosinophils Relative: 1 %
HCT: 47.9 % (ref 39.0–52.0)
Hemoglobin: 16 g/dL (ref 13.0–17.0)
Immature Granulocytes: 1 %
Lymphocytes Relative: 6 %
Lymphs Abs: 1 10*3/uL (ref 0.7–4.0)
MCH: 30 pg (ref 26.0–34.0)
MCHC: 33.4 g/dL (ref 30.0–36.0)
MCV: 89.9 fL (ref 80.0–100.0)
Monocytes Absolute: 0.5 10*3/uL (ref 0.1–1.0)
Monocytes Relative: 3 %
Neutro Abs: 16.4 10*3/uL — ABNORMAL HIGH (ref 1.7–7.7)
Neutrophils Relative %: 89 %
Platelets: 261 10*3/uL (ref 150–400)
RBC: 5.33 MIL/uL (ref 4.22–5.81)
RDW: 13.4 % (ref 11.5–15.5)
WBC: 18.3 10*3/uL — ABNORMAL HIGH (ref 4.0–10.5)
nRBC: 0 % (ref 0.0–0.2)

## 2021-12-04 LAB — HEMOGLOBIN A1C
Hgb A1c MFr Bld: 7.9 % — ABNORMAL HIGH (ref 4.8–5.6)
Mean Plasma Glucose: 180.03 mg/dL

## 2021-12-04 LAB — TROPONIN I (HIGH SENSITIVITY)
Troponin I (High Sensitivity): 19 ng/L — ABNORMAL HIGH (ref <18)
Troponin I (High Sensitivity): 30 ng/L — ABNORMAL HIGH (ref <18)

## 2021-12-04 LAB — LACTIC ACID, PLASMA: Lactic Acid, Venous: 1.7 mmol/L (ref 0.5–1.9)

## 2021-12-04 LAB — CBG MONITORING, ED
Glucose-Capillary: 219 mg/dL — ABNORMAL HIGH (ref 70–99)
Glucose-Capillary: 199 mg/dL — ABNORMAL HIGH (ref 70–99)

## 2021-12-04 LAB — GLUCOSE, CAPILLARY
Glucose-Capillary: 152 mg/dL — ABNORMAL HIGH (ref 70–99)
Glucose-Capillary: 137 mg/dL — ABNORMAL HIGH (ref 70–99)

## 2021-12-04 LAB — PROCALCITONIN: Procalcitonin: 0.39 ng/mL

## 2021-12-04 MED ORDER — ACETAMINOPHEN 500 MG PO TABS
1000.0000 mg | ORAL_TABLET | Freq: Once | ORAL | Status: AC
Start: 2021-12-04 — End: 2021-12-04
  Administered 2021-12-04: 1000 mg via ORAL
  Filled 2021-12-04: qty 2

## 2021-12-04 MED ORDER — ACETAMINOPHEN 650 MG RE SUPP: 650.0000 mg | Freq: Four times a day (QID) | RECTAL | Status: DC | PRN

## 2021-12-04 MED ORDER — IOHEXOL 300 MG/ML  SOLN
100.0000 mL | Freq: Once | INTRAMUSCULAR | Status: AC | PRN
  Administered 2021-12-04: 100 mL via INTRAVENOUS

## 2021-12-04 MED ORDER — ONDANSETRON HCL 4 MG/2ML IJ SOLN: 4.0000 mg | Freq: Four times a day (QID) | INTRAMUSCULAR | Status: DC | PRN

## 2021-12-04 MED ORDER — INSULIN ASPART 100 UNIT/ML IJ SOLN
0.0000 [IU] | Freq: Three times a day (TID) | INTRAMUSCULAR | Status: DC
  Administered 2021-12-04: 1 [IU] via SUBCUTANEOUS
  Administered 2021-12-04 (×2): 2 [IU] via SUBCUTANEOUS
  Administered 2021-12-05 (×3): 1 [IU] via SUBCUTANEOUS
  Administered 2021-12-06: 2 [IU] via SUBCUTANEOUS
  Filled 2021-12-04 (×7): qty 1

## 2021-12-04 MED ORDER — TRAZODONE HCL 50 MG PO TABS: 25.0000 mg | ORAL_TABLET | Freq: Every evening | ORAL | Status: DC | PRN

## 2021-12-04 MED ORDER — ONDANSETRON HCL 4 MG PO TABS: 4.0000 mg | ORAL_TABLET | Freq: Four times a day (QID) | ORAL | Status: DC | PRN

## 2021-12-04 MED ORDER — SODIUM CHLORIDE 0.9 % IV BOLUS (SEPSIS)
1000.0000 mL | Freq: Once | INTRAVENOUS | Status: AC
  Administered 2021-12-04: 1000 mL via INTRAVENOUS

## 2021-12-04 MED ORDER — CEFTRIAXONE SODIUM 2 G IJ SOLR
2.0000 g | Freq: Once | INTRAMUSCULAR | Status: AC
Start: 2021-12-04 — End: 2021-12-04
  Administered 2021-12-04: 2 g via INTRAVENOUS
  Filled 2021-12-04: qty 20

## 2021-12-04 MED ORDER — ASCORBIC ACID 500 MG PO TABS
1000.0000 mg | ORAL_TABLET | Freq: Every day | ORAL | Status: DC
  Administered 2021-12-04 – 2021-12-06 (×3): 1000 mg via ORAL
  Filled 2021-12-04 (×3): qty 2

## 2021-12-04 MED ORDER — MECLIZINE HCL 25 MG PO TABS
25.0000 mg | ORAL_TABLET | Freq: Once | ORAL | Status: AC
  Administered 2021-12-04: 25 mg via ORAL
  Filled 2021-12-04: qty 1

## 2021-12-04 MED ORDER — PANTOPRAZOLE SODIUM 40 MG PO TBEC: 40.0000 mg | DELAYED_RELEASE_TABLET | Freq: Every day | ORAL | Status: DC

## 2021-12-04 MED ORDER — SODIUM CHLORIDE 0.9 % IV SOLN: INTRAVENOUS | Status: DC

## 2021-12-04 MED ORDER — VITAMIN B-12 1000 MCG PO TABS
1000.0000 ug | ORAL_TABLET | Freq: Every day | ORAL | Status: DC
  Administered 2021-12-04 – 2021-12-06 (×3): 1000 ug via ORAL
  Filled 2021-12-04 (×3): qty 1

## 2021-12-04 MED ORDER — ATORVASTATIN CALCIUM 20 MG PO TABS
20.0000 mg | ORAL_TABLET | Freq: Every day | ORAL | Status: DC
  Administered 2021-12-04 – 2021-12-06 (×3): 20 mg via ORAL
  Filled 2021-12-04 (×3): qty 1

## 2021-12-04 MED ORDER — SODIUM CHLORIDE 0.9 % IV SOLN
2.0000 g | INTRAVENOUS | Status: DC
  Administered 2021-12-05 – 2021-12-06 (×2): 2 g via INTRAVENOUS
  Filled 2021-12-04 (×2): qty 2

## 2021-12-04 MED ORDER — MAGNESIUM OXIDE 400 MG PO TABS
400.0000 mg | ORAL_TABLET | Freq: Every day | ORAL | Status: DC
  Administered 2021-12-04 – 2021-12-06 (×3): 400 mg via ORAL
  Filled 2021-12-04 (×6): qty 1

## 2021-12-04 MED ORDER — OMEGA-3-ACID ETHYL ESTERS 1 G PO CAPS
1.0000 | ORAL_CAPSULE | Freq: Two times a day (BID) | ORAL | Status: DC
  Administered 2021-12-04 – 2021-12-06 (×5): 1 g via ORAL
  Filled 2021-12-04 (×5): qty 1

## 2021-12-04 MED ORDER — GABAPENTIN 300 MG PO CAPS
300.0000 mg | ORAL_CAPSULE | Freq: Every day | ORAL | Status: DC
  Administered 2021-12-04 – 2021-12-05 (×2): 300 mg via ORAL
  Filled 2021-12-04 (×2): qty 1

## 2021-12-04 MED ORDER — ACETAMINOPHEN 325 MG PO TABS: 650.0000 mg | ORAL_TABLET | Freq: Four times a day (QID) | ORAL | Status: DC | PRN

## 2021-12-04 MED ORDER — AMLODIPINE BESYLATE 10 MG PO TABS
10.0000 mg | ORAL_TABLET | Freq: Every day | ORAL | Status: DC
  Administered 2021-12-04 – 2021-12-06 (×3): 10 mg via ORAL
  Filled 2021-12-04 (×2): qty 1
  Filled 2021-12-04: qty 2

## 2021-12-04 MED ORDER — ENOXAPARIN SODIUM 60 MG/0.6ML IJ SOSY
0.5000 mg/kg | PREFILLED_SYRINGE | INTRAMUSCULAR | Status: DC
  Administered 2021-12-04 – 2021-12-05 (×2): 57.5 mg via SUBCUTANEOUS
  Filled 2021-12-04 (×2): qty 0.6

## 2021-12-04 NOTE — Assessment & Plan Note (Addendum)
Continue statin. 

## 2021-12-04 NOTE — ED Provider Notes (Signed)
? ?[facility] ?Provider Note ? ? ? Event Date/Time  ? First MD Initiated Contact with Patient 12/04/21 0136   ?  (approximate) ? ? ?History  ? ?Shaking and Night Sweats ? ? ?HPI ? ?Jordan [name] is a 76 y.o. male history of coronary artery disease, hypertension, diabetes, hyperlipidemia who presents to the emergency department with concerns for shaking chills and night sweats that woke him from sleep.  No known fever.  Denies chest pain, shortness of breath, cough, nausea, vomiting, diarrhea, dysuria, rash, tick bites, sick contacts, recent travel.  Does state he has been intermittently dizzy which she describes as feeling lightheaded and like the room is moving over the past few days.  No numbness, tingling or weakness.  No vision changes.  No headache or head injury. ? ? ?History provided by patient and wife. ? ? ? ?Past Medical History:  ?Diagnosis Date  ? Anemia   ? Arthritis   ? Coronary artery disease   ? Diabetes mellitus without complication ([place])   ? Diverticulosis   ? Duodenal ulcer   ? Erectile dysfunction   ? GERD (gastroesophageal reflux disease)   ? Hyperlipidemia   ? Hypertension   ? ? ?Past Surgical History:  ?Procedure Laterality Date  ? CARDIAC CATHETERIZATION    ? COLONOSCOPY WITH PROPOFOL N/A 05/31/2017  ? Procedure: COLONOSCOPY WITH PROPOFOL;  Surgeon: Manya Silvas, MD;  Location: [facility] ENDOSCOPY;  Service: Endoscopy;  Laterality: N/A;  ? JOINT REPLACEMENT Left   ? knee  ? THORACIC DISCECTOMY N/A 08/09/2019  ? Procedure: T11-12 POSTERIOR FUSION WITH TRANSPEDICULAR DECOMPRESSION;  Surgeon: Meade Maw, MD;  Location: [facility] ORS;  Service: Neurosurgery;  Laterality: N/A;  ? VASECTOMY    ? ? ?MEDICATIONS:  ?Prior to Admission medications   ?Medication Sig Start Date End Date Taking? Authorizing Provider  ?amLODipine (NORVASC) 10 MG tablet Take 10 mg by mouth daily.    [provider]  ?ascorbic acid (VITAMIN C) 1000 MG tablet Take 1,000 mg by mouth daily.     [provider]  ?atorvastatin (LIPITOR) 20 MG tablet Take 20 mg by mouth daily.    [provider]  ?cholecalciferol (VITAMIN D) 25 MCG (1000 UT) tablet Take 1,000 Units by mouth daily.    [provider]  ?CINNAMON PO Take 1,000 mg by mouth daily.     [provider]  ?esomeprazole (NEXIUM) 20 MG capsule Take 20 mg by mouth daily at 12 noon.    [provider]  ?gabapentin (NEURONTIN) 100 MG capsule Take 300 mg by mouth at bedtime. 07/12/19   [provider]  ?glipiZIDE (GLUCOTROL) 5 MG tablet Take 5 mg by mouth daily. 07/17/19   [provider]  ?hydrochlorothiazide (HYDRODIURIL) 25 MG tablet Take 25 mg by mouth daily.    [provider]  ?magnesium oxide (MAG-OX) 400 MG tablet Take 400 mg by mouth daily.    [provider]  ?metFORMIN (GLUCOPHAGE) 1000 MG tablet Take 1,000 mg by mouth 2 (two) times daily with a meal.    [provider]  ?methocarbamol (ROBAXIN) 500 MG tablet Take 1 tablet (500 mg total) by mouth every 6 (six) hours as needed for muscle spasms. 08/11/19   Marin Olp, PA-C  ?omega-3 acid ethyl esters (LOVAZA) 1 g capsule Take 1 capsule by mouth 2 (two) times daily.    [provider]  ?ramipril (ALTACE) 10 MG capsule Take 10 mg by mouth 2 (two) times daily.  [provider]  ?vitamin B-12 (CYANOCOBALAMIN) 1000 MCG tablet Take 1,000 mcg by mouth daily.    [provider]  ? ? ?Physical Exam  ? ?Triage Vital Signs: ?ED Triage Vitals [12/04/21 0134]  ?Enc Vitals Group  ?   BP 123/70  ?   Pulse Rate 95  ?   Resp 20  ?   Temp 98 ?F (36.7 ?C)  ?   Temp Source Oral  ?   SpO2 94 %  ?   Weight 250 lb (113.4 kg)  ?   Height '5\' 11"'$  (1.803 m)  ?   Head Circumference   ?   Peak Flow   ?   Pain Score 0  ?   Pain Loc   ?   Pain Edu?   ?   Excl. in [place]?   ? ? ?Most recent vital signs: ?Vitals:  ? 12/04/21 0501 12/04/21 0530  ?BP: (!) 112/59 (!) 128/59  ?Pulse: 74 73  ?Resp: 19 19  ?Temp:     ?SpO2: 93% 91%  ? ? ?CONSTITUTIONAL: Alert and oriented and responds appropriately to questions. Well-appearing; well-nourished, elderly, in no distress, nontoxic ?HEAD: Normocephalic, atraumatic ?EYES: Conjunctivae clear, pupils appear equal, sclera nonicteric ?ENT: normal nose; moist mucous membranes ?NECK: Supple, normal ROM ?CARD: RRR; S1 and S2 appreciated; no murmurs, no clicks, no rubs, no gallops ?RESP: Normal chest excursion without splinting or tachypnea; breath sounds clear and equal bilaterally; no wheezes, no rhonchi, no rales, no hypoxia or respiratory distress, speaking full sentences ?ABD/GI: Normal bowel sounds; non-distended; soft, non-tender, no rebound, no guarding, no peritoneal signs ?BACK: The back appears normal ?EXT: Normal ROM in all joints; no deformity noted, no edema; no cyanosis ?SKIN: Normal color for age and race; warm; no rash on exposed skin ?NEURO: Moves all extremities equally, normal speech, normal sensation diffusely, cranial nerves II through XII intact, no dysmetria to finger-nose testing bilaterally ?PSYCH: The patient's mood and manner are appropriate. ? ? ?ED Results / Procedures / Treatments  ? ?LABS: ?(all labs ordered are listed, but only abnormal results are displayed) ?Labs Reviewed  ?CBC WITH DIFFERENTIAL/PLATELET - Abnormal; Notable for the following components:  ?    Result Value  ? WBC 18.3 (*)   ? Neutro Abs 16.4 (*)   ? Abs Immature Granulocytes 0.11 (*)   ? All other components within normal limits  ?COMPREHENSIVE METABOLIC PANEL - Abnormal; Notable for the following components:  ? Glucose, Bld 205 (*)   ? BUN 27 (*)   ? Creatinine, Ser 1.35 (*)   ? GFR, Estimated 55 (*)   ? All other components within normal limits  ?URINALYSIS, ROUTINE W REFLEX MICROSCOPIC - Abnormal; Notable for the following components:  ? Color, Urine YELLOW (*)   ? APPearance CLOUDY (*)   ? Specific Gravity, Urine 1.033 (*)   ? Glucose, UA >=500 (*)   ? Hgb urine dipstick LARGE (*)   ?  Ketones, ur 5 (*)   ? Protein, ur 30 (*)   ? Leukocytes,Ua MODERATE (*)   ? WBC, UA >50 (*)   ? Bacteria, UA MANY (*)   ? All other components within normal limits  ?CBG MONITORING, ED - Abnormal; Notable for the following components:  ? Glucose-Capillary 219 (*)   ? All other components within normal limits  ?TROPONIN I (HIGH SENSITIVITY) - Abnormal; Notable for the following components:  ? Troponin I (High Sensitivity) 19 (*)   ? All other components within normal  limits  ?TROPONIN I (HIGH SENSITIVITY) - Abnormal; Notable for the following components:  ? Troponin I (High Sensitivity) 30 (*)   ? All other components within normal limits  ?RESP PANEL BY RT-PCR (FLU A&B, COVID) [id number]  ?CULTURE, BLOOD (ROUTINE X 2)  ?CULTURE, BLOOD (ROUTINE X 2)  ?URINE CULTURE  ?PROCALCITONIN  ?LACTIC ACID, PLASMA  ? ? ? ?EKG: ? EKG Interpretation ? ?Date/Time:  Thursday Dec 04 2021 01:34:56 EDT ?Ventricular Rate:  94 ?PR Interval:  169 ?QRS Duration: 84 ?QT Interval:  388 ?QTC Calculation: 486 ?R Axis:   -27 ?Text Interpretation: Sinus rhythm Atrial premature complex Left axis deviation Anterior infarct, old Confirmed by Crystalmarie Yasin, Cyril Mourning [phone]) on 12/04/2021 2:08:04 AM ?  ? ?  ? ? ? ?RADIOLOGY: ?My personal review and interpretation of imaging: Patient's MRI brain shows no acute infarct.  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  Also has signs of an ascending UTI.   ? ?I have personally reviewed all radiology reports.   ?MR BRAIN WO CONTRAST ? ?Result Date: 12/04/2021 ?CLINICAL DATA:  Dizziness EXAM: MRI HEAD WITHOUT CONTRAST TECHNIQUE: Multiplanar, multiecho pulse sequences of the brain and surrounding structures were obtained without intravenous contrast. COMPARISON:  None Available. FINDINGS: Brain: No acute infarct, mass effect or extra-axial collection. No acute or chronic hemorrhage. There is multifocal hyperintense T2-weighted signal within the white matter. Parenchymal volume and CSF spaces  are normal. The midline structures are normal. Vascular: Major flow voids are preserved. Skull and upper cervical spine: Normal calvarium and skull base. Visualized upper cervical spine and soft tissues are normal

## 2021-12-04 NOTE — Assessment & Plan Note (Addendum)
Ruled out.  Patient presented with creatinine of 1.35, only up slightly from baseline of 1.2 a little over a month ago. ?Renal function appears near baseline. ?-- Monitor BMP ?

## 2021-12-04 NOTE — Progress Notes (Signed)
PHARMACY - PHYSICIAN COMMUNICATION ?CRITICAL VALUE ALERT - BLOOD CULTURE IDENTIFICATION (BCID) ? ?Jordan [name] is an 75 y.o. male who presented to [facility] on 12/04/2021 with a chief complaint of night sweats and dizziness ? ?Assessment:  Blood cultures with GNR, BCID detects E coli. Concern for UTI ? ?Name of physician (or Provider) Contacted: Dr Netta Cedars ? ?Current antibiotics:  ceftriaxone ? ?Changes to prescribed antibiotics recommended:  ?Patient is on recommended antibiotics - No changes needed ? ?Results for orders placed or performed during the hospital encounter of 12/04/21  ?Blood Culture ID Panel (Reflexed) (Collected: 12/04/2021  2:15 AM)  ?Result Value Ref Range  ? Enterococcus faecalis NOT DETECTED NOT DETECTED  ? Enterococcus Faecium NOT DETECTED NOT DETECTED  ? Listeria monocytogenes NOT DETECTED NOT DETECTED  ? Staphylococcus species NOT DETECTED NOT DETECTED  ? Staphylococcus aureus (BCID) NOT DETECTED NOT DETECTED  ? Staphylococcus epidermidis NOT DETECTED NOT DETECTED  ? Staphylococcus lugdunensis NOT DETECTED NOT DETECTED  ? Streptococcus species NOT DETECTED NOT DETECTED  ? Streptococcus agalactiae NOT DETECTED NOT DETECTED  ? Streptococcus pneumoniae NOT DETECTED NOT DETECTED  ? Streptococcus pyogenes NOT DETECTED NOT DETECTED  ? A.calcoaceticus-baumannii NOT DETECTED NOT DETECTED  ? Bacteroides fragilis NOT DETECTED NOT DETECTED  ? Enterobacterales DETECTED (A) NOT DETECTED  ? Enterobacter cloacae complex NOT DETECTED NOT DETECTED  ? Escherichia coli DETECTED (A) NOT DETECTED  ? Klebsiella aerogenes NOT DETECTED NOT DETECTED  ? Klebsiella oxytoca NOT DETECTED NOT DETECTED  ? Klebsiella pneumoniae NOT DETECTED NOT DETECTED  ? Proteus species NOT DETECTED NOT DETECTED  ? Salmonella species NOT DETECTED NOT DETECTED  ? Serratia marcescens NOT DETECTED NOT DETECTED  ? Haemophilus influenzae NOT DETECTED NOT DETECTED  ? Neisseria meningitidis NOT DETECTED NOT DETECTED  ? Pseudomonas aeruginosa NOT  DETECTED NOT DETECTED  ? Stenotrophomonas maltophilia NOT DETECTED NOT DETECTED  ? Candida albicans NOT DETECTED NOT DETECTED  ? Candida auris NOT DETECTED NOT DETECTED  ? Candida glabrata NOT DETECTED NOT DETECTED  ? Candida krusei NOT DETECTED NOT DETECTED  ? Candida parapsilosis NOT DETECTED NOT DETECTED  ? Candida tropicalis NOT DETECTED NOT DETECTED  ? Cryptococcus neoformans/gattii NOT DETECTED NOT DETECTED  ? CTX-M ESBL NOT DETECTED NOT DETECTED  ? Carbapenem resistance IMP NOT DETECTED NOT DETECTED  ? Carbapenem resistance KPC NOT DETECTED NOT DETECTED  ? Carbapenem resistance NDM NOT DETECTED NOT DETECTED  ? Carbapenem resist OXA 48 LIKE NOT DETECTED NOT DETECTED  ? Carbapenem resistance VIM NOT DETECTED NOT DETECTED  ? ? ?Jordan [name], PharmD, BCPS, BCIDP ?Work Cell: [phone] ?12/04/2021 2:40 PM ? ? ? ?

## 2021-12-04 NOTE — Hospital Course (Addendum)
76 year old male with past medical history of type 2 diabetes, hypertension, prior stroke, cancer and CAD with history of MI, GERD, duodenal ulcer, diverticulosis presented to the ER on 12/04/2021 with dizziness and fever/chills, urinary urgency/frequency without dysuria hematuria or flank pain. ? ?Evaluation in the ED consistent with UTI. ?Patient admitted to hospitalist service and started on empiric antibiotics pending cultures. ? ?Urine and admission blood cultures both grew E. coli, pan-sensitive. ? ?5/6 - pt clinically improved and medically stable for discharge on PO antibiotics to complete course of therapy. ?

## 2021-12-04 NOTE — Assessment & Plan Note (Addendum)
Continue Norvasc and Coreg.   ?HCTZ and ramipril were initially held, resumed.  ?PCP follow up. ? ?

## 2021-12-04 NOTE — H&P (Signed)
?  ?  ?[facility] ? ? ?PATIENT NAME: Jordan [name]   ? ?MR#:  [id number] ? ?DATE OF BIRTH:  1946/06/22 ? ?DATE OF ADMISSION:  12/04/2021 ? ?PRIMARY CARE PHYSICIAN: Sofie Hartigan, MD  ? ?Patient is coming from: Home ? ?REQUESTING/REFERRING PHYSICIAN: Ward, Delice Bison, DO ? ?CHIEF COMPLAINT:  ? ?Chief Complaint  ?Patient presents with  ?? Shaking  ?? Night Sweats  ? ? ?HISTORY OF PRESENT ILLNESS:  ?Jordan [name] is a 76 y.o. Caucasian positive for diabetes mellitus, hypertension, CVA, cancer and MI.  male with medical history significant for type II diabetes mellitus, hypertension, CAD, hyperlipidemia, GERD, duodenal ulcer and diverticulosis, who presented to the ER with acute onset of dizziness and mild vertigo for the last couple of days.  He admits to urinary frequency and urgency without dysuria or hematuria or flank pain.  He has been having associated fever and chills with night sweats.  No sore throat or earache or cough or dyspnea.  No chest pain or palpitations.  No nausea or vomiting or abdominal pain ? ?ED Course: When he came to the ER vital signs were within normal and later temperature was 103.7 rate of 21 and later 26.  Labs revealed a BUN of 27 and creatinine of 1.35 with high-sensitivity troponin I of 19 and later 30.  CBC showed leukocytosis 18.3 with neutrophilia.  Influenza antigens and COVID-19 PCR came back negative.  Blood cultures were drawn. ?EKG as reviewed by me : EKG showed normal sinus rhythm with a rate of 94 with PACs, Left axis deviation Q waves inferiorly. ?Imaging: Abdominal pelvic CT scan revealed: ?IMPRESSION: ?1. No acute findings in the chest. There is coronary artery and ?aortic atherosclerosis, mitral annular calcifications. ?2. Calcifications and thickening of the aortic valve. Consider ?echocardiographic workup for aortic stenosis. ?3. Aortic atherosclerosis with a borderline aneurysmal aortic root ?and a slightly aneurysmal ascending aorta. Annual CTA or MRA ?follow-up  recommended. ?4. Large, 12 cm right parapelvic cyst or severely dilated right ?renal pelvis such as due to a chronic UPJ stenosis. No intrarenal ?caliectasis is seen either side. Contrast is not seen in either ?collecting system in the delayed phase. ?5. Slight asymmetric delayed cortical function on the right, with ?asymmetric right perinephric stranding and fluid, could be due to a ?recently passed stone or ascending UTI. There is a single tiny ?nonobstructive caliceal stone in the lower pole of this kidney and ?mild asymmetric cortical thinning. ?6. Constipation and diverticulosis. ?7. Mildly prominent mildly steatotic liver. ?8. Degenerative and postsurgical changes of the spine. ?9. Umbilical and inguinal fat hernias. ? ?The patient was given 2 g of IV Rocephin, 25 mg p.o. Antivert, 1 L bolus of IV normal saline for 125 mill per hour and 1 g of p.o. Tylenol.  He will be admitted to a medical telemetry bed for further evaluation and management. ?  ? ?PAST MEDICAL HISTORY:  ? ?Past Medical History:  ?Diagnosis Date  ?? Anemia   ?? Arthritis   ?? Coronary artery disease   ?? Diabetes mellitus without complication ([place])   ?? Diverticulosis   ?? Duodenal ulcer   ?? Erectile dysfunction   ?? GERD (gastroesophageal reflux disease)   ?? Hyperlipidemia   ?? Hypertension   ? ? ?PAST SURGICAL HISTORY:  ? ?Past Surgical History:  ?Procedure Laterality Date  ?? CARDIAC CATHETERIZATION    ?? COLONOSCOPY WITH PROPOFOL N/A 05/31/2017  ? Procedure: COLONOSCOPY WITH PROPOFOL;  Surgeon: Manya Silvas, MD;  Location: [facility] ENDOSCOPY;  Service: Endoscopy;  Laterality: N/A;  ?? JOINT REPLACEMENT Left   ? knee  ?? THORACIC DISCECTOMY N/A 08/09/2019  ? Procedure: T11-12 POSTERIOR FUSION WITH TRANSPEDICULAR DECOMPRESSION;  Surgeon: Meade Maw, MD;  Location: [facility] ORS;  Service: Neurosurgery;  Laterality: N/A;  ?? VASECTOMY    ? ? ?SOCIAL HISTORY:  ? ?Social History  ? ?Tobacco Use  ?? Smoking status: Former  ?  Types:  Cigarettes  ?  Quit date: 11/02/1987  ?  Years since quitting: 34.1  ?? Smokeless tobacco: Never  ?Substance Use Topics  ?? Alcohol use: No  ? ? ?FAMILY HISTORY:  ? Positive for diabetes mellitus, hypertension, coronary artery disease, CVA and cancer. ? ?DRUG ALLERGIES:  ? ?Allergies  ?Allergen Reactions  ?? Actos [Pioglitazone] Hives  ?? Amoxicillin Hives and Rash  ? ? ?REVIEW OF SYSTEMS:  ? ?ROS ?As per history of present illness. All pertinent systems were reviewed above. Constitutional, HEENT, cardiovascular, respiratory, GI, GU, musculoskeletal, neuro, psychiatric, endocrine, integumentary and hematologic systems were reviewed and are otherwise negative/unremarkable except for positive findings mentioned above in the HPI. ? ? ?MEDICATIONS AT HOME:  ? ?Prior to Admission medications   ?Medication Sig Start Date End Date Taking? Authorizing Provider  ?amLODipine (NORVASC) 10 MG tablet Take 10 mg by mouth daily.    [provider]  ?ascorbic acid (VITAMIN C) 1000 MG tablet Take 1,000 mg by mouth daily.    [provider]  ?atorvastatin (LIPITOR) 20 MG tablet Take 20 mg by mouth daily.    [provider]  ?cholecalciferol (VITAMIN D) 25 MCG (1000 UT) tablet Take 1,000 Units by mouth daily.    [provider]  ?CINNAMON PO Take 1,000 mg by mouth daily.     [provider]  ?esomeprazole (NEXIUM) 20 MG capsule Take 20 mg by mouth daily at 12 noon.    [provider]  ?gabapentin (NEURONTIN) 100 MG capsule Take 300 mg by mouth at bedtime. 07/12/19   [provider]  ?glipiZIDE (GLUCOTROL) 5 MG tablet Take 5 mg by mouth daily. 07/17/19   [provider]  ?hydrochlorothiazide (HYDRODIURIL) 25 MG tablet Take 25 mg by mouth daily.    [provider]  ?magnesium oxide (MAG-OX) 400 MG tablet Take 400 mg by mouth daily.    [provider]  ?metFORMIN (GLUCOPHAGE) 1000 MG tablet Take 1,000 mg by mouth 2 (two) times daily with a meal.     [provider]  ?methocarbamol (ROBAXIN) 500 MG tablet Take 1 tablet (500 mg total) by mouth every 6 (six) hours as needed for muscle spasms. 08/11/19   Marin Olp, PA-C  ?omega-3 acid ethyl esters (LOVAZA) 1 g capsule Take 1 capsule by mouth 2 (two) times daily.    [provider]  ?ramipril (ALTACE) 10 MG capsule Take 10 mg by mouth 2 (two) times daily.     [provider]  ?vitamin B-12 (CYANOCOBALAMIN) 1000 MCG tablet Take 1,000 mcg by mouth daily.    [provider]  ? ?  ? ?VITAL SIGNS:  ?Blood pressure 106/62, pulse 68, temperature 99.2 ?F (37.3 ?C), temperature source Oral, resp. rate 18, height '5\' 11"'$  (1.803 m), weight 113.4 kg, SpO2 91 %. ? ?PHYSICAL EXAMINATION:  ?Physical Exam ? ?GENERAL:  76 y.o.-year-old Caucasian male patient lying in the bed with no acute distress.  ?EYES: Pupils equal, round, reactive to light and accommodation. No scleral icterus. Extraocular muscles intact.  ?HEENT: Head atraumatic, normocephalic. Oropharynx and nasopharynx clear.  ?  NECK:  Supple, no jugular venous distention. No thyroid enlargement, no tenderness.  ?LUNGS: Normal breath sounds bilaterally, no wheezing, rales,rhonchi or crepitation. No use of accessory muscles of respiration.  ?CARDIOVASCULAR: Regular rate and rhythm, S1, S2 normal. No murmurs, rubs, or gallops.  ?ABDOMEN: Soft, nondistended, nontender. Bowel sounds present. No organomegaly or mass.  ?EXTREMITIES: No pedal edema, cyanosis, or clubbing.  ?NEUROLOGIC: Cranial nerves II through XII are intact. Muscle strength 5/5 in all extremities. Sensation intact. Gait not checked.  ?PSYCHIATRIC: The patient is alert and oriented x 3.  Normal affect and good eye contact. ?SKIN: Warm with no obvious rash, lesion, or ulcer.  ? ?LABORATORY PANEL:  ? ?CBC ?Recent Labs  ?Lab 12/04/21 ?0215  ?WBC 18.3*  ?HGB 16.0  ?HCT 47.9  ?PLT 261   ? ?------------------------------------------------------------------------------------------------------------------ ? ?Chemistries  ?Recent Labs  ?Lab 12/04/21 ?0215  ?NA 137  ?K 4.2  ?CL 102  ?CO2 26  ?GLUCOSE 205*  ?BUN 27*  ?CREATININE 1.35*  ?CALCIUM 9.1  ?AST 19  ?ALT 21  ?ALKPHOS 85  ?BILITOT 1.2  ? ?--------

## 2021-12-04 NOTE — Progress Notes (Signed)
PHARMACIST - PHYSICIAN COMMUNICATION ? ?CONCERNING:  Enoxaparin (Lovenox) for DVT Prophylaxis  ? ? ?RECOMMENDATION: ?Patient was prescribed enoxaprin '40mg'$  q24 hours for VTE prophylaxis.  ? ?Filed Weights  ? 12/04/21 0134  ?Weight: 113.4 kg (250 lb)  ? ? ?Body mass index is 34.87 kg/m?. ? ?Estimated Creatinine Clearance: 61.5 mL/min (A) (by C-G formula based on SCr of 1.35 mg/dL (H)). ? ? ?Based on [place] patient is candidate for enoxaparin 0.'5mg'$ /kg TBW SQ every 24 hours based on BMI being >30. ? ?DESCRIPTION: ?Pharmacy has adjusted enoxaparin dose per [facility] policy. ? ?Patient is now receiving enoxaparin 0.5 mg/kg every 24 hours  ? ?Renda Rolls, PharmD, MBA ?12/04/2021 ?7:15 AM ? ? ?

## 2021-12-04 NOTE — ED Notes (Signed)
Pt eating lunch tray, family at bedside. No distress or discomfort at this time.  ?

## 2021-12-04 NOTE — Assessment & Plan Note (Addendum)
Covered with sliding scale NovoLog coverage. ?Resume home regimen at discharge - glipizide and Jardiance for now. ?

## 2021-12-04 NOTE — Progress Notes (Signed)
?  Progress Note ? ? ?Patient: Jordan [name] OMB:[id number] DOB: Dec 20, 1945 DOA: 12/04/2021     0 ?DOS: the patient was seen and examined on 12/04/2021 ?  ?Brief hospital course: ?76 year old male with past medical history of type 2 diabetes, hypertension, prior stroke, cancer and CAD with history of MI, GERD, duodenal ulcer, diverticulosis presented to the ER on 12/04/2021 with dizziness and fever/chills, urinary urgency/frequency without dysuria hematuria or flank pain. ? ?Evaluation in the ED consistent with UTI. ?Patient admitted to hospitalist service and started on empiric antibiotics pending cultures. ? ?Same day as admission rounding note. ?Please see full H&P for detailed assessment and plan. ?I agree with Dr. Randel Books plan as outlined in H&P with any changes or additions as outlined below ? ?Assessment and Plan: ? ?-- Med history completed, reviewed. ?-- Resume home amlodipine, Lipitor, Lovaza ?--Hold glipizide and Jardiance, metformin already held ?--Stopped Protonix, patient no longer takes ? ? ?  ? ?Subjective: Patient seen in the ED holding for bed, wife at bedside.  He had just developed onset of chills again shortly before I entered the room.  He otherwise reports feeling about the same.  Not currently having dizziness though.  No other acute events reported. ? ?Physical Exam: ?Vitals:  ? 12/04/21 1300 12/04/21 1315 12/04/21 1330 12/04/21 1345  ?BP: (!) 149/74     ?Pulse: 74 71 73 89  ?Resp: 18 (!) 25 (!) 24 17  ?Temp:      ?TempSrc:      ?SpO2: 93% 95% 96% 95%  ?Weight:      ?Height:      ? ?General exam: awake, alert, no acute distress ?HEENT: moist mucus membranes, hearing grossly normal  ?Respiratory system: normal respiratory effort. ?Cardiovascular system: normal S1/S2, RRR, trace lower extremity edema.   ?Gastrointestinal system: soft, NT, ND, no HSM felt, +bowel sounds. ?Central nervous system: A&O x3. no gross focal neurologic deficits, normal speech ?Extremities: moves all normal tone ?Skin: dry,  intact, normal temperature, normal color, No rashes, lesions or ulcers seen on visualized skin ?Psychiatry: normal mood, congruent affect, judgement and insight appear normal ? ? ?Data Reviewed: ? ?Notable labs: Glucose 205 BUN 27 creatinine 1.35 GFR 55 troponin 30 trend is flat WBC 18.3 ?Single blood culture with Enterobacterales, E. coli  ? ? ?Family Communication: Wife at bedside on rounds ? ?Disposition: ?Status is: Inpatient ?Remains inpatient appropriate because: Severity of illness remaining on IV therapies pending cultures ? ? Planned Discharge Destination: Home ? ? ? ?Time spent: 35 minutes ? ?Author: ?Ezekiel Slocumb, DO ?12/04/2021 2:40 PM ? ?For on call review [email or web address]  ?

## 2021-12-04 NOTE — Assessment & Plan Note (Addendum)
POA, resolved.  ?Suspect due to sepsis, possibly mild vertigo. ?--Trial of as needed meclizine ?-- Caution with ambulation if actively dizzy ?-- Treat infection and adequately hydrate ?

## 2021-12-04 NOTE — ED Triage Notes (Signed)
?  Patient BIB EMS for uncontrolled shaking that started around 2330.  Patient states he woke up and was vigorously shaking and could not stop.  Patient states he sweats a lot but was sweating more than usual.  No pain at this time but has some dizziness.  No cardiac or pulmonary hx per patient.   ?

## 2021-12-04 NOTE — Assessment & Plan Note (Addendum)
Patient met sepsis criteria on admission with  sepsis is manifested by tachycardia, tachypnea, fever and leukocytosis.  Treated per sepsis protocol in the ED ?Treated with empiric Rocephin. ?Cultures grew pan-sensitive E. coli ?--Off IV fluids ?5/5: Sepsis physiology resolved ? ? ?Discharge on Keflex for 7 more days. ? ?

## 2021-12-05 DIAGNOSIS — A4150 Gram-negative sepsis, unspecified: Secondary | ICD-10-CM | POA: Diagnosis not present

## 2021-12-05 DIAGNOSIS — N390 Urinary tract infection, site not specified: Secondary | ICD-10-CM | POA: Diagnosis not present

## 2021-12-05 LAB — BASIC METABOLIC PANEL
Anion gap: 12 (ref 5–15)
BUN: 25 mg/dL — ABNORMAL HIGH (ref 8–23)
CO2: 22 mmol/L (ref 22–32)
Calcium: 8.4 mg/dL — ABNORMAL LOW (ref 8.9–10.3)
Chloride: 104 mmol/L (ref 98–111)
Creatinine, Ser: 1.36 mg/dL — ABNORMAL HIGH (ref 0.61–1.24)
GFR, Estimated: 55 mL/min — ABNORMAL LOW (ref >60)
Glucose, Bld: 107 mg/dL — ABNORMAL HIGH (ref 70–99)
Potassium: 4 mmol/L (ref 3.5–5.1)
Sodium: 138 mmol/L (ref 135–145)

## 2021-12-05 LAB — CBC
HCT: 44.5 % (ref 39.0–52.0)
Hemoglobin: 14.5 g/dL (ref 13.0–17.0)
MCH: 29.4 pg (ref 26.0–34.0)
MCHC: 32.6 g/dL (ref 30.0–36.0)
MCV: 90.3 fL (ref 80.0–100.0)
Platelets: 212 10*3/uL (ref 150–400)
RBC: 4.93 MIL/uL (ref 4.22–5.81)
RDW: 14 % (ref 11.5–15.5)
WBC: 15.6 10*3/uL — ABNORMAL HIGH (ref 4.0–10.5)
nRBC: 0 % (ref 0.0–0.2)

## 2021-12-05 LAB — PROTIME-INR
INR: 1.2 (ref 0.8–1.2)
Prothrombin Time: 15 seconds (ref 11.4–15.2)

## 2021-12-05 LAB — GLUCOSE, CAPILLARY
Glucose-Capillary: 133 mg/dL — ABNORMAL HIGH (ref 70–99)
Glucose-Capillary: 136 mg/dL — ABNORMAL HIGH (ref 70–99)
Glucose-Capillary: 109 mg/dL — ABNORMAL HIGH (ref 70–99)
Glucose-Capillary: 131 mg/dL — ABNORMAL HIGH (ref 70–99)

## 2021-12-05 LAB — CORTISOL-AM, BLOOD: Cortisol - AM: 13.5 ug/dL (ref 6.7–22.6)

## 2021-12-05 LAB — MAGNESIUM: Magnesium: 2.5 mg/dL — ABNORMAL HIGH (ref 1.7–2.4)

## 2021-12-05 LAB — PROCALCITONIN: Procalcitonin: 7.31 ng/mL

## 2021-12-05 NOTE — Plan of Care (Signed)

## 2021-12-05 NOTE — Progress Notes (Signed)
?Progress Note ? ? ?Patient: Jordan [name] VWU:[id number] DOB: 06-29-1946 DOA: 12/04/2021     1 ?DOS: the patient was seen and examined on 12/05/2021 ?  ?Brief hospital course: ?76 year old male with past medical history of type 2 diabetes, hypertension, prior stroke, cancer and CAD with history of MI, GERD, duodenal ulcer, diverticulosis presented to the ER on 12/04/2021 with dizziness and fever/chills, urinary urgency/frequency without dysuria hematuria or flank pain. ? ?Evaluation in the ED consistent with UTI. ?Patient admitted to hospitalist service and started on empiric antibiotics pending cultures. ? ?Urine and admission blood cultures both growing E. coli, susceptibilities are pending ? ?Assessment and Plan: ?* Sepsis due to gram-negative UTI ([place]) ?Patient met sepsis criteria on admission with  sepsis is manifested by tachycardia, tachypnea, fever and leukocytosis.  Treated per sepsis protocol in the ED ?--Continue IV antibiotics as outlined ?--Stop IV fluids ?--Follow cultures ?--Monitor fever curve, CBC and hemodynamics closely ? ?5/5: Sepsis physiology resolved ? ? ? ?AKI (acute kidney injury) ([place]) ?Ruled out.  Patient presented with creatinine of 1.35, only up slightly from baseline of 1.2 a little over a month ago. ?Renal function appears near baseline. ?-- Monitor BMP ? ?Type 2 diabetes mellitus without complications ([place]) ?On a scale NovoLog coverage. ?Hold home glipizide and Jardiance for now. ? ?Dizziness ?Suspect due to sepsis, possibly mild vertigo. ?--Trial of as needed meclizine ?-- Caution with ambulation if actively dizzy ?-- Treat infection and adequately hydrate ? ?Essential hypertension ?Continue Norvasc and Coreg.  Hold HCTZ and ramipril, likely resume tomorrow. ? ?5/5: BP mildly elevated today but was soft within less than 24 hours ago ? ?Dyslipidemia ?Continue statin ? ? ? ? ?  ? ?Subjective: Patient sitting up in recliner when seen today.  He reports overall feeling better.  Denies dizziness.   Reports some mild dyspnea on exertion that is new for him.  No fevers chills.  Overall feeling better no acute events reported. ? ?Physical Exam: ?Vitals:  ? 12/04/21 1509 12/04/21 2010 12/05/21 0420 12/05/21 0802  ?BP: (!) 148/73 (!) 152/70 138/67 (!) 153/72  ?Pulse: 85 85 80 80  ?Resp: _0 ?Temp: 98.6 ?F (37 ?C) 98.4 ?F (36.9 ?C) 98.4 ?F (36.9 ?C) 98.6 ?F (37 ?C)  ?TempSrc: Oral     ?SpO2: 98% 92% 99% 93%  ?Weight:      ?Height:      ? ?General exam: awake, alert, no acute distress ?HEENT: atraumatic, clear conjunctiva, anicteric sclera, moist mucus membranes, hearing grossly normal  ?Respiratory system: CTAB with faint bibasilar crackles, no wheezes, normal respiratory effort at rest, on room air. ?Cardiovascular system: normal S1/S2, RRR, no pedal edema.   ?Gastrointestinal system: soft, NT, ND, no HSM felt, +bowel sounds. ?Central nervous system: A&O x4. no gross focal neurologic deficits, normal speech ?Extremities: moves all, no edema, normal tone ?Skin: dry, intact, normal temperature ?Psychiatry: normal mood, congruent affect, judgement and insight appear normal ? ? ?Data Reviewed: ? ?Notable labs: Glucose 107, BUN 25, creatinine 1.36, calcium 8.4, magnesium 2.5, GFR 55, procalcitonin 7.31 up from 0.39, WBC 15.6 improved ? ?Family Communication: None at bedside.  Wife was at bedside on rounds yesterday 5/4. ? ?Disposition: ?Status is: Inpatient ?Remains inpatient appropriate because: Remains on empiric IV antibiotics pending culture susceptibilities.  Anticipate discharge tomorrow if cultures result patient clinically improved. ? ? Planned Discharge Destination: Home ? ? ? ?Time spent: 35 minutes ? ?Author: ?Ezekiel Slocumb, DO ?12/05/2021 1:30 PM ? ?For on call review [email or web address].  ?

## 2021-12-06 DIAGNOSIS — A4150 Gram-negative sepsis, unspecified: Secondary | ICD-10-CM | POA: Diagnosis not present

## 2021-12-06 DIAGNOSIS — N390 Urinary tract infection, site not specified: Secondary | ICD-10-CM | POA: Diagnosis not present

## 2021-12-06 LAB — CBC
HCT: 47 % (ref 39.0–52.0)
Hemoglobin: 15.4 g/dL (ref 13.0–17.0)
MCH: 29.3 pg (ref 26.0–34.0)
MCHC: 32.8 g/dL (ref 30.0–36.0)
MCV: 89.4 fL (ref 80.0–100.0)
Platelets: 265 10*3/uL (ref 150–400)
RBC: 5.26 MIL/uL (ref 4.22–5.81)
RDW: 13.8 % (ref 11.5–15.5)
WBC: 18.4 10*3/uL — ABNORMAL HIGH (ref 4.0–10.5)
nRBC: 0 % (ref 0.0–0.2)

## 2021-12-06 LAB — URINE CULTURE: Culture: >=100000 — AB

## 2021-12-06 LAB — BASIC METABOLIC PANEL
Anion gap: 14 (ref 5–15)
BUN: 21 mg/dL (ref 8–23)
CO2: 22 mmol/L (ref 22–32)
Calcium: 9.1 mg/dL (ref 8.9–10.3)
Chloride: 102 mmol/L (ref 98–111)
Creatinine, Ser: 1.19 mg/dL (ref 0.61–1.24)
GFR, Estimated: >60 mL/min (ref >60)
Glucose, Bld: 116 mg/dL — ABNORMAL HIGH (ref 70–99)
Potassium: 4.3 mmol/L (ref 3.5–5.1)
Sodium: 138 mmol/L (ref 135–145)

## 2021-12-06 LAB — GLUCOSE, CAPILLARY: Glucose-Capillary: 187 mg/dL — ABNORMAL HIGH (ref 70–99)

## 2021-12-06 MED ORDER — CEPHALEXIN 500 MG PO CAPS
500.0000 mg | ORAL_CAPSULE | Freq: Two times a day (BID) | ORAL | 0 refills | Status: AC
Start: 2021-12-07 — End: 2021-12-14

## 2021-12-06 MED ORDER — HYDROCHLOROTHIAZIDE 25 MG PO TABS
25.0000 mg | ORAL_TABLET | Freq: Every day | ORAL | Status: DC
  Administered 2021-12-06: 25 mg via ORAL
  Filled 2021-12-06: qty 1

## 2021-12-06 MED ORDER — SODIUM CHLORIDE 0.9 % IV SOLN
INTRAVENOUS | Status: DC | PRN
Start: 2021-12-06 — End: 2021-12-06

## 2021-12-06 MED ORDER — CEPHALEXIN 500 MG PO CAPS: 500.0000 mg | ORAL_CAPSULE | Freq: Two times a day (BID) | ORAL | Status: DC

## 2021-12-06 MED ORDER — RAMIPRIL 10 MG PO CAPS
10.0000 mg | ORAL_CAPSULE | Freq: Two times a day (BID) | ORAL | Status: DC
  Administered 2021-12-06: 10 mg via ORAL
  Filled 2021-12-06: qty 1

## 2021-12-06 NOTE — TOC CM/SW Note (Signed)
?  Transition of Care (TOC) Screening Note ? ? ?Patient Details  ?Name: Jordan [name] ?Date of Birth: Feb 25, 1946 ? ? ?Transition of Care (TOC) CM/SW Contact:    ?Katheleen Stella E Fortunata Betty, LCSW ?Phone Number: ?12/06/2021, 10:01 AM ? ? ? ?Transition of Care Department [facility]) has reviewed patient and no TOC needs have been identified at this time. We will continue to monitor patient advancement through interdisciplinary progression rounds. If new patient transition needs arise, please place a TOC consult. ? ? ?

## 2021-12-06 NOTE — Progress Notes (Signed)
Patient medically cleared with discharge orders placed by MD.  Patient received AVS including education regarding changes to medications with patient verbalizing understanding of reviewed material.  Patient's PIV removed prior to discharge and patient discharge by primary RN '@1215'$ . ?

## 2021-12-06 NOTE — Discharge Summary (Signed)
?Physician Discharge Summary ?  ?Patient: Jordan [name] MRN: [id number] DOB: 05/05/1946  ?Admit date:     12/04/2021  ?Discharge date: 12/08/21  ?Discharge Physician: Ezekiel Slocumb  ? ?PCP: Sofie Hartigan, MD  ? ?Recommendations at discharge:  ? ?Follow-up with primary care in 1 to 2 weeks ?Repeat CBC, BMP in 1-2 ? ?Discharge Diagnoses: ?Principal Problem: ?  Sepsis due to gram-negative UTI ([place]) ?Active Problems: ?  AKI (acute kidney injury) ([place]) ?  Type 2 diabetes mellitus without complications ([place]) ?  Essential hypertension ?  Dizziness ?  Dyslipidemia ? ? ?Hospital Course: ?76 year old male with past medical history of type 2 diabetes, hypertension, prior stroke, cancer and CAD with history of MI, GERD, duodenal ulcer, diverticulosis presented to the ER on 12/04/2021 with dizziness and fever/chills, urinary urgency/frequency without dysuria hematuria or flank pain. ? ?Evaluation in the ED consistent with UTI. ?Patient admitted to hospitalist service and started on empiric antibiotics pending cultures. ? ?Urine and admission blood cultures both grew E. coli, pan-sensitive. ? ?5/6 - pt clinically improved and medically stable for discharge on PO antibiotics to complete course of therapy. ? ?Assessment and Plan: ?* Sepsis due to gram-negative UTI ([place]) ?Patient met sepsis criteria on admission with  sepsis is manifested by tachycardia, tachypnea, fever and leukocytosis.  Treated per sepsis protocol in the ED ?Treated with empiric Rocephin. ?Cultures grew pan-sensitive E. coli ?--Off IV fluids ?5/5: Sepsis physiology resolved ? ? ?Discharge on Keflex for 7 more days. ? ? ?AKI (acute kidney injury) ([place]) ?Ruled out.  Patient presented with creatinine of 1.35, only up slightly from baseline of 1.2 a little over a month ago. ?Renal function appears near baseline. ?-- Monitor BMP ? ?Type 2 diabetes mellitus without complications ([place]) ?Covered with sliding scale NovoLog coverage. ?Resume home regimen at discharge -  glipizide and Jardiance for now. ? ?Dizziness ?POA, resolved.  ?Suspect due to sepsis, possibly mild vertigo. ?--Trial of as needed meclizine ?-- Caution with ambulation if actively dizzy ?-- Treat infection and adequately hydrate ? ?Essential hypertension ?Continue Norvasc and Coreg.   ?HCTZ and ramipril were initially held, resumed.  ?PCP follow up. ? ? ?Dyslipidemia ?Continue statin ? ? ? ? ?  ? ? ?Consultants: none ?Procedures performed: none  ?Disposition: Home ?Diet recommendation:  ?Cardiac and Carb modified diet ?DISCHARGE MEDICATION: ?Allergies as of 12/06/2021   ? ?   Reactions  ? Actos [pioglitazone] Hives  ? Amoxicillin Hives, Rash  ? ?  ? ?  ?Medication List  ?  ? ?STOP taking these medications   ? ?esomeprazole 20 MG capsule ?Commonly known as: [place] ?  ?methocarbamol 500 MG tablet ?Commonly known as: Robaxin ?  ? ?  ? ?TAKE these medications   ? ?amLODipine 10 MG tablet ?Commonly known as: NORVASC ?Take 10 mg by mouth daily. ?  ?ascorbic acid 1000 MG tablet ?Commonly known as: VITAMIN C ?Take 1,000 mg by mouth daily. ?  ?atorvastatin 20 MG tablet ?Commonly known as: LIPITOR ?Take 20 mg by mouth daily. ?  ?carvedilol 3.125 MG tablet ?Commonly known as: COREG ?Take 3.125 mg by mouth 2 (two) times daily. ?  ?cephALEXin 500 MG capsule ?Commonly known as: KEFLEX ?Take 1 capsule (500 mg total) by mouth every 12 (twelve) hours for 7 days. ?  ?cholecalciferol 25 MCG (1000 UNIT) tablet ?Commonly known as: VITAMIN D ?Take 1,000 Units by mouth daily. ?  ?CINNAMON PO ?Take 1,000 mg by mouth daily. ?  ?gabapentin 100 MG capsule ?Commonly known  as: NEURONTIN ?Take 300 mg by mouth at bedtime. ?  ?glipiZIDE 5 MG tablet ?Commonly known as: GLUCOTROL ?Take 5 mg by mouth daily. ?  ?hydrochlorothiazide 25 MG tablet ?Commonly known as: HYDRODIURIL ?Take 25 mg by mouth daily. ?  ?Jardiance 25 MG Tabs tablet ?Generic drug: empagliflozin ?Take 25 mg by mouth daily. ?  ?magnesium oxide 400 MG tablet ?Commonly known as:  MAG-OX ?Take 400 mg by mouth daily. ?  ?metFORMIN 1000 MG tablet ?Commonly known as: GLUCOPHAGE ?Take 1,000 mg by mouth 2 (two) times daily with a meal. ?  ?omega-3 acid ethyl esters 1 g capsule ?Commonly known as: LOVAZA ?Take 1 capsule by mouth 2 (two) times daily. ?  ?ramipril 10 MG capsule ?Commonly known as: ALTACE ?Take 10 mg by mouth 2 (two) times daily. ?  ?vitamin B-12 1000 MCG tablet ?Commonly known as: CYANOCOBALAMIN ?Take 1,000 mcg by mouth daily. ?  ? ?  ? ? ?Discharge Exam: ?Danley Danker Weights  ? 12/04/21 0134  ?Weight: 113.4 kg  ? ?General exam: awake, alert, no acute distress ?HEENT: oist mucus membranes, hearing grossly normal  ?Respiratory system: normal respiratory effort. ?Cardiovascular system: RRR, no pedal edema.   ?Central nervous system: A&O x4. no gross focal neurologic deficits, normal speech ?Extremities: moves all, no edema, normal tone ?Skin: dry, intact, normal temperature ?Psychiatry: normal mood, congruent affect, judgement and insight appear normal ? ? ?Condition at discharge: stable ? ?The results of significant diagnostics from this hospitalization (including imaging, microbiology, ancillary and laboratory) are listed below for reference.  ? ?Imaging Studies: ?MR BRAIN WO CONTRAST ? ?Result Date: 12/04/2021 ?CLINICAL DATA:  Dizziness EXAM: MRI HEAD WITHOUT CONTRAST TECHNIQUE: Multiplanar, multiecho pulse sequences of the brain and surrounding structures were obtained without intravenous contrast. COMPARISON:  None Available. FINDINGS: Brain: No acute infarct, mass effect or extra-axial collection. No acute or chronic hemorrhage. There is multifocal hyperintense T2-weighted signal within the white matter. Parenchymal volume and CSF spaces are normal. The midline structures are normal. Vascular: Major flow voids are preserved. Skull and upper cervical spine: Normal calvarium and skull base. Visualized upper cervical spine and soft tissues are normal. Sinuses/Orbits:No paranasal sinus fluid  levels or advanced mucosal thickening. No mastoid or middle ear effusion. Normal orbits. IMPRESSION: 1. No acute intracranial abnormality. 2. Findings of chronic small vessel ischemia. Electronically Signed   By: Ulyses Jarred M.D.   On: 12/04/2021 03:21  ? ?CT CHEST ABDOMEN PELVIS W CONTRAST ? ?Result Date: 12/04/2021 ?CLINICAL DATA:  Encounter for sepsis. EXAM: CT CHEST, ABDOMEN, AND PELVIS WITH CONTRAST TECHNIQUE: Multidetector CT imaging of the chest, abdomen and pelvis was performed following the standard protocol during bolus administration of intravenous contrast. RADIATION DOSE REDUCTION: This exam was performed according to the departmental dose-optimization program which includes automated exposure control, adjustment of the mA and/or kV according to patient size and/or use of iterative reconstruction technique. CONTRAST:  136m OMNIPAQUE IOHEXOL 300 MG/ML  SOLN COMPARISON:  Portable chest earlier today is the only relevant prior. FINDINGS: CT CHEST FINDINGS Cardiovascular: The heart is upper-normal in size. There is no pericardial effusion. There is patchy three-vessel calcific CAD, calcification in the mitral ring, calcification and thickening of the aortic valve. The pulmonary arteries are normal caliber and centrally clear. 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  The pulmonary veins are decompressed. Mediastinum/Nodes: No enlarged mediastinal, hilar, or axillary lymph nodes. Thyroid gland, trachea, and esophagus demonstrate no significant findings. Lungs/Pleura: No lung nodule or infiltrate is seen. There is eventration and mild elevation of the right hemidiaphragm. There is no pleural effusion, thickening or pneumothorax. Central airways are clear. There  are calcified granulomas in the posterior extreme right lower lobe base. Musculoskeletal: There is posterior fusion hardware with rods and pedicle screws and intervening laminectomy defect T11-12. The disc sp

## 2021-12-07 LAB — CULTURE, BLOOD (ROUTINE X 2)
Special Requests: ADEQUATE
Special Requests: ADEQUATE

## 2021-12-12 DIAGNOSIS — E1159 Type 2 diabetes mellitus with other circulatory complications: Secondary | ICD-10-CM | POA: Diagnosis not present

## 2021-12-12 DIAGNOSIS — N390 Urinary tract infection, site not specified: Secondary | ICD-10-CM | POA: Diagnosis not present

## 2021-12-12 DIAGNOSIS — I152 Hypertension secondary to endocrine disorders: Secondary | ICD-10-CM | POA: Diagnosis not present

## 2021-12-12 DIAGNOSIS — A419 Sepsis, unspecified organism: Secondary | ICD-10-CM | POA: Diagnosis not present

## 2021-12-12 DIAGNOSIS — N179 Acute kidney failure, unspecified: Secondary | ICD-10-CM | POA: Diagnosis not present

## 2021-12-12 DIAGNOSIS — E1129 Type 2 diabetes mellitus with other diabetic kidney complication: Secondary | ICD-10-CM | POA: Diagnosis not present

## 2021-12-12 DIAGNOSIS — E1169 Type 2 diabetes mellitus with other specified complication: Secondary | ICD-10-CM | POA: Diagnosis not present

## 2021-12-12 DIAGNOSIS — E785 Hyperlipidemia, unspecified: Secondary | ICD-10-CM | POA: Diagnosis not present

## 2021-12-12 DIAGNOSIS — I10 Essential (primary) hypertension: Secondary | ICD-10-CM | POA: Diagnosis not present

## 2021-12-12 DIAGNOSIS — R809 Proteinuria, unspecified: Secondary | ICD-10-CM | POA: Diagnosis not present

## 2021-12-12 DIAGNOSIS — E1140 Type 2 diabetes mellitus with diabetic neuropathy, unspecified: Secondary | ICD-10-CM | POA: Diagnosis not present

## 2022-01-16 DIAGNOSIS — D72829 Elevated white blood cell count, unspecified: Secondary | ICD-10-CM | POA: Diagnosis not present

## 2022-01-20 DIAGNOSIS — Z9889 Other specified postprocedural states: Secondary | ICD-10-CM | POA: Diagnosis not present

## 2022-01-20 DIAGNOSIS — E1169 Type 2 diabetes mellitus with other specified complication: Secondary | ICD-10-CM | POA: Diagnosis not present

## 2022-01-20 DIAGNOSIS — I2510 Atherosclerotic heart disease of native coronary artery without angina pectoris: Secondary | ICD-10-CM | POA: Diagnosis not present

## 2022-01-20 DIAGNOSIS — E785 Hyperlipidemia, unspecified: Secondary | ICD-10-CM | POA: Diagnosis not present

## 2022-01-20 DIAGNOSIS — R0602 Shortness of breath: Secondary | ICD-10-CM | POA: Diagnosis not present

## 2022-02-17 DIAGNOSIS — D72829 Elevated white blood cell count, unspecified: Secondary | ICD-10-CM | POA: Diagnosis not present

## 2022-04-20 DIAGNOSIS — E1159 Type 2 diabetes mellitus with other circulatory complications: Secondary | ICD-10-CM | POA: Diagnosis not present

## 2022-04-20 DIAGNOSIS — R809 Proteinuria, unspecified: Secondary | ICD-10-CM | POA: Diagnosis not present

## 2022-04-20 DIAGNOSIS — I152 Hypertension secondary to endocrine disorders: Secondary | ICD-10-CM | POA: Diagnosis not present

## 2022-04-20 DIAGNOSIS — E1129 Type 2 diabetes mellitus with other diabetic kidney complication: Secondary | ICD-10-CM | POA: Diagnosis not present

## 2022-04-20 DIAGNOSIS — E785 Hyperlipidemia, unspecified: Secondary | ICD-10-CM | POA: Diagnosis not present

## 2022-04-20 DIAGNOSIS — E1169 Type 2 diabetes mellitus with other specified complication: Secondary | ICD-10-CM | POA: Diagnosis not present

## 2022-04-23 DIAGNOSIS — E1140 Type 2 diabetes mellitus with diabetic neuropathy, unspecified: Secondary | ICD-10-CM | POA: Diagnosis not present

## 2022-04-23 DIAGNOSIS — E1165 Type 2 diabetes mellitus with hyperglycemia: Secondary | ICD-10-CM | POA: Diagnosis not present

## 2022-04-23 DIAGNOSIS — E7800 Pure hypercholesterolemia, unspecified: Secondary | ICD-10-CM | POA: Diagnosis not present

## 2022-04-30 DIAGNOSIS — G4733 Obstructive sleep apnea (adult) (pediatric): Secondary | ICD-10-CM | POA: Diagnosis not present

## 2022-04-30 DIAGNOSIS — I10 Essential (primary) hypertension: Secondary | ICD-10-CM | POA: Diagnosis not present

## 2022-04-30 DIAGNOSIS — K219 Gastro-esophageal reflux disease without esophagitis: Secondary | ICD-10-CM | POA: Diagnosis not present

## 2022-04-30 DIAGNOSIS — E1140 Type 2 diabetes mellitus with diabetic neuropathy, unspecified: Secondary | ICD-10-CM | POA: Diagnosis not present

## 2022-04-30 DIAGNOSIS — E7800 Pure hypercholesterolemia, unspecified: Secondary | ICD-10-CM | POA: Diagnosis not present

## 2022-08-24 DIAGNOSIS — E1129 Type 2 diabetes mellitus with other diabetic kidney complication: Secondary | ICD-10-CM | POA: Diagnosis not present

## 2022-08-24 DIAGNOSIS — I152 Hypertension secondary to endocrine disorders: Secondary | ICD-10-CM | POA: Diagnosis not present

## 2022-08-24 DIAGNOSIS — E1169 Type 2 diabetes mellitus with other specified complication: Secondary | ICD-10-CM | POA: Diagnosis not present

## 2022-08-24 DIAGNOSIS — R809 Proteinuria, unspecified: Secondary | ICD-10-CM | POA: Diagnosis not present

## 2022-08-24 DIAGNOSIS — E785 Hyperlipidemia, unspecified: Secondary | ICD-10-CM | POA: Diagnosis not present

## 2022-08-24 DIAGNOSIS — E1159 Type 2 diabetes mellitus with other circulatory complications: Secondary | ICD-10-CM | POA: Diagnosis not present

## 2022-09-04 DIAGNOSIS — H2513 Age-related nuclear cataract, bilateral: Secondary | ICD-10-CM | POA: Diagnosis not present

## 2022-09-04 DIAGNOSIS — E119 Type 2 diabetes mellitus without complications: Secondary | ICD-10-CM | POA: Diagnosis not present

## 2022-10-06 DIAGNOSIS — E785 Hyperlipidemia, unspecified: Secondary | ICD-10-CM | POA: Diagnosis not present

## 2022-10-06 DIAGNOSIS — Z23 Encounter for immunization: Secondary | ICD-10-CM | POA: Diagnosis not present

## 2022-10-06 DIAGNOSIS — I152 Hypertension secondary to endocrine disorders: Secondary | ICD-10-CM | POA: Diagnosis not present

## 2022-10-06 DIAGNOSIS — R0602 Shortness of breath: Secondary | ICD-10-CM | POA: Diagnosis not present

## 2022-10-06 DIAGNOSIS — Z9889 Other specified postprocedural states: Secondary | ICD-10-CM | POA: Diagnosis not present

## 2022-10-06 DIAGNOSIS — E1159 Type 2 diabetes mellitus with other circulatory complications: Secondary | ICD-10-CM | POA: Diagnosis not present

## 2022-10-06 DIAGNOSIS — I2510 Atherosclerotic heart disease of native coronary artery without angina pectoris: Secondary | ICD-10-CM | POA: Diagnosis not present

## 2022-10-06 DIAGNOSIS — E1169 Type 2 diabetes mellitus with other specified complication: Secondary | ICD-10-CM | POA: Diagnosis not present

## 2022-10-28 DIAGNOSIS — E7800 Pure hypercholesterolemia, unspecified: Secondary | ICD-10-CM | POA: Diagnosis not present

## 2022-11-04 DIAGNOSIS — Z6835 Body mass index (BMI) 35.0-35.9, adult: Secondary | ICD-10-CM | POA: Diagnosis not present

## 2022-11-04 DIAGNOSIS — I10 Essential (primary) hypertension: Secondary | ICD-10-CM | POA: Diagnosis not present

## 2022-11-04 DIAGNOSIS — E7800 Pure hypercholesterolemia, unspecified: Secondary | ICD-10-CM | POA: Diagnosis not present

## 2022-11-04 DIAGNOSIS — K219 Gastro-esophageal reflux disease without esophagitis: Secondary | ICD-10-CM | POA: Diagnosis not present

## 2022-11-04 DIAGNOSIS — G4733 Obstructive sleep apnea (adult) (pediatric): Secondary | ICD-10-CM | POA: Diagnosis not present

## 2022-11-04 DIAGNOSIS — Z0000 Encounter for general adult medical examination without abnormal findings: Secondary | ICD-10-CM | POA: Diagnosis not present

## 2022-11-04 DIAGNOSIS — E1140 Type 2 diabetes mellitus with diabetic neuropathy, unspecified: Secondary | ICD-10-CM | POA: Diagnosis not present

## 2022-11-04 DIAGNOSIS — E1165 Type 2 diabetes mellitus with hyperglycemia: Secondary | ICD-10-CM | POA: Diagnosis not present

## 2023-01-22 DIAGNOSIS — R809 Proteinuria, unspecified: Secondary | ICD-10-CM | POA: Diagnosis not present

## 2023-01-22 DIAGNOSIS — E785 Hyperlipidemia, unspecified: Secondary | ICD-10-CM | POA: Diagnosis not present

## 2023-01-22 DIAGNOSIS — E1159 Type 2 diabetes mellitus with other circulatory complications: Secondary | ICD-10-CM | POA: Diagnosis not present

## 2023-01-22 DIAGNOSIS — I152 Hypertension secondary to endocrine disorders: Secondary | ICD-10-CM | POA: Diagnosis not present

## 2023-01-22 DIAGNOSIS — E1169 Type 2 diabetes mellitus with other specified complication: Secondary | ICD-10-CM | POA: Diagnosis not present

## 2023-01-22 DIAGNOSIS — E1129 Type 2 diabetes mellitus with other diabetic kidney complication: Secondary | ICD-10-CM | POA: Diagnosis not present

## 2023-02-18 DIAGNOSIS — M48062 Spinal stenosis, lumbar region with neurogenic claudication: Secondary | ICD-10-CM | POA: Diagnosis not present

## 2023-02-18 DIAGNOSIS — G8929 Other chronic pain: Secondary | ICD-10-CM | POA: Diagnosis not present

## 2023-02-18 DIAGNOSIS — M5441 Lumbago with sciatica, right side: Secondary | ICD-10-CM | POA: Diagnosis not present

## 2023-02-18 DIAGNOSIS — M5136 Other intervertebral disc degeneration, lumbar region: Secondary | ICD-10-CM | POA: Diagnosis not present

## 2023-04-08 DIAGNOSIS — E785 Hyperlipidemia, unspecified: Secondary | ICD-10-CM | POA: Diagnosis not present

## 2023-04-08 DIAGNOSIS — Z23 Encounter for immunization: Secondary | ICD-10-CM | POA: Diagnosis not present

## 2023-04-08 DIAGNOSIS — E1129 Type 2 diabetes mellitus with other diabetic kidney complication: Secondary | ICD-10-CM | POA: Diagnosis not present

## 2023-04-08 DIAGNOSIS — R809 Proteinuria, unspecified: Secondary | ICD-10-CM | POA: Diagnosis not present

## 2023-04-08 DIAGNOSIS — Z9889 Other specified postprocedural states: Secondary | ICD-10-CM | POA: Diagnosis not present

## 2023-04-08 DIAGNOSIS — I2510 Atherosclerotic heart disease of native coronary artery without angina pectoris: Secondary | ICD-10-CM | POA: Diagnosis not present

## 2023-04-08 DIAGNOSIS — R0602 Shortness of breath: Secondary | ICD-10-CM | POA: Diagnosis not present

## 2023-04-16 NOTE — Progress Notes (Unsigned)
Referring Physician:  Marina Goodell, MD [address] [place],  [place] [address]  Primary Physician:  Marina Goodell, MD  History of Present Illness: 04/20/2023 Jordan [name] has a history of CAD, cardiac cath, hyperlipidemia, DM, HTN, history of duodenal ulcer, GERD.   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   Last seen by Dr. Myer Haff on 05/09/20 and he was doing well.   1 year history of constant LBP with bilateral anterior leg pain to his feet. His legs feel tired and heavy- this is worse with walking.  He has numbness in both feet. Pain is worse with walking and prolonged sitting. Pain is more of a soreness/numbness. He does better walking with shopping cart. He notes some weakness in his legs.   Bowel/Bladder Dysfunction: none  Conservative measures:  Physical therapy: no recent Multimodal medical therapy including regular antiinflammatories: zanaflex  Injections: No recent epidural steroid injections  Past Surgery:  T11-T12 posterior fusion on 08/09/19 by Dr. Wonda Amis has no symptoms of cervical myelopathy. He has some balance issues that started when he feet became numb.   The symptoms are causing a significant impact on the patient's life.   Review of Systems:  A 10 point review of systems is negative, except for the pertinent positives and negatives detailed in the HPI.  Past Medical History: Past Medical History:  Diagnosis Date   Anemia    Arthritis    Coronary artery disease    Diabetes mellitus without complication (HCC)    Diverticulosis    Duodenal ulcer    Erectile dysfunction    GERD (gastroesophageal reflux disease)    Hyperlipidemia    Hypertension     Past Surgical History: Past Surgical History:  Procedure Laterality Date   CARDIAC CATHETERIZATION     COLONOSCOPY WITH PROPOFOL N/A 05/31/2017   Procedure: COLONOSCOPY WITH PROPOFOL;  Surgeon: Scot Jun, MD;   Location: [facility] ENDOSCOPY;  Service: Endoscopy;  Laterality: N/A;   JOINT REPLACEMENT Left    knee   THORACIC DISCECTOMY N/A 08/09/2019   Procedure: T11-12 POSTERIOR FUSION WITH TRANSPEDICULAR DECOMPRESSION;  Surgeon: Venetia Night, MD;  Location: [facility] ORS;  Service: Neurosurgery;  Laterality: N/A;   VASECTOMY      Allergies: Allergies as of 04/20/2023 - Review Complete 12/04/2021  Allergen Reaction Noted   Actos [pioglitazone] Hives 05/28/2017   Amoxicillin Hives and Rash 05/28/2017    Medications: Outpatient Encounter Medications as of 04/20/2023  Medication Sig   amLODipine (NORVASC) 10 MG tablet Take 10 mg by mouth daily.   ascorbic acid (VITAMIN C) 1000 MG tablet Take 1,000 mg by mouth daily.   atorvastatin (LIPITOR) 20 MG tablet Take 20 mg by mouth daily.   carvedilol (COREG) 3.125 MG tablet Take 3.125 mg by mouth 2 (two) times daily.   cholecalciferol (VITAMIN D) 25 MCG (1000 UT) tablet Take 1,000 Units by mouth daily.   CINNAMON PO Take 1,000 mg by mouth daily.    gabapentin (NEURONTIN) 100 MG capsule Take 300 mg by mouth at bedtime.   glipiZIDE (GLUCOTROL) 5 MG tablet Take 5 mg by mouth daily.   hydrochlorothiazide (HYDRODIURIL) 25 MG tablet Take 25 mg by mouth daily.   JARDIANCE 25 MG TABS tablet Take 25 mg by mouth daily.   magnesium oxide (MAG-OX) 400 MG tablet Take 400 mg by mouth daily.   metFORMIN (GLUCOPHAGE) 1000 MG tablet Take 1,000 mg by mouth 2 (two) times  daily with a meal.   omega-3 acid ethyl esters (LOVAZA) 1 g capsule Take 1 capsule by mouth 2 (two) times daily.   ramipril (ALTACE) 10 MG capsule Take 10 mg by mouth 2 (two) times daily.    vitamin B-12 (CYANOCOBALAMIN) 1000 MCG tablet Take 1,000 mcg by mouth daily.   No facility-administered encounter medications on file as of 04/20/2023.    Social History: Social History   Tobacco Use   Smoking status: Former    Current packs/day: 0.00    Types: Cigarettes    Quit date: 11/02/1987    Years since  quitting: 35.4   Smokeless tobacco: Never  Vaping Use   Vaping status: Never Used  Substance Use Topics   Alcohol use: No   Drug use: No    Family Medical History: No family history on file.  Physical Examination: There were no vitals filed for this visit.  General: Patient is well developed, well nourished, calm, collected, and in no apparent distress. Attention to examination is appropriate.  Respiratory: Patient is breathing without any difficulty.   NEUROLOGICAL:     Awake, alert, oriented to person, place, and time.  Speech is clear and fluent. Fund of knowledge is appropriate.   Cranial Nerves: Pupils equal round and reactive to light.  Facial tone is symmetric.    Well healed thoracic incision with no tenderness.   No lower lumbar tenderness.   No abnormal lesions on exposed skin.   Strength: Side Biceps Triceps Deltoid Interossei Grip Wrist Ext. Wrist Flex.  R 5 5 5 5 5 5 5   L 5 5 5 5 5 5 5    Side Iliopsoas Quads Hamstring PF DF EHL  R 5 5 5 5 5 5   L 5 5 5 5 5 5    Reflexes are 2+ and symmetric at the biceps, brachioradialis, patella and achilles.   Hoffman's is absent.  Clonus is not present.   Bilateral upper and lower extremity sensation is intact to light touch.     He walks with cane. Wide based gait.   Medical Decision Making  Imaging: Lumbar xrays dated 02/18/23:  Xrays revealed lumbar spine  with severe lumbar degenerative arthrosis throughout the lumbar spine. Previous T11-T12 lumbar fusion is noted with the screws in place.  There is significant T12-L1, L4-L5, L5-S1 degenerative disc space collapse. There is significant osteophyte formation throughout.  There is normal curvature.  The pelvis intact with no hip joint degenerative changes seen. There is no signs of vertebral compression fracture.     I have personally reviewed the images and agree with the above interpretation.  Assessment and Plan: Jordan [name] is a pleasant 77 y.o. male who has a  history of T11-T12 posterior fusion on 08/09/19 by Dr. Myer Haff. He did well postop.   Now with 1 year history of constant LBP with bilateral anterior leg pain to his feet. His legs feel tired and heavy- this is worse with walking. He also has numbness in both feet.   Fusion at T11-T12 looks good on xray. He has known severe lumbar spondylosis and DDD. Symptoms are suspicious for spinal stenosis.   Treatment options discussed with patient and following plan made:   - MRI of lumbar spine to further evaluate for spinal stenosis. No improvement with time or medications (neurontin, aleve, naproxen).  - His balance issues are likely from numbness in feet. No other signs of myelopathy on exam. May consider updated thoracic MRI at some point. May also consider EMG/NCS  as well.  - Prescription for mobic. Reviewed dosing and side effects. Take with food. Stop OTC aleve and advil.   - Will schedule follow up visit to review MRI results once I get them back.   I spent a total of 30 minutes in face-to-face and non-face-to-face activities related to this patient's care today including review of outside records, review of imaging, review of symptoms, physical exam, discussion of differential diagnosis, discussion of treatment options, and documentation.   Thank you for involving me in the care of this patient.   Drake Leach PA-C Dept. of Neurosurgery

## 2023-04-20 ENCOUNTER — Ambulatory Visit
Admission: RE | Admit: 2023-04-20 | Discharge: 2023-04-20 | Disposition: A | Discharge status: Home / Self Care | Payer: Self-pay | Source: Ambulatory Visit | Attending: Orthopedic Surgery | Admitting: Orthopedic Surgery

## 2023-04-20 ENCOUNTER — Ambulatory Visit: Payer: Medicare HMO | Admitting: Orthopedic Surgery

## 2023-04-20 ENCOUNTER — Other Ambulatory Visit: Payer: Self-pay

## 2023-04-20 ENCOUNTER — Encounter: Payer: Self-pay | Admitting: Orthopedic Surgery

## 2023-04-20 VITALS — BP 140/78 | Ht 71.0 in | Wt 249.6 lb

## 2023-04-20 DIAGNOSIS — M4726 Other spondylosis with radiculopathy, lumbar region: Secondary | ICD-10-CM | POA: Diagnosis not present

## 2023-04-20 DIAGNOSIS — Z981 Arthrodesis status: Secondary | ICD-10-CM | POA: Diagnosis not present

## 2023-04-20 DIAGNOSIS — M5136 Other intervertebral disc degeneration, lumbar region: Secondary | ICD-10-CM | POA: Diagnosis not present

## 2023-04-20 DIAGNOSIS — Z049 Encounter for examination and observation for unspecified reason: Secondary | ICD-10-CM

## 2023-04-20 DIAGNOSIS — M47816 Spondylosis without myelopathy or radiculopathy, lumbar region: Secondary | ICD-10-CM

## 2023-04-20 DIAGNOSIS — M5416 Radiculopathy, lumbar region: Secondary | ICD-10-CM

## 2023-04-20 MED ORDER — MELOXICAM 15 MG PO TABS
15.0000 mg | ORAL_TABLET | Freq: Every day | ORAL | 1 refills | Status: DC
Start: 2023-04-20 — End: 2023-06-10

## 2023-04-20 NOTE — Patient Instructions (Signed)
It was so nice to see you today. Thank you so much for coming in.    You have a lot of wear and tear (arthritis) in your lower back.   I want to get an MRI of your lower back to look into things further. We will get this approved through your insurance and [facility] [place] [facility] call you to schedule the appointment.   After you have the MRI, it takes 10-14  days for me to get the results back. Once I have them, we will call you to schedule a follow up visit with me to review them.   Please do not hesitate to call if you have any questions or concerns. You can also message me in MyChart.   Drake Leach PA-C [phone]

## 2023-04-22 ENCOUNTER — Ambulatory Visit
Admission: RE | Admit: 2023-04-22 | Discharge: 2023-04-22 | Disposition: A | Discharge status: Home / Self Care | Payer: Medicare HMO | Source: Ambulatory Visit | Attending: Orthopedic Surgery | Admitting: Orthopedic Surgery

## 2023-04-22 DIAGNOSIS — M47816 Spondylosis without myelopathy or radiculopathy, lumbar region: Secondary | ICD-10-CM | POA: Insufficient documentation

## 2023-04-22 DIAGNOSIS — M5136 Other intervertebral disc degeneration, lumbar region: Secondary | ICD-10-CM | POA: Insufficient documentation

## 2023-04-22 DIAGNOSIS — Z981 Arthrodesis status: Secondary | ICD-10-CM | POA: Insufficient documentation

## 2023-04-22 DIAGNOSIS — M5137 Other intervertebral disc degeneration, lumbosacral region: Secondary | ICD-10-CM | POA: Diagnosis not present

## 2023-04-22 DIAGNOSIS — M5416 Radiculopathy, lumbar region: Secondary | ICD-10-CM | POA: Insufficient documentation

## 2023-04-22 DIAGNOSIS — M5126 Other intervertebral disc displacement, lumbar region: Secondary | ICD-10-CM | POA: Diagnosis not present

## 2023-04-30 DIAGNOSIS — E7800 Pure hypercholesterolemia, unspecified: Secondary | ICD-10-CM | POA: Diagnosis not present

## 2023-04-30 DIAGNOSIS — E1140 Type 2 diabetes mellitus with diabetic neuropathy, unspecified: Secondary | ICD-10-CM | POA: Diagnosis not present

## 2023-04-30 DIAGNOSIS — E1165 Type 2 diabetes mellitus with hyperglycemia: Secondary | ICD-10-CM | POA: Diagnosis not present

## 2023-05-07 ENCOUNTER — Ambulatory Visit
Admission: RE | Admit: 2023-05-07 | Discharge: 2023-05-07 | Disposition: A | Discharge status: Home / Self Care | Payer: Medicare HMO | Source: Ambulatory Visit | Attending: Family Medicine | Admitting: Family Medicine

## 2023-05-07 ENCOUNTER — Other Ambulatory Visit: Payer: Self-pay | Admitting: Family Medicine

## 2023-05-07 DIAGNOSIS — Z23 Encounter for immunization: Secondary | ICD-10-CM | POA: Diagnosis not present

## 2023-05-07 DIAGNOSIS — K429 Umbilical hernia without obstruction or gangrene: Secondary | ICD-10-CM | POA: Diagnosis not present

## 2023-05-07 DIAGNOSIS — I499 Cardiac arrhythmia, unspecified: Secondary | ICD-10-CM | POA: Diagnosis not present

## 2023-05-07 DIAGNOSIS — K269 Duodenal ulcer, unspecified as acute or chronic, without hemorrhage or perforation: Secondary | ICD-10-CM | POA: Diagnosis not present

## 2023-05-07 DIAGNOSIS — R1010 Upper abdominal pain, unspecified: Secondary | ICD-10-CM | POA: Diagnosis not present

## 2023-05-07 DIAGNOSIS — K5730 Diverticulosis of large intestine without perforation or abscess without bleeding: Secondary | ICD-10-CM | POA: Diagnosis not present

## 2023-05-07 DIAGNOSIS — E1140 Type 2 diabetes mellitus with diabetic neuropathy, unspecified: Secondary | ICD-10-CM | POA: Diagnosis not present

## 2023-05-07 DIAGNOSIS — E7800 Pure hypercholesterolemia, unspecified: Secondary | ICD-10-CM | POA: Diagnosis not present

## 2023-05-07 DIAGNOSIS — K219 Gastro-esophageal reflux disease without esophagitis: Secondary | ICD-10-CM | POA: Diagnosis not present

## 2023-05-07 DIAGNOSIS — R748 Abnormal levels of other serum enzymes: Secondary | ICD-10-CM | POA: Diagnosis not present

## 2023-05-07 DIAGNOSIS — I10 Essential (primary) hypertension: Secondary | ICD-10-CM | POA: Diagnosis not present

## 2023-05-07 DIAGNOSIS — G4733 Obstructive sleep apnea (adult) (pediatric): Secondary | ICD-10-CM | POA: Diagnosis not present

## 2023-05-07 DIAGNOSIS — C787 Secondary malignant neoplasm of liver and intrahepatic bile duct: Secondary | ICD-10-CM | POA: Diagnosis not present

## 2023-05-07 MED ORDER — IOHEXOL 300 MG/ML  SOLN
100.0000 mL | Freq: Once | INTRAMUSCULAR | Status: AC | PRN
  Administered 2023-05-07: 100 mL via INTRAVENOUS

## 2023-05-10 DIAGNOSIS — R748 Abnormal levels of other serum enzymes: Secondary | ICD-10-CM | POA: Diagnosis not present

## 2023-05-10 DIAGNOSIS — Z125 Encounter for screening for malignant neoplasm of prostate: Secondary | ICD-10-CM | POA: Diagnosis not present

## 2023-05-14 ENCOUNTER — Inpatient Hospital Stay: Payer: Medicare HMO | Attending: Internal Medicine | Admitting: Internal Medicine

## 2023-05-14 ENCOUNTER — Encounter: Payer: Self-pay | Admitting: Internal Medicine

## 2023-05-14 ENCOUNTER — Inpatient Hospital Stay: Payer: Medicare HMO

## 2023-05-14 VITALS — BP 139/89 | HR 94 | Temp 100.0°F | Wt 239.0 lb

## 2023-05-14 DIAGNOSIS — E1165 Type 2 diabetes mellitus with hyperglycemia: Secondary | ICD-10-CM | POA: Diagnosis not present

## 2023-05-14 DIAGNOSIS — E7800 Pure hypercholesterolemia, unspecified: Secondary | ICD-10-CM | POA: Diagnosis not present

## 2023-05-14 DIAGNOSIS — R0602 Shortness of breath: Secondary | ICD-10-CM

## 2023-05-14 DIAGNOSIS — G8929 Other chronic pain: Secondary | ICD-10-CM | POA: Diagnosis not present

## 2023-05-14 DIAGNOSIS — M549 Dorsalgia, unspecified: Secondary | ICD-10-CM

## 2023-05-14 DIAGNOSIS — C801 Malignant (primary) neoplasm, unspecified: Secondary | ICD-10-CM | POA: Insufficient documentation

## 2023-05-14 DIAGNOSIS — N179 Acute kidney failure, unspecified: Secondary | ICD-10-CM

## 2023-05-14 DIAGNOSIS — R1310 Dysphagia, unspecified: Secondary | ICD-10-CM | POA: Diagnosis not present

## 2023-05-14 DIAGNOSIS — E1140 Type 2 diabetes mellitus with diabetic neuropathy, unspecified: Secondary | ICD-10-CM | POA: Diagnosis not present

## 2023-05-14 DIAGNOSIS — Z7984 Long term (current) use of oral hypoglycemic drugs: Secondary | ICD-10-CM | POA: Insufficient documentation

## 2023-05-14 DIAGNOSIS — I10 Essential (primary) hypertension: Secondary | ICD-10-CM

## 2023-05-14 DIAGNOSIS — R102 Pelvic and perineal pain: Secondary | ICD-10-CM | POA: Diagnosis not present

## 2023-05-14 DIAGNOSIS — R978 Other abnormal tumor markers: Secondary | ICD-10-CM | POA: Insufficient documentation

## 2023-05-14 DIAGNOSIS — K769 Liver disease, unspecified: Secondary | ICD-10-CM

## 2023-05-14 DIAGNOSIS — R970 Elevated carcinoembryonic antigen [CEA]: Secondary | ICD-10-CM | POA: Insufficient documentation

## 2023-05-14 DIAGNOSIS — C787 Secondary malignant neoplasm of liver and intrahepatic bile duct: Secondary | ICD-10-CM | POA: Diagnosis not present

## 2023-05-14 DIAGNOSIS — G893 Neoplasm related pain (acute) (chronic): Secondary | ICD-10-CM | POA: Insufficient documentation

## 2023-05-14 DIAGNOSIS — Z87891 Personal history of nicotine dependence: Secondary | ICD-10-CM

## 2023-05-14 DIAGNOSIS — E119 Type 2 diabetes mellitus without complications: Secondary | ICD-10-CM | POA: Diagnosis not present

## 2023-05-14 LAB — CBC WITH DIFFERENTIAL/PLATELET
Abs Immature Granulocytes: 0.07 10*3/uL (ref 0.00–0.07)
Basophils Absolute: 0.1 10*3/uL (ref 0.0–0.1)
Basophils Relative: 1 %
Eosinophils Absolute: 0.1 10*3/uL (ref 0.0–0.5)
Eosinophils Relative: 1 %
HCT: 41.9 % (ref 39.0–52.0)
Hemoglobin: 13.7 g/dL (ref 13.0–17.0)
Immature Granulocytes: 1 %
Lymphocytes Relative: 10 %
Lymphs Abs: 1.2 10*3/uL (ref 0.7–4.0)
MCH: 27.5 pg (ref 26.0–34.0)
MCHC: 32.7 g/dL (ref 30.0–36.0)
MCV: 84.1 fL (ref 80.0–100.0)
Monocytes Absolute: 2 10*3/uL — ABNORMAL HIGH (ref 0.1–1.0)
Monocytes Relative: 17 %
Neutro Abs: 8.4 10*3/uL — ABNORMAL HIGH (ref 1.7–7.7)
Neutrophils Relative %: 70 %
Platelets: 424 10*3/uL — ABNORMAL HIGH (ref 150–400)
RBC: 4.98 MIL/uL (ref 4.22–5.81)
RDW: 14.6 % (ref 11.5–15.5)
WBC: 11.8 10*3/uL — ABNORMAL HIGH (ref 4.0–10.5)
nRBC: 0 % (ref 0.0–0.2)

## 2023-05-14 LAB — URINALYSIS, COMPLETE (UACMP) WITH MICROSCOPIC
Bilirubin Urine: NEGATIVE
Glucose, UA: NEGATIVE mg/dL
Hgb urine dipstick: NEGATIVE
Ketones, ur: 5 mg/dL — AB
Nitrite: NEGATIVE
Protein, ur: 100 mg/dL — AB
Specific Gravity, Urine: 1.026 (ref 1.005–1.030)
WBC, UA: >50 WBC/hpf (ref 0–5)
pH: 5 (ref 5.0–8.0)

## 2023-05-14 LAB — COMPREHENSIVE METABOLIC PANEL
ALT: 66 U/L — ABNORMAL HIGH (ref 0–44)
AST: 94 U/L — ABNORMAL HIGH (ref 15–41)
Albumin: 3.1 g/dL — ABNORMAL LOW (ref 3.5–5.0)
Alkaline Phosphatase: 681 U/L — ABNORMAL HIGH (ref 38–126)
Anion gap: 10 (ref 5–15)
BUN: 15 mg/dL (ref 8–23)
CO2: 26 mmol/L (ref 22–32)
Calcium: 9.1 mg/dL (ref 8.9–10.3)
Chloride: 100 mmol/L (ref 98–111)
Creatinine, Ser: 1.39 mg/dL — ABNORMAL HIGH (ref 0.61–1.24)
GFR, Estimated: 53 mL/min — ABNORMAL LOW (ref >60)
Glucose, Bld: 165 mg/dL — ABNORMAL HIGH (ref 70–99)
Potassium: 4.1 mmol/L (ref 3.5–5.1)
Sodium: 136 mmol/L (ref 135–145)
Total Bilirubin: 1.3 mg/dL — ABNORMAL HIGH (ref 0.3–1.2)
Total Protein: 7.6 g/dL (ref 6.5–8.1)

## 2023-05-14 LAB — LACTATE DEHYDROGENASE: LDH: 1025 U/L — ABNORMAL HIGH (ref 98–192)

## 2023-05-14 LAB — PSA: Prostatic Specific Antigen: 0.33 ng/mL (ref 0.00–4.00)

## 2023-05-14 LAB — APTT: aPTT: 30 s (ref 24–36)

## 2023-05-14 LAB — PROTIME-INR
INR: 1.1 (ref 0.8–1.2)
Prothrombin Time: 14.6 s (ref 11.4–15.2)

## 2023-05-14 MED ORDER — TRAMADOL HCL 50 MG PO TABS
50.0000 mg | ORAL_TABLET | Freq: Three times a day (TID) | ORAL | 0 refills | Status: DC | PRN
Start: 2023-05-14 — End: 2023-06-01

## 2023-05-14 NOTE — Progress Notes (Signed)
Patient started having some dizziness about 4 months ago, 3 weeks ago his appetite decreased due to food getting or feeling stuck in his throat. Shortness of breath started about 6 months ago now. He is also having some sharp stabbing pain in his left lower side near lower back which started about 2 weeks ago. Patient had a CT scan on 05/07/2023).

## 2023-05-14 NOTE — Progress Notes (Signed)
Provided contact information for nurse navigator. Will assist in arranging liver biopsy. Last 81mg  asa 4-5 days ago. No longer taking.

## 2023-05-14 NOTE — Assessment & Plan Note (Addendum)
#    OCT 2024- CT  AP- Diffuse liver metastases. No primary malignancy identified within the abdomen or pelvis.  Recommend CT scan of the chest ASAP. OCT 2024- PSA- [PCP-wnl]  # I discussed my concerns of malignancy is high based on the imaging.  Discussed that suspect metastatic disease rather than primary.  Recommend ultrasound-guided liver biopsy ASAP.  Discussed with Dr. Juliette Alcide skin radiology.  Recommend stopping aspirin-in anticipation of biopsy.  Also check labs  also blood work/tumor markers-CBC CMP CEA; CBC CMP;  CA 19-9.   # Difficulty with swallowing/dysphagia-again unclear etiology.  Await above CT chest.  If concerning would recommend EGD.  # DM- on metformin/glipizide; ozempic [well controlled]- stable..   # # Chronic back pain [Dr.Yarbrough]; # PAIN CONTROL: start tramadol-every 8 hours as needed.  MRI lumbar spine negative for any acute process/malignancy.  Positive for degenerative arthritis.; continue MOM   # #Incidental findings on Imaging  CT , 2024:Colonic diverticulosis, without radiographic evidence of Diverticulitis; Increased diffuse right renal parenchymal atrophy, with significantly decreased pelvicaliectasis or parapelvic cyst; Small umbilical hernia, which contains only fat-   I reviewed/discussed/counseled the patient.   Thank you Dr.Feldcpausch for allowing me to participate in the care of your pleasant patient. Please do not hesitate to contact me with questions or concerns in the interim.  #  DISPOSITION: # UA and culture- please order # Labs CBC CMP CEA; CA 19-9;AFP- PT/PTT- ordered # CT scan chest  ASAP # US guided liver Biopsy ASAP # follow up TBD- Dr.B  # I reviewed the blood work- with the patient in detail; also reviewed the imaging independently [as summarized above]; and with the patient in detail.   # 60 minutes face-to-face with the patient discussing the above plan of care; more than 50% of time spent on prognosis/ natural history; counseling and  coordination.

## 2023-05-14 NOTE — Patient Instructions (Signed)
#   HOLD off asprin- until further directions.

## 2023-05-14 NOTE — Progress Notes (Signed)
[facility] CONSULT NOTE  Patient Care Team: Marina Goodell, MD as PCP - General (Family Medicine) Earna Coder, MD as Consulting Physician (Oncology) Benita Gutter, RN as Oncology Nurse Navigator  CHIEF COMPLAINTS/PURPOSE OF CONSULTATION: Abnormal scans.   Oncology History   No history exists.    HISTORY OF PRESENTING ILLNESS: Patient ambulating-with assistance- wheel chair.  /Accompanied by wife.   Jordan [name] 77 y.o.  male longstanding history of smoking DM- on OHA; no prior history of malignancy; diabetes has been the to [place] for further evaluation recommendations of his abnormal imaging.  Patient started having some dizziness about 4 months ago, 3 weeks ago his appetite decreased due to food getting or feeling stuck in his throat in 2-3 weeks.  Chronic mild-moderate constipation not any worse.  Currently on milk of magnesia.  Shortness of breath started about 6 months ago now. He is also having some sharp stabbing pain in his left lower side near lower back which started about 2 weeks ago. Patient had a recent MRI of the back.  Prior cancer: melanoma- none Screening: Colonoscopy - small polyp->> [Dr.Elliot]; Hx of / Gastric ulcer [>>> ] PSA-    Review of Systems  Constitutional:  Positive for malaise/fatigue and weight loss. Negative for chills, diaphoresis and fever.  HENT:  Negative for nosebleeds and sore throat.   Eyes:  Negative for double vision.  Respiratory:  Negative for cough, hemoptysis, sputum production, shortness of breath and wheezing.   Cardiovascular:  Negative for chest pain, palpitations, orthopnea and leg swelling.  Gastrointestinal:  Positive for abdominal pain and heartburn. Negative for blood in stool, constipation, diarrhea, melena and vomiting.  Genitourinary:  Negative for dysuria, frequency and urgency.  Musculoskeletal:  Negative for back pain and joint pain.  Skin: Negative.  Negative for itching and rash.   Neurological:  Negative for dizziness, tingling, focal weakness, weakness and headaches.  Endo/Heme/Allergies:  Does not bruise/bleed easily.  Psychiatric/Behavioral:  Negative for depression. The patient is not nervous/anxious and does not have insomnia.     MEDICAL HISTORY:  Past Medical History:  Diagnosis Date   Anemia    Arthritis    Coronary artery disease    Diabetes mellitus without complication (HCC)    Diverticulosis    Duodenal ulcer    Erectile dysfunction    GERD (gastroesophageal reflux disease)    Hyperlipidemia    Hypertension     SURGICAL HISTORY: Past Surgical History:  Procedure Laterality Date   CARDIAC CATHETERIZATION     COLONOSCOPY WITH PROPOFOL N/A 05/31/2017   Procedure: COLONOSCOPY WITH PROPOFOL;  Surgeon: Scot Jun, MD;  Location: [facility] ENDOSCOPY;  Service: Endoscopy;  Laterality: N/A;   JOINT REPLACEMENT Left    knee   THORACIC DISCECTOMY N/A 08/09/2019   Procedure: T11-12 POSTERIOR FUSION WITH TRANSPEDICULAR DECOMPRESSION;  Surgeon: Venetia Night, MD;  Location: [facility] ORS;  Service: Neurosurgery;  Laterality: N/A;   VASECTOMY      SOCIAL HISTORY: Social History   Socioeconomic History   Marital status: Married    Spouse name: Not on file   Number of children: Not on file   Years of education: Not on file   Highest education level: Not on file  Occupational History   Not on file  Tobacco Use   Smoking status: Former    Current packs/day: 0.00    Types: Cigarettes    Quit date: 11/02/1987    Years since quitting: 35.5   Smokeless tobacco: Never  Vaping Use   Vaping status: Never Used  Substance and Sexual Activity   Alcohol use: No   Drug use: No   Sexual activity: Not on file  Other Topics Concern   Not on file  Social History Narrative   Not on file   Social Determinants of Health   Financial Resource Strain: Not on file  Food Insecurity: Not on file  Transportation Needs: Not on file  Physical Activity: Unknown  (01/11/2018)   Received from [facility]   Exercise Vital Sign    Days of Exercise per Week: 3 days    Minutes of Exercise per Session: Not on file  Stress: Not on file  Social Connections: Not on file  Intimate Partner Violence: Not on file    FAMILY HISTORY: Family History  Problem Relation Age of Onset   Hypertension Mother     ALLERGIES:  is allergic to actos [pioglitazone] and amoxicillin.  MEDICATIONS:  Current Outpatient Medications  Medication Sig Dispense Refill   amLODipine (NORVASC) 10 MG tablet Take 10 mg by mouth daily.     ascorbic acid (VITAMIN C) 1000 MG tablet Take 1,000 mg by mouth daily.     atorvastatin (LIPITOR) 20 MG tablet Take 20 mg by mouth daily.     carvedilol (COREG) 3.125 MG tablet Take 3.125 mg by mouth 2 (two) times daily.     cholecalciferol (VITAMIN D) 25 MCG (1000 UT) tablet Take 1,000 Units by mouth daily.     CINNAMON PO Take 1,000 mg by mouth daily.      gabapentin (NEURONTIN) 100 MG capsule Take 300 mg by mouth at bedtime.     glipiZIDE (GLUCOTROL) 5 MG tablet Take 5 mg by mouth daily.     hydrochlorothiazide (HYDRODIURIL) 25 MG tablet Take 25 mg by mouth daily.     magnesium oxide (MAG-OX) 400 MG tablet Take 400 mg by mouth daily.     meloxicam (MOBIC) 15 MG tablet Take 1 tablet (15 mg total) by mouth daily. Take with food. 30 tablet 1   metFORMIN (GLUCOPHAGE) 1000 MG tablet Take 1,000 mg by mouth 2 (two) times daily with a meal.     omega-3 acid ethyl esters (LOVAZA) 1 g capsule Take 1 capsule by mouth 2 (two) times daily.     ramipril (ALTACE) 10 MG capsule Take 10 mg by mouth 2 (two) times daily.      Semaglutide, 2 MG/DOSE, 8 MG/3ML SOPN Inject into the skin.     traMADol (ULTRAM) 50 MG tablet Take 1 tablet (50 mg total) by mouth every 8 (eight) hours as needed. 30 tablet 0   vitamin B-12 (CYANOCOBALAMIN) 1000 MCG tablet Take 1,000 mcg by mouth daily.     No current facility-administered medications for this visit.     PHYSICAL EXAMINATION:  Vitals:   05/14/23 1409  BP: 139/89  Pulse: 94  Temp: 100 F (37.8 C)  SpO2: 96%   Filed Weights   05/14/23 1409  Weight: 239 lb (108.4 kg)    Physical Exam Vitals and nursing note reviewed.  HENT:     Head: Normocephalic and atraumatic.     Mouth/Throat:     Pharynx: Oropharynx is clear.  Eyes:     Extraocular Movements: Extraocular movements intact.     Pupils: Pupils are equal, round, and reactive to light.  Cardiovascular:     Rate and Rhythm: Normal rate and regular rhythm.  Pulmonary:     Comments: Decreased breath sounds bilaterally.  Abdominal:     Palpations: Abdomen is soft.  Musculoskeletal:        General: Normal range of motion.     Cervical back: Normal range of motion.  Skin:    General: Skin is warm.  Neurological:     General: No focal deficit present.     Mental Status: He is alert and oriented to person, place, and time.  Psychiatric:        Behavior: Behavior normal.        Judgment: Judgment normal.     LABORATORY DATA:  I have reviewed the data as listed Lab Results  Component Value Date   WBC 11.8 (H) 05/14/2023   HGB 13.7 05/14/2023   HCT 41.9 05/14/2023   MCV 84.1 05/14/2023   PLT 424 (H) 05/14/2023   Recent Labs    05/14/23 1530  NA 136  K 4.1  CL 100  CO2 26  GLUCOSE 165*  BUN 15  CREATININE 1.39*  CALCIUM 9.1  GFRNONAA 53*  PROT 7.6  ALBUMIN 3.1*  AST 94*  ALT 66*  ALKPHOS 681*  BILITOT 1.3*    RADIOGRAPHIC STUDIES: I have personally reviewed the radiological images as listed and agreed with the findings in the report. MR LUMBAR SPINE WO CONTRAST  Result Date: 05/12/2023 CLINICAL DATA:  Low back pain, bilateral leg numbness and weakness EXAM: MRI LUMBAR SPINE WITHOUT CONTRAST TECHNIQUE: Multiplanar, multisequence MR imaging of the lumbar spine was performed. No intravenous contrast was administered. COMPARISON:  07/24/2019 FINDINGS: Segmentation:  Standard. Alignment:  Physiologic.  Vertebrae: No acute fracture, evidence of discitis, or aggressive bone lesion. Conus medullaris and cauda equina: Mild myelomalacia of the lower thoracic spinal cord at the level of T11-12 partially visualized. Conus extends to the L1-2 level. Conus and cauda equina appear normal. Paraspinal and other soft tissues: No acute paraspinal abnormality. Disc levels: Disc spaces: Posterior lumbar fusion at T11-12. Degenerative disease with disc height loss at T12-L1, L1-2, L2-3, L3-4, L4-5 and L5-S1. reactive endplate edema on either side of the L1-2 disc space. T12-L1: Broad-based disc bulge. Moderate left facet arthropathy. No right facet arthropathy. No right foraminal stenosis. Moderate left foraminal stenosis. L1-L2: Broad-based disc bulge with a right paracentral disc protrusion. Right subarticular recess stenosis. Moderate bilateral facet arthropathy. Moderate spinal stenosis. No left foraminal stenosis. Mild right foraminal stenosis. L2-L3: Broad-based disc osteophyte complex. Moderate bilateral facet arthropathy. Bilateral subarticular recess stenosis. Moderate spinal stenosis. Mild right foraminal stenosis. No left foraminal stenosis. L3-L4: Broad-based disc osteophyte complex. Moderate bilateral facet arthropathy. Moderate spinal stenosis. No left foraminal stenosis. Mild right foraminal stenosis. L4-L5: Broad-based disc osteophyte complex. Moderate bilateral facet arthropathy. Severe spinal stenosis. Moderate right foraminal stenosis. Moderate left foraminal stenosis. L5-S1: Broad-based disc osteophyte complex. Moderate bilateral facet arthropathy with a small left posterior synovial cyst. Severe left foraminal stenosis. Moderate right foraminal stenosis. Bilateral subarticular recess stenosis. IMPRESSION: 1. Diffuse lumbar spine spondylosis as described above. 2. No acute osseous injury of the lumbar spine. Electronically Signed   By: Elige Ko M.D.   On: 05/12/2023 09:41   CT ABDOMEN PELVIS W  CONTRAST  Result Date: 05/07/2023 CLINICAL DATA:  Abdominal pain for several weeks. Diverticulosis. Duodenal ulcer. * Tracking Code: BO * EXAM: CT ABDOMEN AND PELVIS WITH CONTRAST TECHNIQUE: Multidetector CT imaging of the abdomen and pelvis was performed using the standard protocol following bolus administration of intravenous contrast. RADIATION DOSE REDUCTION: This exam was performed according to the departmental dose-optimization program which includes automated exposure control,  adjustment of the mA and/or kV according to patient size and/or use of iterative reconstruction technique. CONTRAST:  OMNIPAQUE IOHEXOL 300 MG/ML  SOLN COMPARISON:  12/04/2021 FINDINGS: Lower Chest: No acute findings. Hepatobiliary: Numerous hypovascular masses are seen throughout the liver which are new since previous study, consistent with diffuse liver metastases. Insert Pancreas:  No mass or inflammatory changes. Spleen: Within normal limits in size and appearance. Adrenals/Urinary Tract: No suspicious masses identified. Increased diffuse right renal parenchymal atrophy is seen since previous study, with significantly decreased pelvicaliectasis or parapelvic cyst. No evidence of ureteral calculi or dilatation. Unremarkable unopacified urinary bladder. Stomach/Bowel: No evidence of obstruction, inflammatory process or abnormal fluid collections. Diverticulosis is seen mainly involving the descending and sigmoid colon, however there is no evidence of diverticulitis. Normal appendix visualized. Vascular/Lymphatic: Shotty sub-cm lymph nodes are seen throughout the abdominal retroperitoneum none of which are pathologically enlarged. No pathologically enlarged lymph nodes. No acute vascular findings. Reproductive:  No mass or other significant abnormality. Other:  Small umbilical hernia is seen, which contains only fat. Musculoskeletal:  No suspicious bone lesions identified. IMPRESSION: Diffuse liver metastases. No primary  malignancy identified within the abdomen or pelvis. Colonic diverticulosis, without radiographic evidence of diverticulitis. Increased diffuse right renal parenchymal atrophy, with significantly decreased pelvicaliectasis or parapelvic cyst. Small umbilical hernia, which contains only fat. Electronically Signed   By: Danae Orleans M.D.   On: 05/07/2023 15:34     Liver lesion #  OCT 2024- CT  AP- Diffuse liver metastases. No primary malignancy identified within the abdomen or pelvis.  Recommend CT scan of the chest ASAP. OCT 2024- PSA- [PCP-wnl]  # I discussed my concerns of malignancy is high based on the imaging.  Discussed that suspect metastatic disease rather than primary.  Recommend ultrasound-guided liver biopsy ASAP.  Discussed with Dr. Juliette Alcide skin radiology.  Recommend stopping aspirin-in anticipation of biopsy.  Also check labs  also blood work/tumor markers-CBC CMP CEA; CBC CMP;  CA 19-9.   # Difficulty with swallowing/dysphagia-again unclear etiology.  Await above CT chest.  If concerning would recommend EGD.  # DM- on metformin/glipizide; ozempic [well controlled]- stable..   # # Chronic back pain [Dr.Yarbrough]; # PAIN CONTROL: start tramadol-every 8 hours as needed.  MRI lumbar spine negative for any acute process/malignancy.  Positive for degenerative arthritis.; continue MOM   # #Incidental findings on Imaging  CT , 2024:Colonic diverticulosis, without radiographic evidence of Diverticulitis; Increased diffuse right renal parenchymal atrophy, with significantly decreased pelvicaliectasis or parapelvic cyst; Small umbilical hernia, which contains only fat-   I reviewed/discussed/counseled the patient.   Thank you Dr.Feldcpausch for allowing me to participate in the care of your pleasant patient. Please do not hesitate to contact me with questions or concerns in the interim.  #  DISPOSITION: # UA and culture- please order # Labs CBC CMP CEA; CA 19-9;AFP- PT/PTT- ordered # CT scan  chest  ASAP # US guided liver Biopsy ASAP # follow up TBD- Dr.B  # I reviewed the blood work- with the patient in detail; also reviewed the imaging independently [as summarized above]; and with the patient in detail.   # 60 minutes face-to-face with the patient discussing the above plan of care; more than 50% of time spent on prognosis/ natural history; counseling and coordination.

## 2023-05-16 LAB — URINE CULTURE: Culture: >=100000 — AB

## 2023-05-16 LAB — CEA: CEA: 16316 ng/mL — ABNORMAL HIGH (ref 0.0–4.7)

## 2023-05-16 LAB — AFP TUMOR MARKER: AFP, Serum, Tumor Marker: 2.5 ng/mL (ref 0.0–8.4)

## 2023-05-16 LAB — CANCER ANTIGEN 19-9: CA 19-9: 150818 U/mL — ABNORMAL HIGH (ref 0–35)

## 2023-05-17 ENCOUNTER — Telehealth: Payer: Self-pay

## 2023-05-17 ENCOUNTER — Other Ambulatory Visit: Payer: Self-pay | Admitting: Internal Medicine

## 2023-05-17 MED ORDER — CIPROFLOXACIN HCL 500 MG PO TABS: 500.0000 mg | ORAL_TABLET | Freq: Two times a day (BID) | ORAL | 0 refills | Status: DC

## 2023-05-17 NOTE — Telephone Encounter (Signed)
Liver biopsy scheduled for May 27, 2023  Arrive at 0730  for 0830 appointment Come into the Heart and Vascular entrance at [facility]. This entrance is located in the front of the hospital. Do not eat or drink anything after midnight You will need a driver for this procedure. Called and reviewed instructions with spouse, Britta Mccreedy. All questions answered.

## 2023-05-18 ENCOUNTER — Ambulatory Visit
Admission: RE | Admit: 2023-05-18 | Discharge: 2023-05-18 | Disposition: A | Discharge status: Home / Self Care | Payer: Medicare HMO | Source: Ambulatory Visit | Attending: Internal Medicine | Admitting: Internal Medicine

## 2023-05-18 DIAGNOSIS — C801 Malignant (primary) neoplasm, unspecified: Secondary | ICD-10-CM | POA: Insufficient documentation

## 2023-05-18 DIAGNOSIS — C787 Secondary malignant neoplasm of liver and intrahepatic bile duct: Secondary | ICD-10-CM | POA: Insufficient documentation

## 2023-05-18 DIAGNOSIS — K769 Liver disease, unspecified: Secondary | ICD-10-CM | POA: Insufficient documentation

## 2023-05-18 DIAGNOSIS — C7951 Secondary malignant neoplasm of bone: Secondary | ICD-10-CM | POA: Diagnosis not present

## 2023-05-18 DIAGNOSIS — R188 Other ascites: Secondary | ICD-10-CM | POA: Insufficient documentation

## 2023-05-18 DIAGNOSIS — I700 Atherosclerosis of aorta: Secondary | ICD-10-CM | POA: Insufficient documentation

## 2023-05-18 MED ORDER — IOHEXOL 300 MG/ML  SOLN
75.0000 mL | Freq: Once | INTRAMUSCULAR | Status: AC | PRN
  Administered 2023-05-18: 75 mL via INTRAVENOUS

## 2023-05-18 NOTE — Progress Notes (Signed)
Irish Lack, MD sent to [facility] PROCEDURE / BIOPSY REVIEW Date: 05/17/23  Requested Biopsy site: Liver lesion Reason for request: Multiple liver masses Imaging review: Best seen on CT on 10/4  Decision: Approved Imaging modality to perform: Ultrasound Schedule with: Moderate Sedation Schedule for: Any VIR  Additional comments: @Schedulers .  Please contact me with questions, concerns, or if issue pertaining to this request arise.  Reola Calkins, MD [facility]

## 2023-05-26 ENCOUNTER — Other Ambulatory Visit: Payer: Self-pay | Admitting: Student

## 2023-05-26 DIAGNOSIS — Z01812 Encounter for preprocedural laboratory examination: Secondary | ICD-10-CM

## 2023-05-26 NOTE — Progress Notes (Signed)
Patient for US guided Liver Biopsy on Thurs 05/27/2023, I called and spoke with the patient's wife, Jordan [name] on the phone and gave pre-procedure instructions. Jordan [name] was made aware to have the patient here at 9:30a, NPO after MN prior to procedure as well as driver post procedure/recovery/discharge. Jordan [name] stated understanding.  Called 05/26/2023  (Pt's wife, Jordan [name] stated that the pt has not taken any Aspirin in over a week even though it is not on his medication list)

## 2023-05-27 ENCOUNTER — Ambulatory Visit
Admission: RE | Admit: 2023-05-27 | Discharge: 2023-05-27 | Disposition: A | Discharge status: Home / Self Care | Payer: Medicare HMO | Source: Ambulatory Visit | Attending: Internal Medicine | Admitting: Internal Medicine

## 2023-05-27 ENCOUNTER — Ambulatory Visit: Payer: Medicare HMO

## 2023-05-27 ENCOUNTER — Other Ambulatory Visit: Payer: Self-pay

## 2023-05-27 DIAGNOSIS — Z01812 Encounter for preprocedural laboratory examination: Secondary | ICD-10-CM

## 2023-05-27 DIAGNOSIS — R160 Hepatomegaly, not elsewhere classified: Secondary | ICD-10-CM | POA: Insufficient documentation

## 2023-05-27 DIAGNOSIS — K5730 Diverticulosis of large intestine without perforation or abscess without bleeding: Secondary | ICD-10-CM | POA: Diagnosis not present

## 2023-05-27 DIAGNOSIS — C787 Secondary malignant neoplasm of liver and intrahepatic bile duct: Secondary | ICD-10-CM | POA: Insufficient documentation

## 2023-05-27 DIAGNOSIS — K769 Liver disease, unspecified: Secondary | ICD-10-CM

## 2023-05-27 DIAGNOSIS — C229 Malignant neoplasm of liver, not specified as primary or secondary: Secondary | ICD-10-CM | POA: Diagnosis not present

## 2023-05-27 LAB — COMPREHENSIVE METABOLIC PANEL
ALT: 100 U/L — ABNORMAL HIGH (ref 0–44)
AST: 235 U/L — ABNORMAL HIGH (ref 15–41)
Albumin: 2.6 g/dL — ABNORMAL LOW (ref 3.5–5.0)
Alkaline Phosphatase: 839 U/L — ABNORMAL HIGH (ref 38–126)
Anion gap: 16 — ABNORMAL HIGH (ref 5–15)
BUN: 22 mg/dL (ref 8–23)
CO2: 22 mmol/L (ref 22–32)
Calcium: 8.9 mg/dL (ref 8.9–10.3)
Chloride: 95 mmol/L — ABNORMAL LOW (ref 98–111)
Creatinine, Ser: 1.51 mg/dL — ABNORMAL HIGH (ref 0.61–1.24)
GFR, Estimated: 48 mL/min — ABNORMAL LOW (ref >60)
Glucose, Bld: 152 mg/dL — ABNORMAL HIGH (ref 70–99)
Potassium: 4.8 mmol/L (ref 3.5–5.1)
Sodium: 133 mmol/L — ABNORMAL LOW (ref 135–145)
Total Bilirubin: 4.1 mg/dL — ABNORMAL HIGH (ref 0.3–1.2)
Total Protein: 7.1 g/dL (ref 6.5–8.1)

## 2023-05-27 LAB — PROTIME-INR
INR: 1.2 (ref 0.8–1.2)
Prothrombin Time: 15.6 s — ABNORMAL HIGH (ref 11.4–15.2)

## 2023-05-27 LAB — CBC
HCT: 46.5 % (ref 39.0–52.0)
Hemoglobin: 15 g/dL (ref 13.0–17.0)
MCH: 26.8 pg (ref 26.0–34.0)
MCHC: 32.3 g/dL (ref 30.0–36.0)
MCV: 83 fL (ref 80.0–100.0)
Platelets: 454 10*3/uL — ABNORMAL HIGH (ref 150–400)
RBC: 5.6 MIL/uL (ref 4.22–5.81)
RDW: 16.9 % — ABNORMAL HIGH (ref 11.5–15.5)
WBC: 13.8 10*3/uL — ABNORMAL HIGH (ref 4.0–10.5)
nRBC: 0.1 % (ref 0.0–0.2)

## 2023-05-27 LAB — GLUCOSE, CAPILLARY: Glucose-Capillary: 163 mg/dL — ABNORMAL HIGH (ref 70–99)

## 2023-05-27 MED ORDER — SODIUM CHLORIDE 0.9 % IV SOLN: INTRAVENOUS | Status: DC

## 2023-05-27 MED ORDER — MIDAZOLAM HCL 2 MG/2ML IJ SOLN
INTRAMUSCULAR | Status: AC
  Filled 2023-05-27: qty 2

## 2023-05-27 MED ORDER — FENTANYL CITRATE (PF) 100 MCG/2ML IJ SOLN
INTRAMUSCULAR | Status: AC
  Filled 2023-05-27: qty 2

## 2023-05-27 MED ORDER — ONDANSETRON HCL 4 MG/2ML IJ SOLN
INTRAMUSCULAR | Status: AC
  Filled 2023-05-27: qty 2

## 2023-05-27 MED ORDER — MIDAZOLAM HCL 5 MG/5ML IJ SOLN
INTRAMUSCULAR | Status: AC | PRN
  Administered 2023-05-27: 1 mg via INTRAVENOUS

## 2023-05-27 MED ORDER — SODIUM CHLORIDE 0.9% FLUSH: 3.0000 mL | Freq: Two times a day (BID) | INTRAVENOUS | Status: DC

## 2023-05-27 MED ORDER — ONDANSETRON HCL 4 MG/2ML IJ SOLN
INTRAMUSCULAR | Status: AC | PRN
  Administered 2023-05-27: 4 mg via INTRAVENOUS

## 2023-05-27 MED ORDER — LIDOCAINE HCL (PF) 1 % IJ SOLN
5.0000 mL | Freq: Once | INTRAMUSCULAR | Status: AC
  Administered 2023-05-27: 5 mL via SUBCUTANEOUS
  Filled 2023-05-27: qty 5

## 2023-05-27 MED ORDER — FENTANYL CITRATE (PF) 100 MCG/2ML IJ SOLN
INTRAMUSCULAR | Status: AC | PRN
Start: 2023-05-27 — End: 2023-05-27
  Administered 2023-05-27: 50 ug via INTRAVENOUS

## 2023-05-27 NOTE — Procedures (Signed)
Interventional Radiology Procedure Note  Procedure: US Guided Biopsy of liver lesion  Complications: None  Estimated Blood Loss: < 10 mL  Findings: 18 G core biopsy of right lobe liver lesion performed under US guidance.  Three core samples obtained and sent to Pathology.  Venetia Night. Kathlene Cote, M.D Pager:  [phone]

## 2023-05-27 NOTE — Consult Note (Signed)
Chief Complaint: Patient was seen in consultation today for live lesion biopsy at the request of Dr Modena Nunnery  Supervising Physician: Irish Lack  Patient Status: [facility] - Out-pt  History of Present Illness: Jordan [name] is a 77 y.o. male with a history of back pain,neuropathy, leg weakness, smoking, and DM. The patient started having symptoms of shortness of breath about 6 months ago, dizziness about 4 months ago and decreased appetite about 3 weeks ago with a 30 pound weight loss. Patient also was diagnosed with a UTI on 10/11 and has completed a course of antibiotics.On 05/07/23 a CT of his abdomen / pelvis revealed diffuse liver metastases, no primary malignancy was noted. A request for liver biopsy was requested by Dr. Modena Nunnery for a liver lesion biopsy by IR.   Past Medical History:  Diagnosis Date   Anemia    Arthritis    Coronary artery disease    Diabetes mellitus without complication (HCC)    Diverticulosis    Duodenal ulcer    Erectile dysfunction    GERD (gastroesophageal reflux disease)    Hyperlipidemia    Hypertension     Past Surgical History:  Procedure Laterality Date   CARDIAC CATHETERIZATION     COLONOSCOPY WITH PROPOFOL N/A 05/31/2017   Procedure: COLONOSCOPY WITH PROPOFOL;  Surgeon: Scot Jun, MD;  Location: [facility] ENDOSCOPY;  Service: Endoscopy;  Laterality: N/A;   JOINT REPLACEMENT Left    knee   THORACIC DISCECTOMY N/A 08/09/2019   Procedure: T11-12 POSTERIOR FUSION WITH TRANSPEDICULAR DECOMPRESSION;  Surgeon: Venetia Night, MD;  Location: [facility] ORS;  Service: Neurosurgery;  Laterality: N/A;   VASECTOMY      Allergies: Actos [pioglitazone] and Amoxicillin  Medications: Prior to Admission medications   Medication Sig Start Date End Date Taking? Authorizing Provider  amLODipine (NORVASC) 10 MG tablet Take 10 mg by mouth daily.   Yes [provider]  ascorbic acid (VITAMIN C) 1000 MG tablet Take 1,000 mg by mouth daily.    Yes [provider]  atorvastatin (LIPITOR) 20 MG tablet Take 20 mg by mouth daily.   Yes [provider]  cholecalciferol (VITAMIN D) 25 MCG (1000 UT) tablet Take 1,000 Units by mouth daily.   Yes [provider]  CINNAMON PO Take 1,000 mg by mouth daily.    Yes [provider]  glipiZIDE (GLUCOTROL) 5 MG tablet Take 5 mg by mouth daily. 07/17/19  Yes [provider]  hydrochlorothiazide (HYDRODIURIL) 25 MG tablet Take 25 mg by mouth daily.   Yes [provider]  magnesium oxide (MAG-OX) 400 MG tablet Take 400 mg by mouth daily.   Yes [provider]  meloxicam (MOBIC) 15 MG tablet Take 1 tablet (15 mg total) by mouth daily. Take with food. 04/20/23 04/19/24 Yes Drake Leach, PA-C  metFORMIN (GLUCOPHAGE) 1000 MG tablet Take 1,000 mg by mouth 2 (two) times daily with a meal.   Yes [provider]  omega-3 acid ethyl esters (LOVAZA) 1 g capsule Take 1 capsule by mouth 2 (two) times daily.   Yes [provider]  ramipril (ALTACE) 10 MG capsule Take 10 mg by mouth 2 (two) times daily.    Yes [provider]  Semaglutide, 2 MG/DOSE, 8 MG/3ML SOPN Inject into the skin. 08/24/22  Yes [provider]  traMADol (ULTRAM) 50 MG tablet Take 1 tablet (50 mg total) by mouth every 8 (eight) hours as needed. 05/14/23  Yes Earna Coder, MD  vitamin B-12 (CYANOCOBALAMIN)  1000 MCG tablet Take 1,000 mcg by mouth daily.   Yes [provider]  carvedilol (COREG) 3.125 MG tablet Take 3.125 mg by mouth 2 (two) times daily. Patient not taking: Reported on 05/27/2023 10/28/21   [provider]  ciprofloxacin (CIPRO) 500 MG tablet Take 1 tablet (500 mg total) by mouth 2 (two) times daily. 05/17/23   Earna Coder, MD  gabapentin (NEURONTIN) 100 MG capsule Take 300 mg by mouth at bedtime. Patient not taking: Reported on 05/27/2023 07/12/19   [provider]     Family History  Problem  Relation Age of Onset   Hypertension Mother     Social History   Socioeconomic History   Marital status: Married    Spouse name: Not on file   Number of children: Not on file   Years of education: Not on file   Highest education level: Not on file  Occupational History   Not on file  Tobacco Use   Smoking status: Former    Current packs/day: 0.00    Types: Cigarettes    Quit date: 11/02/1987    Years since quitting: 35.5   Smokeless tobacco: Never  Vaping Use   Vaping status: Never Used  Substance and Sexual Activity   Alcohol use: No   Drug use: No   Sexual activity: Not on file  Other Topics Concern   Not on file  Social History Narrative   Not on file   Social Determinants of Health   Financial Resource Strain: Not on file  Food Insecurity: Not on file  Transportation Needs: Not on file  Physical Activity: Unknown (01/11/2018)   Received from [facility]   Exercise Vital Sign    Days of Exercise per Week: 3 days    Minutes of Exercise per Session: Not on file  Stress: Not on file  Social Connections: Not on file     Review of Systems: A 12 point ROS discussed and pertinent positives are indicated in the HPI above.  All other systems are negative.  Review of Systems  Constitutional:  Positive for chills, fatigue and unexpected weight change. Negative for activity change, appetite change and fever.       30 pound weight loss in last 3 weeks  Respiratory:  Negative for cough, chest tightness and shortness of breath.   Cardiovascular:  Negative for chest pain, palpitations and leg swelling.  Gastrointestinal:  Positive for nausea. Negative for abdominal distention and abdominal pain.  Genitourinary:  Negative for difficulty urinating.  Musculoskeletal:  Positive for back pain.  Skin:  Negative for color change.  Neurological:  Negative for dizziness, speech difficulty and headaches.  Psychiatric/Behavioral:  Negative for behavioral problems and  confusion.     Vital Signs: BP 132/81   Pulse 95   Temp 97.9 F (36.6 C) (Oral)   Ht 5\' 11"  (1.803 m)   Wt 105.9 kg   SpO2 96%   BMI 32.57 kg/m   Advance Care Plan: None on file  Full code per patient   Physical Exam Constitutional:      Appearance: Normal appearance. He is obese. He is not ill-appearing.  HENT:     Mouth/Throat:     Mouth: Mucous membranes are moist.  Cardiovascular:     Rate and Rhythm: Normal rate and regular rhythm.     Pulses: Normal pulses.     Heart sounds: Normal heart sounds.  Pulmonary:     Effort: Pulmonary effort is normal.  Breath sounds: Normal breath sounds. No wheezing.  Abdominal:     General: Bowel sounds are normal. There is no distension.     Palpations: Abdomen is soft.  Musculoskeletal:     Right lower leg: No edema.     Left lower leg: No edema.  Neurological:     Mental Status: He is alert.    Imaging: CT ABDOMEN PELVIS W CONTRAST  Result Date: 05/07/2023 CLINICAL DATA:  Abdominal pain for several weeks. Diverticulosis. Duodenal ulcer. * Tracking Code: BO * EXAM: CT ABDOMEN AND PELVIS WITH CONTRAST TECHNIQUE: Multidetector CT imaging of the abdomen and pelvis was performed using the standard protocol following bolus administration of intravenous contrast. RADIATION DOSE REDUCTION: This exam was performed according to the departmental dose-optimization program which includes automated exposure control, adjustment of the mA and/or kV according to patient size and/or use of iterative reconstruction technique. CONTRAST:  OMNIPAQUE IOHEXOL 300 MG/ML  SOLN COMPARISON:  12/04/2021 FINDINGS: Lower Chest: No acute findings. Hepatobiliary: Numerous hypovascular masses are seen throughout the liver which are new since previous study, consistent with diffuse liver metastases. Insert Pancreas:  No mass or inflammatory changes. Spleen: Within normal limits in size and appearance. Adrenals/Urinary Tract: No suspicious masses identified.  Increased diffuse right renal parenchymal atrophy is seen since previous study, with significantly decreased pelvicaliectasis or parapelvic cyst. No evidence of ureteral calculi or dilatation. Unremarkable unopacified urinary bladder. Stomach/Bowel: No evidence of obstruction, inflammatory process or abnormal fluid collections. Diverticulosis is seen mainly involving the descending and sigmoid colon, however there is no evidence of diverticulitis. Normal appendix visualized. Vascular/Lymphatic: Shotty sub-cm lymph nodes are seen throughout the abdominal retroperitoneum none of which are pathologically enlarged. No pathologically enlarged lymph nodes. No acute vascular findings. Reproductive:  No mass or other significant abnormality. Other:  Small umbilical hernia is seen, which contains only fat. Musculoskeletal:  No suspicious bone lesions identified. IMPRESSION: Diffuse liver metastases. No primary malignancy identified within the abdomen or pelvis. Colonic diverticulosis, without radiographic evidence of diverticulitis. Increased diffuse right renal parenchymal atrophy, with significantly decreased pelvicaliectasis or parapelvic cyst. Small umbilical hernia, which contains only fat. Electronically Signed   By: Danae Orleans M.D.   On: 05/07/2023 15:34    Labs:  CBC: Recent Labs    05/14/23 1530  WBC 11.8*  HGB 13.7  HCT 41.9  PLT 424*    COAGS: Recent Labs    05/14/23 1530  INR 1.1  APTT 30    BMP: Recent Labs    05/14/23 1530  NA 136  K 4.1  CL 100  CO2 26  GLUCOSE 165*  BUN 15  CALCIUM 9.1  CREATININE 1.39*  GFRNONAA 53*    LIVER FUNCTION TESTS: Recent Labs    05/14/23 1530  BILITOT 1.3*  AST 94*  ALT 66*  ALKPHOS 681*  PROT 7.6  ALBUMIN 3.1*   .Marland Kitchen Vitals:   05/27/23 0941  BP: 132/81  Pulse: 95  Temp: 97.9 F (36.6 C)  SpO2: 96%     Assessment and Plan:  Jordan [name] a 77 year old male. A ct scan on his abdomen pelvis revealed diffuse liver metastases  , Dr Modena Nunnery consulted IR for liver biopsy in IR.. Images were reviewed and approved by Dr Fredia Sorrow for biopsy today.  The patient has been NPO since midnight , In no apparent distress, afebrile, VSS, with wife and minister at bedside. Hgb of 15.0, WBC of 13.8 today, PT/INR pending at this time.   Risks and  benefits discussed with the patient including, but not limited to bleeding, infection, damage to adjacent structures or low yield requiring additional tests. All of the patient's questions were answered, patient is agreeable to proceed. Consent signed and in chart.  Thank you for this interesting consult.  [name] greatly enjoyed meeting Jordan [name] and look forward to participating in their care.  A copy of this report was sent to the requesting provider on this date.  Electronically Signed: Ardith Dark, NP 05/27/2023, 9:44 AM   [name] spent a total of  15 Minutes   in face to face in clinical consultation, greater than 50% of which was counseling/coordinating care for Percutaneous liver mass biopsy.

## 2023-05-27 NOTE — Progress Notes (Signed)
Patient clinically stable post US Liver biopsy per Dr Fredia Sorrow, tolerated well. Vitals stable pre and post procedure. Denies complaints of pain immediately post procedure. Received Versed 1 mg along with Fentanyl  50 mcg IV for procedure. Report given to [facility] RN post procedure./18.

## 2023-05-28 LAB — SURGICAL PATHOLOGY

## 2023-05-28 NOTE — Progress Notes (Unsigned)
Referring Physician:  Marina Goodell, MD [address] [place],  [place] [address]  Primary Physician:  Marina Goodell, MD  History of Present Illness: Jordan [name] has a history of CAD, cardiac cath, hyperlipidemia, DM, HTN, history of duodenal ulcer, GERD.   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   Last seen by me on 04/20/23 for constant LBP and bilateral anterior leg pain to his feet.   Fusion at T11-T12 looked good on xray. He has known severe lumbar spondylosis and DDD.   He is undergoing workup for multiple liver masses that were felt to be metastatic disease. Was started on ultram for pain. He is not eating for last 3-4 weeks and has lost weight. He does not feel well.   He is here to review his lumbar MRI.   He continues with constant LBP with bilateral anterior leg pain to his feet. His legs feel tired and heavy- this is worse with walking.  He has numbness in both feet. Pain is worse with walking and prolonged sitting. He notes some weakness in his legs. Symptoms in his legs are worse than his LBP.   He was given ultram by oncology- has not taken it much. He is using a walker now (was using a cane last visit).   Bowel/Bladder Dysfunction: none, having urinary frequency since recent UTI. No perineal numbness.   Conservative measures:  Physical therapy: no recent Multimodal medical therapy including regular antiinflammatories: zanaflex  Injections: No recent epidural steroid injections  Past Surgery:  T11-T12 posterior fusion on 08/09/19 by Dr. Wonda Amis has no symptoms of cervical myelopathy. He has some balance issues that started when his feet became numb.   The symptoms are causing a significant impact on the patient's life.   Review of Systems:  A 10 point review of systems is negative, except for the pertinent positives and negatives detailed in the HPI.  Past Medical History: Past Medical  History:  Diagnosis Date   Anemia    Arthritis    Coronary artery disease    Diabetes mellitus without complication (HCC)    Diverticulosis    Duodenal ulcer    Erectile dysfunction    GERD (gastroesophageal reflux disease)    Hyperlipidemia    Hypertension     Past Surgical History: Past Surgical History:  Procedure Laterality Date   CARDIAC CATHETERIZATION     COLONOSCOPY WITH PROPOFOL N/A 05/31/2017   Procedure: COLONOSCOPY WITH PROPOFOL;  Surgeon: Scot Jun, MD;  Location: [facility] ENDOSCOPY;  Service: Endoscopy;  Laterality: N/A;   JOINT REPLACEMENT Left    knee   THORACIC DISCECTOMY N/A 08/09/2019   Procedure: T11-12 POSTERIOR FUSION WITH TRANSPEDICULAR DECOMPRESSION;  Surgeon: Venetia Night, MD;  Location: [facility] ORS;  Service: Neurosurgery;  Laterality: N/A;   VASECTOMY      Allergies: Allergies as of 05/31/2023 - Review Complete 05/27/2023  Allergen Reaction Noted   Actos [pioglitazone] Hives 05/28/2017   Amoxicillin Hives and Rash 05/28/2017    Medications: Outpatient Encounter Medications as of 05/31/2023  Medication Sig   amLODipine (NORVASC) 10 MG tablet Take 10 mg by mouth daily.   ascorbic acid (VITAMIN C) 1000 MG tablet Take 1,000 mg by mouth daily.   atorvastatin (LIPITOR) 20 MG tablet Take 20 mg by mouth daily.   carvedilol (COREG) 3.125 MG tablet Take 3.125 mg by mouth 2 (two) times daily. (Patient not taking: Reported on 05/27/2023)  cholecalciferol (VITAMIN D) 25 MCG (1000 UT) tablet Take 1,000 Units by mouth daily.   CINNAMON PO Take 1,000 mg by mouth daily.    ciprofloxacin (CIPRO) 500 MG tablet Take 1 tablet (500 mg total) by mouth 2 (two) times daily.   gabapentin (NEURONTIN) 100 MG capsule Take 300 mg by mouth at bedtime. (Patient not taking: Reported on 05/27/2023)   glipiZIDE (GLUCOTROL) 5 MG tablet Take 5 mg by mouth daily.   hydrochlorothiazide (HYDRODIURIL) 25 MG tablet Take 25 mg by mouth daily.   magnesium oxide (MAG-OX) 400 MG  tablet Take 400 mg by mouth daily.   meloxicam (MOBIC) 15 MG tablet Take 1 tablet (15 mg total) by mouth daily. Take with food.   metFORMIN (GLUCOPHAGE) 1000 MG tablet Take 1,000 mg by mouth 2 (two) times daily with a meal.   omega-3 acid ethyl esters (LOVAZA) 1 g capsule Take 1 capsule by mouth 2 (two) times daily.   ramipril (ALTACE) 10 MG capsule Take 10 mg by mouth 2 (two) times daily.    Semaglutide, 2 MG/DOSE, 8 MG/3ML SOPN Inject into the skin.   traMADol (ULTRAM) 50 MG tablet Take 1 tablet (50 mg total) by mouth every 8 (eight) hours as needed.   vitamin B-12 (CYANOCOBALAMIN) 1000 MCG tablet Take 1,000 mcg by mouth daily.   No facility-administered encounter medications on file as of 05/31/2023.    Social History: Social History   Tobacco Use   Smoking status: Former    Current packs/day: 0.00    Types: Cigarettes    Quit date: 11/02/1987    Years since quitting: 35.5   Smokeless tobacco: Never  Vaping Use   Vaping status: Never Used  Substance Use Topics   Alcohol use: No   Drug use: No    Family Medical History: Family History  Problem Relation Age of Onset   Hypertension Mother     Physical Examination: There were no vitals filed for this visit.    Awake, alert, oriented to person, place, and time.  Speech is clear and fluent. Fund of knowledge is appropriate.   Cranial Nerves: Pupils equal round and reactive to light.  Facial tone is symmetric.    No abnormal lesions on exposed skin.   Strength: Side Biceps Triceps Deltoid Interossei Grip Wrist Ext. Wrist Flex.  R 5 5 5 5 5 5 5   L 5 5 5 5 5 5 5    Side Iliopsoas Quads Hamstring PF DF EHL  R 4+ 5 5 5 5 5   L 4+ 5 5 5 5 5    He has pain with above strength testing of lower extremities.   Reflexes are 2+ and symmetric at the biceps, brachioradialis, patella and achilles.   Hoffman's is absent.  Clonus is not present.   Bilateral upper and lower extremity sensation is intact to light touch.     He walks  with walker. Wide based gait.   Medical Decision Making  Imaging: Lumbar MRI dated 04/22/23:  FINDINGS: Segmentation:  Standard.   Alignment:  Physiologic.   Vertebrae: No acute fracture, evidence of discitis, or aggressive bone lesion.   Conus medullaris and cauda equina: Mild myelomalacia of the lower thoracic spinal cord at the level of T11-12 partially visualized. Conus extends to the L1-2 level. Conus and cauda equina appear normal.   Paraspinal and other soft tissues: No acute paraspinal abnormality.   Disc levels:   Disc spaces: Posterior lumbar fusion at T11-12. Degenerative disease with disc height loss at  T12-L1, L1-2, L2-3, L3-4, L4-5 and L5-S1. reactive endplate edema on either side of the L1-2 disc space.   T12-L1: Broad-based disc bulge. Moderate left facet arthropathy. No right facet arthropathy. No right foraminal stenosis. Moderate left foraminal stenosis.   L1-L2: Broad-based disc bulge with a right paracentral disc protrusion. Right subarticular recess stenosis. Moderate bilateral facet arthropathy. Moderate spinal stenosis. No left foraminal stenosis. Mild right foraminal stenosis.   L2-L3: Broad-based disc osteophyte complex. Moderate bilateral facet arthropathy. Bilateral subarticular recess stenosis. Moderate spinal stenosis. Mild right foraminal stenosis. No left foraminal stenosis.   L3-L4: Broad-based disc osteophyte complex. Moderate bilateral facet arthropathy. Moderate spinal stenosis. No left foraminal stenosis. Mild right foraminal stenosis.   L4-L5: Broad-based disc osteophyte complex. Moderate bilateral facet arthropathy. Severe spinal stenosis. Moderate right foraminal stenosis. Moderate left foraminal stenosis.   L5-S1: Broad-based disc osteophyte complex. Moderate bilateral facet arthropathy with a small left posterior synovial cyst. Severe left foraminal stenosis. Moderate right foraminal stenosis. Bilateral subarticular recess  stenosis.   IMPRESSION: 1. Diffuse lumbar spine spondylosis as described above. 2. No acute osseous injury of the lumbar spine.     Electronically Signed   By: Elige Ko M.D.   On: 05/12/2023 09:41    I have personally reviewed the images and agree with the above interpretation.  Assessment and Plan: Jordan [name] is a pleasant 77 y.o. male who has a history of T11-T12 posterior fusion on 08/09/19 by Dr. Myer Haff. He did well postop.   He continues with constant LBP with bilateral anterior leg pain to his feet. His legs feel tired and heavy- this is worse with walking. He also has numbness in both feet. Leg symptoms are his primary complaint.   Fusion at T11-T12 looked good on xray. He has known severe lumbar spondylosis and DDD. He has moderate central stenosis L1-L4 with severe central stenosis L4-L5 along with multilevel foraminal stenosis.   LBP is likely due to underlying DDD/spondylosis. Symptoms in legs are likely from spinal stenosis.   Treatment options discussed with patient and following plan made:   - Referral to pain management (Lateef) for lumbar injections.  - As above, he is not eating much and continues with urinary frequency after being treated for UTI. Message to PCP about this.  - He is being worked up for liver mass and has f/u with oncology tomorrow.  - Discussed that he has option to go to ED if he is not doing well at home.  - He can take ultram as needed from oncology for pain.,  - His balance issues are likely from numbness in feet. No other signs of myelopathy on exam. May consider updated thoracic MRI at some point. May also consider EMG/NCS as well.  - Will follow up with chart review after his visit with oncology to discuss follow up.   I spent a total of 30 minutes in face-to-face and non-face-to-face activities related to this patient's care today including review of outside records, review of imaging, review of symptoms, physical exam, discussion of  differential diagnosis, discussion of treatment options, and documentation.   Drake Leach PA-C Dept. of Neurosurgery

## 2023-05-31 ENCOUNTER — Encounter: Payer: Self-pay | Admitting: Emergency Medicine

## 2023-05-31 ENCOUNTER — Encounter: Payer: Self-pay | Admitting: Orthopedic Surgery

## 2023-05-31 ENCOUNTER — Emergency Department
Admission: EM | Admit: 2023-05-31 | Discharge: 2023-05-31 | Disposition: A | Discharge status: Home / Self Care | Payer: Medicare HMO | Attending: Emergency Medicine | Admitting: Emergency Medicine

## 2023-05-31 ENCOUNTER — Emergency Department: Payer: Medicare HMO

## 2023-05-31 ENCOUNTER — Other Ambulatory Visit: Payer: Self-pay

## 2023-05-31 ENCOUNTER — Ambulatory Visit: Payer: Medicare HMO | Admitting: Orthopedic Surgery

## 2023-05-31 VITALS — BP 112/60 | Ht 71.0 in | Wt 227.4 lb

## 2023-05-31 DIAGNOSIS — E119 Type 2 diabetes mellitus without complications: Secondary | ICD-10-CM | POA: Diagnosis not present

## 2023-05-31 DIAGNOSIS — R110 Nausea: Secondary | ICD-10-CM | POA: Diagnosis not present

## 2023-05-31 DIAGNOSIS — M47816 Spondylosis without myelopathy or radiculopathy, lumbar region: Secondary | ICD-10-CM

## 2023-05-31 DIAGNOSIS — I2510 Atherosclerotic heart disease of native coronary artery without angina pectoris: Secondary | ICD-10-CM | POA: Diagnosis not present

## 2023-05-31 DIAGNOSIS — R160 Hepatomegaly, not elsewhere classified: Secondary | ICD-10-CM

## 2023-05-31 DIAGNOSIS — K5900 Constipation, unspecified: Secondary | ICD-10-CM | POA: Insufficient documentation

## 2023-05-31 DIAGNOSIS — R188 Other ascites: Secondary | ICD-10-CM | POA: Diagnosis not present

## 2023-05-31 DIAGNOSIS — N3000 Acute cystitis without hematuria: Secondary | ICD-10-CM | POA: Diagnosis not present

## 2023-05-31 DIAGNOSIS — M48061 Spinal stenosis, lumbar region without neurogenic claudication: Secondary | ICD-10-CM | POA: Diagnosis not present

## 2023-05-31 DIAGNOSIS — K5730 Diverticulosis of large intestine without perforation or abscess without bleeding: Secondary | ICD-10-CM | POA: Diagnosis not present

## 2023-05-31 DIAGNOSIS — R350 Frequency of micturition: Secondary | ICD-10-CM

## 2023-05-31 DIAGNOSIS — C787 Secondary malignant neoplasm of liver and intrahepatic bile duct: Secondary | ICD-10-CM | POA: Diagnosis not present

## 2023-05-31 DIAGNOSIS — I10 Essential (primary) hypertension: Secondary | ICD-10-CM | POA: Insufficient documentation

## 2023-05-31 DIAGNOSIS — R1011 Right upper quadrant pain: Secondary | ICD-10-CM

## 2023-05-31 DIAGNOSIS — M51362 Other intervertebral disc degeneration, lumbar region with discogenic back pain and lower extremity pain: Secondary | ICD-10-CM | POA: Diagnosis not present

## 2023-05-31 DIAGNOSIS — K7689 Other specified diseases of liver: Secondary | ICD-10-CM | POA: Insufficient documentation

## 2023-05-31 LAB — BASIC METABOLIC PANEL
Anion gap: 14 (ref 5–15)
BUN: 37 mg/dL — ABNORMAL HIGH (ref 8–23)
CO2: 23 mmol/L (ref 22–32)
Calcium: 8.3 mg/dL — ABNORMAL LOW (ref 8.9–10.3)
Chloride: 91 mmol/L — ABNORMAL LOW (ref 98–111)
Creatinine, Ser: 1.89 mg/dL — ABNORMAL HIGH (ref 0.61–1.24)
GFR, Estimated: 36 mL/min — ABNORMAL LOW (ref >60)
Glucose, Bld: 182 mg/dL — ABNORMAL HIGH (ref 70–99)
Potassium: 5.3 mmol/L — ABNORMAL HIGH (ref 3.5–5.1)
Sodium: 128 mmol/L — ABNORMAL LOW (ref 135–145)

## 2023-05-31 LAB — CBC
HCT: 42.3 % (ref 39.0–52.0)
Hemoglobin: 14.5 g/dL (ref 13.0–17.0)
MCH: 27.3 pg (ref 26.0–34.0)
MCHC: 34.3 g/dL (ref 30.0–36.0)
MCV: 79.5 fL — ABNORMAL LOW (ref 80.0–100.0)
Platelets: 422 10*3/uL — ABNORMAL HIGH (ref 150–400)
RBC: 5.32 MIL/uL (ref 4.22–5.81)
RDW: 18.3 % — ABNORMAL HIGH (ref 11.5–15.5)
WBC: 17.6 10*3/uL — ABNORMAL HIGH (ref 4.0–10.5)
nRBC: 0 % (ref 0.0–0.2)

## 2023-05-31 LAB — URINALYSIS, ROUTINE W REFLEX MICROSCOPIC
Glucose, UA: NEGATIVE mg/dL
Ketones, ur: NEGATIVE mg/dL
Nitrite: NEGATIVE
Protein, ur: 30 mg/dL — AB
Specific Gravity, Urine: 1.024 (ref 1.005–1.030)
pH: 5 (ref 5.0–8.0)

## 2023-05-31 MED ORDER — CEPHALEXIN 500 MG PO CAPS: 500.0000 mg | ORAL_CAPSULE | Freq: Three times a day (TID) | ORAL | 0 refills | Status: AC

## 2023-05-31 MED ORDER — CEPHALEXIN 500 MG PO CAPS
500.0000 mg | ORAL_CAPSULE | Freq: Once | ORAL | Status: AC
  Administered 2023-05-31: 500 mg via ORAL
  Filled 2023-05-31: qty 1

## 2023-05-31 MED ORDER — ONDANSETRON HCL 4 MG/2ML IJ SOLN
4.0000 mg | Freq: Once | INTRAMUSCULAR | Status: AC
  Administered 2023-05-31: 4 mg via INTRAVENOUS
  Filled 2023-05-31: qty 2

## 2023-05-31 MED ORDER — SODIUM CHLORIDE 0.9 % IV BOLUS
1000.0000 mL | Freq: Once | INTRAVENOUS | Status: AC
  Administered 2023-05-31: 1000 mL via INTRAVENOUS

## 2023-05-31 NOTE — Patient Instructions (Signed)
It was so nice to see you today. Thank you so much for coming in.    You have a lot of wear and tear in your lower back (arthritis) along with spinal stenosis. I think this is causing the pain in your back and legs.   I want you to see pain management here in [place] (Dr. Cherylann Ratel) to discuss possible lumbar injections. They should call you to schedule an appointment or you can call them at [phone].   Follow up with oncology tomorrow as scheduled. Make sure you tell them that you are still having frequent urination.   I will send a message to your PCP and let him know what is going on as well.   You always have the option of going to the emergency room if you are feeling poorly and not getting around well at home.   I will check your chart after your visit tomorrow and we can make a plan for follow up.   Please call me if you need anything.   Drake Leach PA-C [phone]     The physicians and staff at [facility] at [facility] are committed to providing excellent care. You may receive a survey asking for feedback about your experience at our office. We value you your feedback and appreciate you taking the time to to fill it out. The [facility] leadership team is also available to discuss your experience in person, feel free to contact us [phone].

## 2023-05-31 NOTE — ED Notes (Signed)
Pt verbalizes understanding of discharge instructions. Opportunity for questioning and answers were provided. Pt discharged from ED to home with wife.    

## 2023-05-31 NOTE — ED Notes (Signed)
Pt gone to CT 

## 2023-05-31 NOTE — ED Triage Notes (Signed)
Patient to ED via POV for generalized weakness x3 weeks. Dx with a UTI on 10/11- took full dose of medication. Having nausea and unable to eat much. Also having bilateral leg weakness- has known herniated disc. Had 20 lb weight loss over the last few weeks. Also, had liver biopsy on Thursday. Wife reports going to the bathroom every hour at night.

## 2023-05-31 NOTE — ED Provider Notes (Signed)
Care of this patient assumed from prior physician at 1600 pending CT scan, urinalysis, and disposition. Please see prior physician note for further details.  Briefly this is a 77 year old male with known liver mass s/p recent biopsy presenting with right upper quadrant pain and weakness.  Labs with hyponatremia with sodium 128, slight AKI with creatinine of 1.89, received 1 L of IV fluids. Elevated WBC at 17.6 but does not meet SIRS criteria. Urine improved from prior, but remains concerning for infection with trace leukocyte esterase, 21-50 white blood cells, rare bacteria on a clean sample. CT demonstrates worsening of known liver masses with small volume ascites, no visible hematoma, abscess. Multiple other previously noted findings demonstrated.  Patient was reevaluated and updated on the results of his workup.  He has outpatient follow-up arranged for further discussion of his biopsy results.  With his questionable urine, will send urine culture.  Most recent urine culture was pansensitive, appears he was placed on ciprofloxacin.  Will trial Keflex with first dose in the emergency department.  Patient is comfortable with this plan.  Strict return precautions provided.  Patient discharged in stable condition.   Trinna Post, MD 05/31/23 [phone]

## 2023-05-31 NOTE — ED Provider Notes (Signed)
[facility] Provider Note    Event Date/Time   First MD Initiated Contact with Patient 05/31/23 1335     (approximate)   History   Weakness   HPI  Jordan [name] is a 77 y.o. male   Past medical history of diabetes, CAD, GERD, hypertension hyperlipidemia who had a liver biopsy for liver mass last week who presents to the emergency department with generalized weakness, fatigue, right upper quadrant pain, poor p.o. intake, nausea, constipation.  He also continues to have urinary symptoms of frequency and dysuria, dribbling urine even after completing antibiotic course for UTI earlier this month.  He has chronic lower back pain and chronic unchanged bilateral lower extremity weakness and numbness currently being worked up and spine clinic with an MRI last month unremarkable.    Independent Historian contributed to assessment above: His family members at bedside to corroborate information given above  External Medical Documents Reviewed: MRI of the lumbar spine without contrast obtained in September 2024 with no signs of spinal cord compression      Physical Exam   Triage Vital Signs: ED Triage Vitals  Encounter Vitals Group     BP 05/31/23 1224 130/76     Systolic BP Percentile --      Diastolic BP Percentile --      Pulse Rate 05/31/23 1224 89     Resp 05/31/23 1224 18     Temp 05/31/23 1227 98.6 F (37 C)     Temp Source 05/31/23 1227 Axillary     SpO2 05/31/23 1224 96 %     Weight 05/31/23 1224 227 lb (103 kg)     Height 05/31/23 1224 5\' 11"  (1.803 m)     Head Circumference --      Peak Flow --      Pain Score --      Pain Loc --      Pain Education --      Exclude from Growth Chart --     Most recent vital signs: Vitals:   05/31/23 1430 05/31/23 1500  BP: (!) 140/74 (!) 145/72  Pulse: 86 85  Resp: 17 18  Temp:    SpO2: 99% 96%    General: Awake, no distress.  CV:  Good peripheral perfusion.  Resp:  Normal effort.  Abd:  No  distention.  Other:  Right upper quadrant tenderness to palpation without rigidity or guarding, slightly hypertensive otherwise vital signs normal.  The remainder of abdominal exam shows no tenderness in the other quadrants.  He is able to range both lower extremities and is sensate.  His lung sounds are normal.   ED Results / Procedures / Treatments   Labs (all labs ordered are listed, but only abnormal results are displayed) Labs Reviewed  BASIC METABOLIC PANEL - Abnormal; Notable for the following components:      Result Value   Sodium 128 (*)    Potassium 5.3 (*)    Chloride 91 (*)    Glucose, Bld 182 (*)    BUN 37 (*)    Creatinine, Ser 1.89 (*)    Calcium 8.3 (*)    GFR, Estimated 36 (*)    All other components within normal limits  CBC - Abnormal; Notable for the following components:   WBC 17.6 (*)    MCV 79.5 (*)    RDW 18.3 (*)    Platelets 422 (*)    All other components within normal limits  URINALYSIS, ROUTINE W REFLEX MICROSCOPIC  CBG MONITORING, ED     I ordered and reviewed the above labs they are notable for his creatinine is slightly elevated from prior, currently at 1.89.  His white blood cell count is chronically elevated today is 17.6.  EKG  ED ECG REPORT I, Pilar Jarvis, the attending physician, personally viewed and interpreted this ECG.   Date: 05/31/2023  EKG Time: 1240  Rate: 94  Rhythm: sinus  Axis:   Intervals:nl  ST&T Change: no stemi    RADIOLOGY I independently reviewed and interpreted CT of the abdomen pelvis and see multiple focal lesions in the liver consistent with known liver masses I also reviewed radiologist's formal read.   PROCEDURES:  Critical Care performed: No  Procedures   MEDICATIONS ORDERED IN ED: Medications  sodium chloride 0.9 % bolus 1,000 mL (1,000 mLs Intravenous New Bag/Given 05/31/23 1506)  ondansetron (ZOFRAN) injection 4 mg (4 mg Intravenous Given 05/31/23 1507)     IMPRESSION / MDM / ASSESSMENT  AND PLAN / ED COURSE  I reviewed the triage vital signs and the nursing notes.                                Patient's presentation is most consistent with acute presentation with potential threat to life or bodily function.  Differential diagnosis includes, but is not limited to, urinary tract infection, pain due to liver masses, hematoma in the liver, liver infection/abscess, obstruction   The patient is on the cardiac monitor to evaluate for evidence of arrhythmia and/or significant heart rate changes.  MDM:    Urinary symptoms could be a sign of recurrent urinary tract infection, get urinalysis.  Poor p.o. intake, nausea, constipated, check for obstruction with CT scan, also CT scan to evaluate right upper quadrant pain that is worsened after his liver biopsy for any postbiopsy complications like hematoma or abscess.  Patient remains stable during hospital stay thus far, disposition pending workup as above.  If negative, his lower urinary tract symptoms may be due to BPH, and his ongoing right upper quadrant discomfort and ongoing weight loss, nausea may be due to what ever his underlying these liver masses for which he will await the findings of his biopsy and treatment with his primary care team.      FINAL CLINICAL IMPRESSION(S) / ED DIAGNOSES   Final diagnoses:  Urinary frequency  Liver masses  Nausea  RUQ pain     Rx / DC Orders   ED Discharge Orders     None        Note:  This document was prepared using Dragon voice recognition software and may include unintentional dictation errors.    Pilar Jarvis, MD 05/31/23 [phone]

## 2023-05-31 NOTE — ED Notes (Signed)
Pt unable to give a urine sample at this time.

## 2023-05-31 NOTE — Discharge Instructions (Addendum)
You were seen in the Emergency Department today for evaluation of your abdominal pain and urinary frequency.  Your testing showed that you are likely dehydrated, you were given some IV fluids.  Your CT scan showed the known masses in your liver, no complication from your biopsy was seen.  Your urine still looks concerning for an infection.  I sent a prescription for an antibiotic to your pharmacy.  Please take this as directed.  If your urine culture shows that this is not an appropriate antibiotic you will be contacted.  Please continue to follow-up closely with your outpatient doctors.  Return to the ER for any new or worsening symptoms.

## 2023-06-01 ENCOUNTER — Inpatient Hospital Stay: Payer: Medicare HMO | Admitting: Internal Medicine

## 2023-06-01 ENCOUNTER — Telehealth: Payer: Self-pay

## 2023-06-01 ENCOUNTER — Inpatient Hospital Stay: Payer: Medicare HMO

## 2023-06-01 VITALS — BP 119/65 | HR 90 | Temp 98.6°F | Wt 235.0 lb

## 2023-06-01 DIAGNOSIS — K769 Liver disease, unspecified: Secondary | ICD-10-CM | POA: Diagnosis not present

## 2023-06-01 DIAGNOSIS — C801 Malignant (primary) neoplasm, unspecified: Secondary | ICD-10-CM

## 2023-06-01 DIAGNOSIS — C799 Secondary malignant neoplasm of unspecified site: Secondary | ICD-10-CM

## 2023-06-01 DIAGNOSIS — E119 Type 2 diabetes mellitus without complications: Secondary | ICD-10-CM | POA: Diagnosis not present

## 2023-06-01 DIAGNOSIS — C787 Secondary malignant neoplasm of liver and intrahepatic bile duct: Secondary | ICD-10-CM

## 2023-06-01 DIAGNOSIS — R1310 Dysphagia, unspecified: Secondary | ICD-10-CM | POA: Diagnosis not present

## 2023-06-01 DIAGNOSIS — R7401 Elevation of levels of liver transaminase levels: Secondary | ICD-10-CM

## 2023-06-01 DIAGNOSIS — G893 Neoplasm related pain (acute) (chronic): Secondary | ICD-10-CM

## 2023-06-01 DIAGNOSIS — R0602 Shortness of breath: Secondary | ICD-10-CM | POA: Diagnosis not present

## 2023-06-01 DIAGNOSIS — Z87891 Personal history of nicotine dependence: Secondary | ICD-10-CM | POA: Diagnosis not present

## 2023-06-01 DIAGNOSIS — M549 Dorsalgia, unspecified: Secondary | ICD-10-CM | POA: Diagnosis not present

## 2023-06-01 LAB — CBC WITH DIFFERENTIAL/PLATELET
Abs Immature Granulocytes: 0.13 10*3/uL — ABNORMAL HIGH (ref 0.00–0.07)
Basophils Absolute: 0.1 10*3/uL (ref 0.0–0.1)
Basophils Relative: 0 %
Eosinophils Absolute: 0 10*3/uL (ref 0.0–0.5)
Eosinophils Relative: 0 %
HCT: 42.7 % (ref 39.0–52.0)
Hemoglobin: 14.4 g/dL (ref 13.0–17.0)
Immature Granulocytes: 1 %
Lymphocytes Relative: 5 %
Lymphs Abs: 0.9 10*3/uL (ref 0.7–4.0)
MCH: 27.2 pg (ref 26.0–34.0)
MCHC: 33.7 g/dL (ref 30.0–36.0)
MCV: 80.6 fL (ref 80.0–100.0)
Monocytes Absolute: 1.6 10*3/uL — ABNORMAL HIGH (ref 0.1–1.0)
Monocytes Relative: 9 %
Neutro Abs: 14.6 10*3/uL — ABNORMAL HIGH (ref 1.7–7.7)
Neutrophils Relative %: 85 %
Platelets: 420 10*3/uL — ABNORMAL HIGH (ref 150–400)
RBC: 5.3 MIL/uL (ref 4.22–5.81)
RDW: 18.8 % — ABNORMAL HIGH (ref 11.5–15.5)
WBC: 17.2 10*3/uL — ABNORMAL HIGH (ref 4.0–10.5)
nRBC: 0 % (ref 0.0–0.2)

## 2023-06-01 LAB — CMP (CANCER CENTER ONLY)
ALT: 125 U/L — ABNORMAL HIGH (ref 0–44)
AST: 293 U/L (ref 15–41)
Albumin: 2.4 g/dL — ABNORMAL LOW (ref 3.5–5.0)
Alkaline Phosphatase: 820 U/L — ABNORMAL HIGH (ref 38–126)
Anion gap: 16 — ABNORMAL HIGH (ref 5–15)
BUN: 45 mg/dL — ABNORMAL HIGH (ref 8–23)
CO2: 21 mmol/L — ABNORMAL LOW (ref 22–32)
Calcium: 8.5 mg/dL — ABNORMAL LOW (ref 8.9–10.3)
Chloride: 92 mmol/L — ABNORMAL LOW (ref 98–111)
Creatinine: 2.05 mg/dL — ABNORMAL HIGH (ref 0.61–1.24)
GFR, Estimated: 33 mL/min — ABNORMAL LOW (ref >60)
Glucose, Bld: 151 mg/dL — ABNORMAL HIGH (ref 70–99)
Potassium: 5.1 mmol/L (ref 3.5–5.1)
Sodium: 129 mmol/L — ABNORMAL LOW (ref 135–145)
Total Bilirubin: 5.2 mg/dL (ref 0.3–1.2)
Total Protein: 6.4 g/dL — ABNORMAL LOW (ref 6.5–8.1)

## 2023-06-01 MED ORDER — TRAMADOL HCL 50 MG PO TABS: 50.0000 mg | ORAL_TABLET | Freq: Three times a day (TID) | ORAL | 0 refills | Status: DC | PRN

## 2023-06-01 NOTE — Progress Notes (Unsigned)
Patient was in the emergency room yesterday. While he was in the hospital he got fluids and antibiotics for his UTI.

## 2023-06-01 NOTE — Progress Notes (Unsigned)
[facility] requested on specimen [id number], liver, collected 05/27/23

## 2023-06-01 NOTE — Telephone Encounter (Signed)
Faxed [facility] the order for a port placement.

## 2023-06-02 ENCOUNTER — Other Ambulatory Visit: Payer: Self-pay

## 2023-06-02 ENCOUNTER — Telehealth: Payer: Self-pay

## 2023-06-02 DIAGNOSIS — R748 Abnormal levels of other serum enzymes: Secondary | ICD-10-CM

## 2023-06-02 DIAGNOSIS — C799 Secondary malignant neoplasm of unspecified site: Secondary | ICD-10-CM

## 2023-06-02 LAB — URINE CULTURE: Culture: NO GROWTH

## 2023-06-02 NOTE — Telephone Encounter (Signed)
Per Dr. Alena Bills LFT and specifically bilirubin continue to go up. I think we should also get MRCP to rule out any biliary obstruction. Contacted Mr. Leal and notified. Order placed for MRCP and scheduling notified.

## 2023-06-02 NOTE — Telephone Encounter (Signed)
Called patient to try to schedule port placement for Friday. Wife informed me that patient would like to wait on port until after PET on 11/4.

## 2023-06-03 ENCOUNTER — Other Ambulatory Visit: Payer: Self-pay | Admitting: Radiology

## 2023-06-03 ENCOUNTER — Ambulatory Visit
Admission: RE | Admit: 2023-06-03 | Discharge: 2023-06-03 | Disposition: A | Discharge status: Home / Self Care | Payer: Medicare HMO | Source: Ambulatory Visit | Attending: Internal Medicine | Admitting: Internal Medicine

## 2023-06-03 ENCOUNTER — Other Ambulatory Visit: Payer: Self-pay | Admitting: Internal Medicine

## 2023-06-03 ENCOUNTER — Other Ambulatory Visit: Payer: Self-pay

## 2023-06-03 DIAGNOSIS — I10 Essential (primary) hypertension: Secondary | ICD-10-CM | POA: Diagnosis not present

## 2023-06-03 DIAGNOSIS — I499 Cardiac arrhythmia, unspecified: Secondary | ICD-10-CM | POA: Diagnosis not present

## 2023-06-03 DIAGNOSIS — C787 Secondary malignant neoplasm of liver and intrahepatic bile duct: Secondary | ICD-10-CM | POA: Diagnosis not present

## 2023-06-03 DIAGNOSIS — R109 Unspecified abdominal pain: Secondary | ICD-10-CM | POA: Diagnosis not present

## 2023-06-03 DIAGNOSIS — I25110 Atherosclerotic heart disease of native coronary artery with unstable angina pectoris: Secondary | ICD-10-CM | POA: Diagnosis not present

## 2023-06-03 DIAGNOSIS — C801 Malignant (primary) neoplasm, unspecified: Secondary | ICD-10-CM | POA: Diagnosis not present

## 2023-06-03 DIAGNOSIS — R918 Other nonspecific abnormal finding of lung field: Secondary | ICD-10-CM | POA: Diagnosis not present

## 2023-06-03 DIAGNOSIS — E875 Hyperkalemia: Secondary | ICD-10-CM | POA: Diagnosis present

## 2023-06-03 DIAGNOSIS — Z515 Encounter for palliative care: Secondary | ICD-10-CM | POA: Diagnosis not present

## 2023-06-03 DIAGNOSIS — G893 Neoplasm related pain (acute) (chronic): Secondary | ICD-10-CM | POA: Insufficient documentation

## 2023-06-03 DIAGNOSIS — K820 Obstruction of gallbladder: Secondary | ICD-10-CM | POA: Diagnosis not present

## 2023-06-03 DIAGNOSIS — E871 Hypo-osmolality and hyponatremia: Secondary | ICD-10-CM | POA: Diagnosis present

## 2023-06-03 DIAGNOSIS — R000 Tachycardia, unspecified: Secondary | ICD-10-CM | POA: Diagnosis not present

## 2023-06-03 DIAGNOSIS — N390 Urinary tract infection, site not specified: Secondary | ICD-10-CM | POA: Diagnosis not present

## 2023-06-03 DIAGNOSIS — E785 Hyperlipidemia, unspecified: Secondary | ICD-10-CM | POA: Diagnosis not present

## 2023-06-03 DIAGNOSIS — C779 Secondary and unspecified malignant neoplasm of lymph node, unspecified: Secondary | ICD-10-CM | POA: Diagnosis present

## 2023-06-03 DIAGNOSIS — R932 Abnormal findings on diagnostic imaging of liver and biliary tract: Secondary | ICD-10-CM | POA: Diagnosis not present

## 2023-06-03 DIAGNOSIS — N1831 Chronic kidney disease, stage 3a: Secondary | ICD-10-CM | POA: Diagnosis present

## 2023-06-03 DIAGNOSIS — R6521 Severe sepsis with septic shock: Secondary | ICD-10-CM | POA: Diagnosis not present

## 2023-06-03 DIAGNOSIS — E669 Obesity, unspecified: Secondary | ICD-10-CM | POA: Diagnosis present

## 2023-06-03 DIAGNOSIS — I129 Hypertensive chronic kidney disease with stage 1 through stage 4 chronic kidney disease, or unspecified chronic kidney disease: Secondary | ICD-10-CM | POA: Diagnosis present

## 2023-06-03 DIAGNOSIS — C799 Secondary malignant neoplasm of unspecified site: Secondary | ICD-10-CM

## 2023-06-03 DIAGNOSIS — R0600 Dyspnea, unspecified: Secondary | ICD-10-CM | POA: Diagnosis not present

## 2023-06-03 DIAGNOSIS — Z66 Do not resuscitate: Secondary | ICD-10-CM | POA: Diagnosis not present

## 2023-06-03 DIAGNOSIS — I4891 Unspecified atrial fibrillation: Secondary | ICD-10-CM | POA: Diagnosis not present

## 2023-06-03 DIAGNOSIS — K7200 Acute and subacute hepatic failure without coma: Secondary | ICD-10-CM | POA: Diagnosis not present

## 2023-06-03 DIAGNOSIS — R7989 Other specified abnormal findings of blood chemistry: Secondary | ICD-10-CM | POA: Diagnosis not present

## 2023-06-03 DIAGNOSIS — I700 Atherosclerosis of aorta: Secondary | ICD-10-CM | POA: Diagnosis not present

## 2023-06-03 DIAGNOSIS — R748 Abnormal levels of other serum enzymes: Secondary | ICD-10-CM | POA: Insufficient documentation

## 2023-06-03 DIAGNOSIS — I483 Typical atrial flutter: Secondary | ICD-10-CM | POA: Diagnosis not present

## 2023-06-03 DIAGNOSIS — C259 Malignant neoplasm of pancreas, unspecified: Secondary | ICD-10-CM | POA: Diagnosis present

## 2023-06-03 DIAGNOSIS — E872 Acidosis, unspecified: Secondary | ICD-10-CM | POA: Diagnosis not present

## 2023-06-03 DIAGNOSIS — I4892 Unspecified atrial flutter: Secondary | ICD-10-CM | POA: Diagnosis not present

## 2023-06-03 DIAGNOSIS — I480 Paroxysmal atrial fibrillation: Secondary | ICD-10-CM | POA: Diagnosis not present

## 2023-06-03 DIAGNOSIS — J9811 Atelectasis: Secondary | ICD-10-CM | POA: Diagnosis not present

## 2023-06-03 DIAGNOSIS — R600 Localized edema: Secondary | ICD-10-CM | POA: Diagnosis not present

## 2023-06-03 DIAGNOSIS — I959 Hypotension, unspecified: Secondary | ICD-10-CM | POA: Diagnosis not present

## 2023-06-03 DIAGNOSIS — N179 Acute kidney failure, unspecified: Secondary | ICD-10-CM | POA: Diagnosis present

## 2023-06-03 DIAGNOSIS — G9341 Metabolic encephalopathy: Secondary | ICD-10-CM | POA: Diagnosis not present

## 2023-06-03 DIAGNOSIS — K8020 Calculus of gallbladder without cholecystitis without obstruction: Secondary | ICD-10-CM | POA: Diagnosis not present

## 2023-06-03 DIAGNOSIS — D631 Anemia in chronic kidney disease: Secondary | ICD-10-CM | POA: Diagnosis present

## 2023-06-03 DIAGNOSIS — R6520 Severe sepsis without septic shock: Secondary | ICD-10-CM | POA: Diagnosis not present

## 2023-06-03 DIAGNOSIS — R7401 Elevation of levels of liver transaminase levels: Secondary | ICD-10-CM | POA: Insufficient documentation

## 2023-06-03 DIAGNOSIS — K8000 Calculus of gallbladder with acute cholecystitis without obstruction: Secondary | ICD-10-CM | POA: Diagnosis present

## 2023-06-03 DIAGNOSIS — R160 Hepatomegaly, not elsewhere classified: Secondary | ICD-10-CM | POA: Diagnosis not present

## 2023-06-03 DIAGNOSIS — R188 Other ascites: Secondary | ICD-10-CM | POA: Diagnosis not present

## 2023-06-03 DIAGNOSIS — E1122 Type 2 diabetes mellitus with diabetic chronic kidney disease: Secondary | ICD-10-CM | POA: Diagnosis present

## 2023-06-03 DIAGNOSIS — C7951 Secondary malignant neoplasm of bone: Secondary | ICD-10-CM | POA: Diagnosis not present

## 2023-06-03 DIAGNOSIS — A419 Sepsis, unspecified organism: Secondary | ICD-10-CM | POA: Diagnosis not present

## 2023-06-03 MED ORDER — GADOBUTROL 1 MMOL/ML IV SOLN
10.0000 mL | Freq: Once | INTRAVENOUS | Status: AC | PRN
Start: 2023-06-03 — End: 2023-06-03
  Administered 2023-06-03: 10 mL via INTRAVENOUS

## 2023-06-03 NOTE — Progress Notes (Signed)
[facility] CONSULT NOTE  Patient Care Team: Marina Goodell, MD as PCP - General (Family Medicine) Earna Coder, MD as Consulting Physician (Oncology) Benita Gutter, RN as Oncology Nurse Navigator  CHIEF COMPLAINTS/PURPOSE OF CONSULTATION: Abnormal scans.   Oncology History   No history exists.    HISTORY OF PRESENTING ILLNESS: Patient ambulating-with assistance- wheel chair.  /Accompanied by wife.   Jordan [name] 77 y.o.  male longstanding history of smoking DM- on OHA; no prior history of malignancy; diabetes seen today as follow-up to discuss liver biopsy results.  patient presented to ED on/10/28 with concerns for generalized weakness, fatigue, right upper quadrant abdominal pain and poor p.o. intake.  Also urinary symptoms.  He was diagnosed with UTI and discharged on antibiotics.  Repeat CT abdomen was performed which showed interval progression of the liver lesions.  He continues to feel fatigued and weak.  Having right upper quadrant abdominal pain.  Using tramadol.    Review of Systems  Constitutional:  Positive for malaise/fatigue and weight loss. Negative for chills, diaphoresis and fever.  HENT:  Negative for nosebleeds and sore throat.   Eyes:  Negative for double vision.  Respiratory:  Negative for cough, hemoptysis, sputum production, shortness of breath and wheezing.   Cardiovascular:  Negative for chest pain, palpitations, orthopnea and leg swelling.  Gastrointestinal:  Positive for abdominal pain and heartburn. Negative for blood in stool, constipation, diarrhea, melena and vomiting.  Genitourinary:  Negative for dysuria, frequency and urgency.  Musculoskeletal:  Negative for back pain and joint pain.  Skin: Negative.  Negative for itching and rash.  Neurological:  Negative for dizziness, tingling, focal weakness, weakness and headaches.  Endo/Heme/Allergies:  Does not bruise/bleed easily.  Psychiatric/Behavioral:  Negative for  depression. The patient is not nervous/anxious and does not have insomnia.     MEDICAL HISTORY:  Past Medical History:  Diagnosis Date   Anemia    Arthritis    Coronary artery disease    Diabetes mellitus without complication (HCC)    Diverticulosis    Duodenal ulcer    Erectile dysfunction    GERD (gastroesophageal reflux disease)    Hyperlipidemia    Hypertension     SURGICAL HISTORY: Past Surgical History:  Procedure Laterality Date   CARDIAC CATHETERIZATION     COLONOSCOPY WITH PROPOFOL N/A 05/31/2017   Procedure: COLONOSCOPY WITH PROPOFOL;  Surgeon: Scot Jun, MD;  Location: [facility] ENDOSCOPY;  Service: Endoscopy;  Laterality: N/A;   JOINT REPLACEMENT Left    knee   THORACIC DISCECTOMY N/A 08/09/2019   Procedure: T11-12 POSTERIOR FUSION WITH TRANSPEDICULAR DECOMPRESSION;  Surgeon: Venetia Night, MD;  Location: [facility] ORS;  Service: Neurosurgery;  Laterality: N/A;   VASECTOMY      SOCIAL HISTORY: Social History   Socioeconomic History   Marital status: Married    Spouse name: Not on file   Number of children: Not on file   Years of education: Not on file   Highest education level: Not on file  Occupational History   Not on file  Tobacco Use   Smoking status: Former    Current packs/day: 0.00    Types: Cigarettes    Quit date: 11/02/1987    Years since quitting: 35.6   Smokeless tobacco: Never  Vaping Use   Vaping status: Never Used  Substance and Sexual Activity   Alcohol use: No   Drug use: No   Sexual activity: Not on file  Other Topics Concern   Not on  file  Social History Narrative   Not on file   Social Determinants of Health   Financial Resource Strain: Not on file  Food Insecurity: Not on file  Transportation Needs: Not on file  Physical Activity: Unknown (01/11/2018)   Received from [facility]   Exercise Vital Sign    Days of Exercise per Week: 3 days    Minutes of Exercise per Session: Not on file  Stress: Not on  file  Social Connections: Not on file  Intimate Partner Violence: Not on file    FAMILY HISTORY: Family History  Problem Relation Age of Onset   Hypertension Mother     ALLERGIES:  is allergic to actos [pioglitazone] and amoxicillin.  MEDICATIONS:  Current Outpatient Medications  Medication Sig Dispense Refill   amLODipine (NORVASC) 10 MG tablet Take 10 mg by mouth daily.     ascorbic acid (VITAMIN C) 1000 MG tablet Take 1,000 mg by mouth daily.     atorvastatin (LIPITOR) 20 MG tablet Take 20 mg by mouth daily.     carvedilol (COREG) 3.125 MG tablet Take 3.125 mg by mouth 2 (two) times daily. (Patient not taking: Reported on 05/27/2023)     cephALEXin (KEFLEX) 500 MG capsule Take 1 capsule (500 mg total) by mouth 3 (three) times daily for 7 days. 21 capsule 0   cholecalciferol (VITAMIN D) 25 MCG (1000 UT) tablet Take 1,000 Units by mouth daily.     CINNAMON PO Take 1,000 mg by mouth daily.      glipiZIDE (GLUCOTROL) 5 MG tablet Take 5 mg by mouth daily.     hydrochlorothiazide (HYDRODIURIL) 25 MG tablet Take 25 mg by mouth daily.     magnesium oxide (MAG-OX) 400 MG tablet Take 400 mg by mouth daily.     meloxicam (MOBIC) 15 MG tablet Take 1 tablet (15 mg total) by mouth daily. Take with food. 30 tablet 1   metFORMIN (GLUCOPHAGE) 1000 MG tablet Take 1,000 mg by mouth 2 (two) times daily with a meal.     omega-3 acid ethyl esters (LOVAZA) 1 g capsule Take 1 capsule by mouth 2 (two) times daily.     ramipril (ALTACE) 10 MG capsule Take 10 mg by mouth 2 (two) times daily.      Semaglutide, 2 MG/DOSE, 8 MG/3ML SOPN Inject into the skin.     traMADol (ULTRAM) 50 MG tablet Take 1 tablet (50 mg total) by mouth every 8 (eight) hours as needed. 30 tablet 0   vitamin B-12 (CYANOCOBALAMIN) 1000 MCG tablet Take 1,000 mcg by mouth daily.     No current facility-administered medications for this visit.    PHYSICAL EXAMINATION:  Vitals:   06/01/23 1046  BP: 119/65  Pulse: 90  Temp: 98.6 F  (37 C)  SpO2: 99%   Filed Weights   06/01/23 1046  Weight: 235 lb (106.6 kg)    Physical Exam Vitals and nursing note reviewed.  HENT:     Head: Normocephalic and atraumatic.     Mouth/Throat:     Pharynx: Oropharynx is clear.  Eyes:     Extraocular Movements: Extraocular movements intact.     Pupils: Pupils are equal, round, and reactive to light.  Cardiovascular:     Rate and Rhythm: Normal rate and regular rhythm.  Pulmonary:     Comments: Decreased breath sounds bilaterally.  Abdominal:     Palpations: Abdomen is soft.  Musculoskeletal:        General: Normal range of  motion.     Cervical back: Normal range of motion.  Skin:    General: Skin is warm.  Neurological:     General: No focal deficit present.     Mental Status: He is alert and oriented to person, place, and time.  Psychiatric:        Behavior: Behavior normal.        Judgment: Judgment normal.     LABORATORY DATA:  I have reviewed the data as listed Lab Results  Component Value Date   WBC 17.2 (H) 06/01/2023   HGB 14.4 06/01/2023   HCT 42.7 06/01/2023   MCV 80.6 06/01/2023   PLT 420 (H) 06/01/2023   Recent Labs    05/14/23 1530 05/27/23 0943 05/31/23 1227 06/01/23 1139  NA 136 133* 128* 129*  K 4.1 4.8 5.3* 5.1  CL 100 95* 91* 92*  CO2 26 22 23  21*  GLUCOSE 165* 152* 182* 151*  BUN 15 22 37* 45*  CREATININE 1.39* 1.51* 1.89* 2.05*  CALCIUM 9.1 8.9 8.3* 8.5*  GFRNONAA 53* 48* 36* 33*  PROT 7.6 7.1  --  6.4*  ALBUMIN 3.1* 2.6*  --  2.4*  AST 94* 235*  --  293*  ALT 66* 100*  --  125*  ALKPHOS 681* 839*  --  820*  BILITOT 1.3* 4.1*  --  5.2*    RADIOGRAPHIC STUDIES: I have personally reviewed the radiological images as listed and agreed with the findings in the report. MR ABDOMEN MRCP W WO CONTAST  Result Date: 06/03/2023 CLINICAL DATA:  Elevated liver enzymes, hepatic metastatic disease, history of liver biopsy, worsening abdominal pain, follow-up recent CT EXAM: MRI ABDOMEN  WITHOUT AND WITH CONTRAST (INCLUDING MRCP) TECHNIQUE: Multiplanar multisequence MR imaging of the abdomen was performed both before and after the administration of intravenous contrast. Heavily T2-weighted images of the biliary and pancreatic ducts were obtained, and three-dimensional MRCP images were rendered by post processing. CONTRAST:  10mL GADAVIST GADOBUTROL 1 MMOL/ML IV SOLN COMPARISON:  CT abdomen pelvis, 05/31/2023 FINDINGS: Lower chest: Trace right pleural effusion, unchanged. Hepatobiliary: Hepatomegaly, maximum coronal span 24.2 cm. The liver is grossly enlarged by numerous bulky rim enhancing, internally hypoenhancing liver metastases as seen on prior examinations, not significantly changed. Large index lesion of the peripheral right lobe of the liver, hepatic segments VI/VII measures 10.0 x 5.5 cm (series 27, image 32). Gallstones and sludge contracted in the gallbladder. No intra or extrahepatic biliary ductal dilatation. Pancreas: Unremarkable. No pancreatic ductal dilatation or surrounding inflammatory changes. Spleen: Normal in size without significant abnormality. Adrenals/Urinary Tract: Adrenal glands are unremarkable. Multiple simple, benign and thinly septated renal cortical cysts, for which no specific further follow-up or characterization is required. Lobulated cortical scarring of the right kidney with prominence of the right renal pelvis and associated mucosal thickening, similar to prior examination (series 29, image 65). No obvious calculi or hydronephrosis. Stomach/Bowel: Stomach is within normal limits. No evidence of bowel wall thickening, distention, or inflammatory changes. Vascular/Lymphatic: Aortic atherosclerosis. Enlarged portacaval and porta hepatis lymph nodes measuring up to 4.0 x 1.6 cm (series 27, image 49). Prominent subcentimeter retroperitoneal lymph nodes (series 29, image 59). Other: No abdominal wall hernia or abnormality. Small volume perihepatic and perisplenic  ascites, similar to prior examination. Musculoskeletal: Diffusely heterogeneous enhancement of the included vertebral bodies. IMPRESSION: 1. The liver is grossly enlarged by numerous bulky rim enhancing, internally hypoenhancing liver metastases as seen on prior examinations, not significantly changed. 2. No biliary ductal dilatation. 3. Enlarged portacaval  and porta hepatis lymph nodes measuring up to 4.0 x 1.6 cm. Prominent subcentimeter retroperitoneal lymph nodes. Findings are suspicious for nodal metastatic disease. 4. Diffusely heterogeneous enhancement of the included vertebral bodies, suspicious for osseous metastatic disease. 5. Small volume perihepatic and perisplenic ascites, similar to prior examination. Trace right pleural effusion, unchanged. 6. Lobulated cortical scarring of the right kidney with prominence of the right renal pelvis and associated mucosal thickening, similar to prior examination. No obvious calculi or hydronephrosis. 7. Cholelithiasis. Aortic Atherosclerosis (ICD10-I70.0). Electronically Signed   By: Jearld Lesch M.D.   On: 06/03/2023 10:02   MR 3D Recon At Scanner  Result Date: 06/03/2023 CLINICAL DATA:  Elevated liver enzymes, hepatic metastatic disease, history of liver biopsy, worsening abdominal pain, follow-up recent CT EXAM: MRI ABDOMEN WITHOUT AND WITH CONTRAST (INCLUDING MRCP) TECHNIQUE: Multiplanar multisequence MR imaging of the abdomen was performed both before and after the administration of intravenous contrast. Heavily T2-weighted images of the biliary and pancreatic ducts were obtained, and three-dimensional MRCP images were rendered by post processing. CONTRAST:  10mL GADAVIST GADOBUTROL 1 MMOL/ML IV SOLN COMPARISON:  CT abdomen pelvis, 05/31/2023 FINDINGS: Lower chest: Trace right pleural effusion, unchanged. Hepatobiliary: Hepatomegaly, maximum coronal span 24.2 cm. The liver is grossly enlarged by numerous bulky rim enhancing, internally hypoenhancing liver  metastases as seen on prior examinations, not significantly changed. Large index lesion of the peripheral right lobe of the liver, hepatic segments VI/VII measures 10.0 x 5.5 cm (series 27, image 32). Gallstones and sludge contracted in the gallbladder. No intra or extrahepatic biliary ductal dilatation. Pancreas: Unremarkable. No pancreatic ductal dilatation or surrounding inflammatory changes. Spleen: Normal in size without significant abnormality. Adrenals/Urinary Tract: Adrenal glands are unremarkable. Multiple simple, benign and thinly septated renal cortical cysts, for which no specific further follow-up or characterization is required. Lobulated cortical scarring of the right kidney with prominence of the right renal pelvis and associated mucosal thickening, similar to prior examination (series 29, image 65). No obvious calculi or hydronephrosis. Stomach/Bowel: Stomach is within normal limits. No evidence of bowel wall thickening, distention, or inflammatory changes. Vascular/Lymphatic: Aortic atherosclerosis. Enlarged portacaval and porta hepatis lymph nodes measuring up to 4.0 x 1.6 cm (series 27, image 49). Prominent subcentimeter retroperitoneal lymph nodes (series 29, image 59). Other: No abdominal wall hernia or abnormality. Small volume perihepatic and perisplenic ascites, similar to prior examination. Musculoskeletal: Diffusely heterogeneous enhancement of the included vertebral bodies. IMPRESSION: 1. The liver is grossly enlarged by numerous bulky rim enhancing, internally hypoenhancing liver metastases as seen on prior examinations, not significantly changed. 2. No biliary ductal dilatation. 3. Enlarged portacaval and porta hepatis lymph nodes measuring up to 4.0 x 1.6 cm. Prominent subcentimeter retroperitoneal lymph nodes. Findings are suspicious for nodal metastatic disease. 4. Diffusely heterogeneous enhancement of the included vertebral bodies, suspicious for osseous metastatic disease. 5.  Small volume perihepatic and perisplenic ascites, similar to prior examination. Trace right pleural effusion, unchanged. 6. Lobulated cortical scarring of the right kidney with prominence of the right renal pelvis and associated mucosal thickening, similar to prior examination. No obvious calculi or hydronephrosis. 7. Cholelithiasis. Aortic Atherosclerosis (ICD10-I70.0). Electronically Signed   By: Jearld Lesch M.D.   On: 06/03/2023 10:02   CT ABDOMEN PELVIS WO CONTRAST  Result Date: 05/31/2023 CLINICAL DATA:  Abdominal pain EXAM: CT ABDOMEN AND PELVIS WITHOUT CONTRAST TECHNIQUE: Multidetector CT imaging of the abdomen and pelvis was performed following the standard protocol without IV contrast. RADIATION DOSE REDUCTION: This exam was performed according to the departmental  dose-optimization program which includes automated exposure control, adjustment of the mA and/or kV according to patient size and/or use of iterative reconstruction technique. COMPARISON:  05/07/2023 FINDINGS: Lower chest: Aortic and coronary artery atherosclerosis. Trace right pleural effusion. Hepatobiliary: Diffuse hypoattenuating masses throughout both lobes of the liver, increased in size compared to the recent prior CT. Liver measures approximately 24 cm in length. Sludge or small stones within the gallbladder. Gallbladder is partially contracted. Pancreas: No focal abnormality of the pancreas is identified on noncontrast imaging. No inflammatory changes. Spleen: Normal in size without focal abnormality. Adrenals/Urinary Tract: No adrenal nodules or masses. Urothelial thickening of the right renal pelvis is similar in appearance to prior. No stone or hydronephrosis. Urinary bladder within normal limits. Stomach/Bowel: Stomach is within normal limits. Colonic diverticulosis. No evidence of bowel wall thickening, distention, or inflammatory changes. Vascular/Lymphatic: Aortoiliac atherosclerosis without aneurysm. Several small  retroperitoneal lymph nodes are similar to prior. No abdominopelvic lymphadenopathy by size criteria. Reproductive: Prostate is unremarkable. Other: Small volume ascites is new from prior. No pneumoperitoneum. Small fat containing umbilical hernia. Musculoskeletal: There are several small sclerotic lesions throughout the spine, bilateral ribs, and pelvis, similar to slightly increased compared to prior. Reference lesion is a small sclerotic lesion within the T8 vertebral body (series 5, image 90). IMPRESSION: 1. Worsening hepatic metastatic disease. 2. Small volume ascites is new from prior. 3. Several small sclerotic lesions throughout the spine, bilateral ribs, and pelvis, similar to slightly increased compared to prior. Findings are compatible with osseous metastatic disease. 4. Urothelial thickening of the right renal pelvis is similar in appearance to prior. Correlate with urinalysis. 5. Trace right pleural effusion. 6. Colonic diverticulosis without evidence of diverticulitis. No bowel obstruction. 7. Aortic and coronary artery atherosclerosis (ICD10-I70.0). Electronically Signed   By: Duanne Guess D.O.   On: 05/31/2023 18:30   CT CHEST W CONTRAST  Result Date: 05/27/2023 CLINICAL DATA:  Liver metastases on recent CT abdomen. * Tracking Code: BO * EXAM: CT CHEST WITH CONTRAST TECHNIQUE: Multidetector CT imaging of the chest was performed during intravenous contrast administration. RADIATION DOSE REDUCTION: This exam was performed according to the departmental dose-optimization program which includes automated exposure control, adjustment of the mA and/or kV according to patient size and/or use of iterative reconstruction technique. CONTRAST:  75mL OMNIPAQUE IOHEXOL 300 MG/ML  SOLN COMPARISON:  CT abdomen pelvis 05/07/2023 and CT chest 12/04/2021. FINDINGS: Cardiovascular: Atherosclerotic calcification of the aorta, aortic valve and coronary arteries. Ascending aorta measures up to 3.9 cm (4/71).  Enlarged pulmonic trunk and heart. No pericardial effusion. Mediastinum/Nodes: No pathologically enlarged mediastinal, hilar or axillary lymph nodes. Esophagus is grossly unremarkable. Lungs/Pleura: Lungs are clear. No pleural fluid. Airway is unremarkable. Upper Abdomen: Mildly hypoattenuating masses throughout the liver with index mass in the right hepatic lobe measuring 6.5 x 8.3 cm (2/145). Visualized portions of the gallbladder, adrenal glands and right kidney are otherwise grossly unremarkable. There may be a left renal sinus cyst, partially imaged. No specific follow-up necessary. Visualized portions of the spleen, pancreas, stomach and bowel are grossly unremarkable. Scattered ascites. Periportal adenopathy measures up to 1.5 cm. Musculoskeletal: Degenerative changes in the spine. T11-12 posterior fusion. Scattered sclerotic lesions in the visualized osseous structures, new from 12/04/2021. IMPRESSION: 1. Hepatic and osseous metastatic disease. No evidence of primary malignancy in the chest. 2. Scattered ascites. 3. Aortic atherosclerosis (ICD10-I70.0). Coronary artery calcification. 4. Enlarged pulmonic trunk, indicative of pulmonary arterial hypertension. Electronically Signed   By: Leanna Battles M.D.   On: 05/27/2023  13:45   US BIOPSY (LIVER)  Result Date: 05/27/2023 INDICATION: Multiple liver lesions with unknown primary malignancy. EXAM: ULTRASOUND GUIDED CORE BIOPSY OF LIVER MASS MEDICATIONS: None. ANESTHESIA/SEDATION: Moderate (conscious) sedation was employed during this procedure. A total of Versed 1.0 mg and Fentanyl 50 mcg was administered intravenously. Moderate Sedation Time: 10 minutes. The patient's level of consciousness and vital signs were monitored continuously by radiology nursing throughout the procedure under my direct supervision. PROCEDURE: The procedure, risks, benefits, and alternatives were explained to the patient. Questions regarding the procedure were encouraged and  answered. The patient understands and consents to the procedure. A time-out was performed prior to initiating the procedure. Ultrasound was performed to localize liver lesions. The abdominal wall was prepped with chlorhexidine in a sterile fashion, and a sterile drape was applied covering the operative field. A sterile gown and sterile gloves were used for the procedure. Local anesthesia was provided with 1% Lidocaine. A 17 gauge trocar needle was advanced into the liver at the level of a right lobe liver lesion under ultrasound guidance. After confirming needle tip position, coaxial 18 gauge core biopsy samples were obtained and submitted in formalin. Three total core biopsy samples were obtained. Gel-Foam pledgets were advanced through the outer needle prior to its removal. Additional ultrasound was performed. COMPLICATIONS: None immediate. FINDINGS: Ultrasound demonstrates multiple circumscribed and ill-defined lesions within the liver. A lesion in the inferior right lobe measuring up to approximately 4.8 cm in maximum diameter was chosen for sampling. Solid core biopsy samples were obtained. IMPRESSION: Ultrasound-guided core biopsy performed of a mass within the right lobe of the liver measuring up to approximately 4.8 cm in maximum diameter. Electronically Signed   By: Irish Lack M.D.   On: 05/27/2023 13:27   CT ABDOMEN PELVIS W CONTRAST  Result Date: 05/07/2023 CLINICAL DATA:  Abdominal pain for several weeks. Diverticulosis. Duodenal ulcer. * Tracking Code: BO * EXAM: CT ABDOMEN AND PELVIS WITH CONTRAST TECHNIQUE: Multidetector CT imaging of the abdomen and pelvis was performed using the standard protocol following bolus administration of intravenous contrast. RADIATION DOSE REDUCTION: This exam was performed according to the departmental dose-optimization program which includes automated exposure control, adjustment of the mA and/or kV according to patient size and/or use of iterative  reconstruction technique. CONTRAST:  OMNIPAQUE IOHEXOL 300 MG/ML  SOLN COMPARISON:  12/04/2021 FINDINGS: Lower Chest: No acute findings. Hepatobiliary: Numerous hypovascular masses are seen throughout the liver which are new since previous study, consistent with diffuse liver metastases. Insert Pancreas:  No mass or inflammatory changes. Spleen: Within normal limits in size and appearance. Adrenals/Urinary Tract: No suspicious masses identified. Increased diffuse right renal parenchymal atrophy is seen since previous study, with significantly decreased pelvicaliectasis or parapelvic cyst. No evidence of ureteral calculi or dilatation. Unremarkable unopacified urinary bladder. Stomach/Bowel: No evidence of obstruction, inflammatory process or abnormal fluid collections. Diverticulosis is seen mainly involving the descending and sigmoid colon, however there is no evidence of diverticulitis. Normal appendix visualized. Vascular/Lymphatic: Shotty sub-cm lymph nodes are seen throughout the abdominal retroperitoneum none of which are pathologically enlarged. No pathologically enlarged lymph nodes. No acute vascular findings. Reproductive:  No mass or other significant abnormality. Other:  Small umbilical hernia is seen, which contains only fat. Musculoskeletal:  No suspicious bone lesions identified. IMPRESSION: Diffuse liver metastases. No primary malignancy identified within the abdomen or pelvis. Colonic diverticulosis, without radiographic evidence of diverticulitis. Increased diffuse right renal parenchymal atrophy, with significantly decreased pelvicaliectasis or parapelvic cyst. Small umbilical hernia, which contains  only fat. Electronically Signed   By: Danae Orleans M.D.   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  IHC positive for CK7,  CK20, CDX2, negative for GATA3, PAX8, TTF-1, S100 and CD1 1 7.  Differentials include GI origin, pancreaticobiliary system, head and neck region.  Intrahepatic cholangiocarcinoma cannot be entirely excluded.  -CT abdomen pelvis from 05/31/2023 done in the ED when he presented with abdominal pain showed interval progression of the liver lesions.  -I discussed with the patient, wife and Honey Guidotti who is a physician at [facility] in detail about the findings.  Liver biopsy is showing adenocarcinoma of unclear origin.  Discussed he has stage IV cancer which unfortunately cannot be cured.  Goal of any treatment would be palliative.  Unfortunately, his cancer is behaving aggressively as evidenced by rapid interval progression of the liver lesions and markedly elevated tumor markers.  CA 19-9 around 150K and CEA around 16,000.  Will obtain stat PET CT scan to guide with primary origin of the tumor.  Will also send the sample for [employer] testing.  His last colonoscopy was in 2018.  If, there are no discrete masses on the PET to guide tumor origin, it could be cholangiocarcinoma with markedly elevated CA 19-9 and CEA.  In colon cancer, CEA can be elevated but less often CA 19-9.  He will follow-up with Dr. Donneta Romberg next week after the PET scan to discuss the treatment plan.  Referral to palliative care.  We also discussed about port placement to expedite they would like to hold off until PET scan is completed.  # Transaminitis # Hyperbilirubinemia -AST 293, ALT 125, ALP 820, total bilirubin 5.2.  LFTs have significantly worsened in the past 1 to 2 weeks.  Recent CT scan did not show any concern for obstruction. Worsening transaminitis is potentially related to worsening metastatic liver disease.  But with disproportionate increase in ALP and hyperbilirubinemia, I would also like to obtain MRCP to rule out any biliary obstruction. -Advised to avoid hepatotoxic medications such as Tylenol. -He can continue with  tramadol for pain.  # Cancer-related pain-continue with tramadol 50 mg every 8 as needed for now.  RTC in 1 week for follow-up with Dr. B, labs, possible fluids.   Total time spent with patient discussion, chart review 40 minutes

## 2023-06-03 NOTE — Progress Notes (Signed)
Patient for IR Port Placement on Friday 06/04/2023, I called and spoke with the patient's wife, Britta Mccreedy on the phone and gave pre-procedure instructions. Britta Mccreedy was made aware to have the patient here at 2p, NPO after MN prior to procedure as well as driver post procedure/recovery/discharge. Britta Mccreedy stated understanding.  Called 06/03/2023

## 2023-06-04 ENCOUNTER — Encounter: Payer: Self-pay | Admitting: Orthopedic Surgery

## 2023-06-04 ENCOUNTER — Inpatient Hospital Stay
Admission: EM | Admit: 2023-06-04 | Discharge: 2023-07-04 | DRG: 871 | Disposition: E | Payer: Medicare HMO | Attending: Internal Medicine | Admitting: Internal Medicine

## 2023-06-04 ENCOUNTER — Ambulatory Visit
Admission: RE | Admit: 2023-06-04 | Discharge: 2023-06-04 | Disposition: A | Discharge status: Home / Self Care | Payer: Medicare HMO | Source: Ambulatory Visit | Attending: Internal Medicine | Admitting: Internal Medicine

## 2023-06-04 ENCOUNTER — Other Ambulatory Visit: Payer: Self-pay

## 2023-06-04 ENCOUNTER — Emergency Department: Payer: Medicare HMO

## 2023-06-04 ENCOUNTER — Encounter: Payer: Self-pay | Admitting: Radiology

## 2023-06-04 DIAGNOSIS — R6520 Severe sepsis without septic shock: Secondary | ICD-10-CM | POA: Diagnosis present

## 2023-06-04 DIAGNOSIS — Z7985 Long-term (current) use of injectable non-insulin antidiabetic drugs: Secondary | ICD-10-CM

## 2023-06-04 DIAGNOSIS — Z515 Encounter for palliative care: Secondary | ICD-10-CM

## 2023-06-04 DIAGNOSIS — E875 Hyperkalemia: Secondary | ICD-10-CM | POA: Diagnosis present

## 2023-06-04 DIAGNOSIS — I129 Hypertensive chronic kidney disease with stage 1 through stage 4 chronic kidney disease, or unspecified chronic kidney disease: Secondary | ICD-10-CM | POA: Diagnosis present

## 2023-06-04 DIAGNOSIS — I4892 Unspecified atrial flutter: Secondary | ICD-10-CM | POA: Diagnosis present

## 2023-06-04 DIAGNOSIS — I10 Essential (primary) hypertension: Secondary | ICD-10-CM | POA: Diagnosis present

## 2023-06-04 DIAGNOSIS — G9341 Metabolic encephalopathy: Secondary | ICD-10-CM | POA: Diagnosis not present

## 2023-06-04 DIAGNOSIS — R7401 Elevation of levels of liver transaminase levels: Secondary | ICD-10-CM | POA: Diagnosis present

## 2023-06-04 DIAGNOSIS — E785 Hyperlipidemia, unspecified: Secondary | ICD-10-CM | POA: Diagnosis present

## 2023-06-04 DIAGNOSIS — C787 Secondary malignant neoplasm of liver and intrahepatic bile duct: Secondary | ICD-10-CM | POA: Diagnosis present

## 2023-06-04 DIAGNOSIS — K219 Gastro-esophageal reflux disease without esophagitis: Secondary | ICD-10-CM | POA: Diagnosis present

## 2023-06-04 DIAGNOSIS — R6521 Severe sepsis with septic shock: Secondary | ICD-10-CM | POA: Diagnosis present

## 2023-06-04 DIAGNOSIS — G893 Neoplasm related pain (acute) (chronic): Secondary | ICD-10-CM | POA: Diagnosis present

## 2023-06-04 DIAGNOSIS — Z981 Arthrodesis status: Secondary | ICD-10-CM

## 2023-06-04 DIAGNOSIS — E669 Obesity, unspecified: Secondary | ICD-10-CM | POA: Diagnosis present

## 2023-06-04 DIAGNOSIS — I499 Cardiac arrhythmia, unspecified: Secondary | ICD-10-CM | POA: Diagnosis not present

## 2023-06-04 DIAGNOSIS — R338 Other retention of urine: Secondary | ICD-10-CM | POA: Diagnosis not present

## 2023-06-04 DIAGNOSIS — E1122 Type 2 diabetes mellitus with diabetic chronic kidney disease: Secondary | ICD-10-CM | POA: Diagnosis present

## 2023-06-04 DIAGNOSIS — Z880 Allergy status to penicillin: Secondary | ICD-10-CM

## 2023-06-04 DIAGNOSIS — N179 Acute kidney failure, unspecified: Secondary | ICD-10-CM | POA: Diagnosis present

## 2023-06-04 DIAGNOSIS — Z8249 Family history of ischemic heart disease and other diseases of the circulatory system: Secondary | ICD-10-CM

## 2023-06-04 DIAGNOSIS — Z888 Allergy status to other drugs, medicaments and biological substances status: Secondary | ICD-10-CM

## 2023-06-04 DIAGNOSIS — Z7984 Long term (current) use of oral hypoglycemic drugs: Secondary | ICD-10-CM

## 2023-06-04 DIAGNOSIS — K7200 Acute and subacute hepatic failure without coma: Secondary | ICD-10-CM | POA: Diagnosis present

## 2023-06-04 DIAGNOSIS — N390 Urinary tract infection, site not specified: Secondary | ICD-10-CM

## 2023-06-04 DIAGNOSIS — Z87891 Personal history of nicotine dependence: Secondary | ICD-10-CM

## 2023-06-04 DIAGNOSIS — Z66 Do not resuscitate: Secondary | ICD-10-CM | POA: Diagnosis not present

## 2023-06-04 DIAGNOSIS — I4891 Unspecified atrial fibrillation: Secondary | ICD-10-CM | POA: Diagnosis present

## 2023-06-04 DIAGNOSIS — C779 Secondary and unspecified malignant neoplasm of lymph node, unspecified: Secondary | ICD-10-CM | POA: Diagnosis present

## 2023-06-04 DIAGNOSIS — I959 Hypotension, unspecified: Secondary | ICD-10-CM | POA: Diagnosis present

## 2023-06-04 DIAGNOSIS — E871 Hypo-osmolality and hyponatremia: Secondary | ICD-10-CM | POA: Diagnosis present

## 2023-06-04 DIAGNOSIS — K8000 Calculus of gallbladder with acute cholecystitis without obstruction: Secondary | ICD-10-CM | POA: Diagnosis present

## 2023-06-04 DIAGNOSIS — R339 Retention of urine, unspecified: Secondary | ICD-10-CM | POA: Diagnosis present

## 2023-06-04 DIAGNOSIS — R188 Other ascites: Secondary | ICD-10-CM | POA: Diagnosis present

## 2023-06-04 DIAGNOSIS — I2510 Atherosclerotic heart disease of native coronary artery without angina pectoris: Secondary | ICD-10-CM | POA: Diagnosis present

## 2023-06-04 DIAGNOSIS — Z791 Long term (current) use of non-steroidal anti-inflammatories (NSAID): Secondary | ICD-10-CM

## 2023-06-04 DIAGNOSIS — C801 Malignant (primary) neoplasm, unspecified: Secondary | ICD-10-CM | POA: Diagnosis present

## 2023-06-04 DIAGNOSIS — Z79899 Other long term (current) drug therapy: Secondary | ICD-10-CM

## 2023-06-04 DIAGNOSIS — N1831 Chronic kidney disease, stage 3a: Secondary | ICD-10-CM | POA: Diagnosis present

## 2023-06-04 DIAGNOSIS — K828 Other specified diseases of gallbladder: Secondary | ICD-10-CM | POA: Diagnosis present

## 2023-06-04 DIAGNOSIS — E8729 Other acidosis: Secondary | ICD-10-CM | POA: Diagnosis present

## 2023-06-04 DIAGNOSIS — Z96652 Presence of left artificial knee joint: Secondary | ICD-10-CM | POA: Diagnosis present

## 2023-06-04 DIAGNOSIS — D631 Anemia in chronic kidney disease: Secondary | ICD-10-CM | POA: Diagnosis present

## 2023-06-04 DIAGNOSIS — C7951 Secondary malignant neoplasm of bone: Secondary | ICD-10-CM | POA: Diagnosis present

## 2023-06-04 DIAGNOSIS — E872 Acidosis, unspecified: Secondary | ICD-10-CM

## 2023-06-04 DIAGNOSIS — Z6837 Body mass index (BMI) 37.0-37.9, adult: Secondary | ICD-10-CM

## 2023-06-04 DIAGNOSIS — C259 Malignant neoplasm of pancreas, unspecified: Secondary | ICD-10-CM | POA: Diagnosis present

## 2023-06-04 DIAGNOSIS — E119 Type 2 diabetes mellitus without complications: Secondary | ICD-10-CM

## 2023-06-04 DIAGNOSIS — A419 Sepsis, unspecified organism: Principal | ICD-10-CM | POA: Diagnosis present

## 2023-06-04 DIAGNOSIS — Z8711 Personal history of peptic ulcer disease: Secondary | ICD-10-CM

## 2023-06-04 DIAGNOSIS — R7989 Other specified abnormal findings of blood chemistry: Secondary | ICD-10-CM | POA: Diagnosis present

## 2023-06-04 DIAGNOSIS — C799 Secondary malignant neoplasm of unspecified site: Secondary | ICD-10-CM

## 2023-06-04 DIAGNOSIS — Z87440 Personal history of urinary (tract) infections: Secondary | ICD-10-CM

## 2023-06-04 DIAGNOSIS — R9431 Abnormal electrocardiogram [ECG] [EKG]: Secondary | ICD-10-CM | POA: Diagnosis present

## 2023-06-04 LAB — LACTIC ACID, PLASMA
Lactic Acid, Venous: 6.2 mmol/L (ref 0.5–1.9)
Lactic Acid, Venous: 5.5 mmol/L (ref 0.5–1.9)

## 2023-06-04 LAB — URINALYSIS, W/ REFLEX TO CULTURE (INFECTION SUSPECTED)
Bacteria, UA: NONE SEEN
Glucose, UA: 50 mg/dL — AB
Ketones, ur: NEGATIVE mg/dL
Leukocytes,Ua: NEGATIVE
Nitrite: NEGATIVE
Protein, ur: 30 mg/dL — AB
Specific Gravity, Urine: 1.024 (ref 1.005–1.030)
pH: 5 (ref 5.0–8.0)

## 2023-06-04 LAB — CBC
HCT: 40.6 % (ref 39.0–52.0)
Hemoglobin: 13.9 g/dL (ref 13.0–17.0)
MCH: 27.6 pg (ref 26.0–34.0)
MCHC: 34.2 g/dL (ref 30.0–36.0)
MCV: 80.6 fL (ref 80.0–100.0)
Platelets: 300 10*3/uL (ref 150–400)
RBC: 5.04 MIL/uL (ref 4.22–5.81)
RDW: 20 % — ABNORMAL HIGH (ref 11.5–15.5)
WBC: 17.8 10*3/uL — ABNORMAL HIGH (ref 4.0–10.5)
nRBC: 0 % (ref 0.0–0.2)

## 2023-06-04 LAB — COMPREHENSIVE METABOLIC PANEL
ALT: 156 U/L — ABNORMAL HIGH (ref 0–44)
AST: 306 U/L — ABNORMAL HIGH (ref 15–41)
Albumin: 2.2 g/dL — ABNORMAL LOW (ref 3.5–5.0)
Alkaline Phosphatase: 968 U/L — ABNORMAL HIGH (ref 38–126)
Anion gap: 18 — ABNORMAL HIGH (ref 5–15)
BUN: 50 mg/dL — ABNORMAL HIGH (ref 8–23)
CO2: 17 mmol/L — ABNORMAL LOW (ref 22–32)
Calcium: 8.5 mg/dL — ABNORMAL LOW (ref 8.9–10.3)
Chloride: 93 mmol/L — ABNORMAL LOW (ref 98–111)
Creatinine, Ser: 1.89 mg/dL — ABNORMAL HIGH (ref 0.61–1.24)
GFR, Estimated: 36 mL/min — ABNORMAL LOW (ref >60)
Glucose, Bld: 174 mg/dL — ABNORMAL HIGH (ref 70–99)
Potassium: 5.8 mmol/L — ABNORMAL HIGH (ref 3.5–5.1)
Sodium: 128 mmol/L — ABNORMAL LOW (ref 135–145)
Total Bilirubin: 7.1 mg/dL — ABNORMAL HIGH (ref 0.3–1.2)
Total Protein: 6 g/dL — ABNORMAL LOW (ref 6.5–8.1)

## 2023-06-04 LAB — CBC WITH DIFFERENTIAL/PLATELET
Abs Immature Granulocytes: 0.13 10*3/uL — ABNORMAL HIGH (ref 0.00–0.07)
Basophils Absolute: 0.1 10*3/uL (ref 0.0–0.1)
Basophils Relative: 1 %
Eosinophils Absolute: 0 10*3/uL (ref 0.0–0.5)
Eosinophils Relative: 0 %
HCT: 45 % (ref 39.0–52.0)
Hemoglobin: 15.2 g/dL (ref 13.0–17.0)
Immature Granulocytes: 1 %
Lymphocytes Relative: 6 %
Lymphs Abs: 1 10*3/uL (ref 0.7–4.0)
MCH: 27 pg (ref 26.0–34.0)
MCHC: 33.8 g/dL (ref 30.0–36.0)
MCV: 79.8 fL — ABNORMAL LOW (ref 80.0–100.0)
Monocytes Absolute: 1.5 10*3/uL — ABNORMAL HIGH (ref 0.1–1.0)
Monocytes Relative: 9 %
Neutro Abs: 15.1 10*3/uL — ABNORMAL HIGH (ref 1.7–7.7)
Neutrophils Relative %: 83 %
Platelets: 329 10*3/uL (ref 150–400)
RBC: 5.64 MIL/uL (ref 4.22–5.81)
RDW: 20.4 % — ABNORMAL HIGH (ref 11.5–15.5)
WBC: 17.9 10*3/uL — ABNORMAL HIGH (ref 4.0–10.5)
nRBC: 0 % (ref 0.0–0.2)

## 2023-06-04 LAB — BASIC METABOLIC PANEL
Anion gap: 14 (ref 5–15)
BUN: 51 mg/dL — ABNORMAL HIGH (ref 8–23)
CO2: 21 mmol/L — ABNORMAL LOW (ref 22–32)
Calcium: 8.2 mg/dL — ABNORMAL LOW (ref 8.9–10.3)
Chloride: 95 mmol/L — ABNORMAL LOW (ref 98–111)
Creatinine, Ser: 1.97 mg/dL — ABNORMAL HIGH (ref 0.61–1.24)
GFR, Estimated: 35 mL/min — ABNORMAL LOW (ref >60)
Glucose, Bld: 177 mg/dL — ABNORMAL HIGH (ref 70–99)
Potassium: 5.8 mmol/L — ABNORMAL HIGH (ref 3.5–5.1)
Sodium: 130 mmol/L — ABNORMAL LOW (ref 135–145)

## 2023-06-04 LAB — PROTIME-INR
INR: 1.5 — ABNORMAL HIGH (ref 0.8–1.2)
Prothrombin Time: 17.9 s — ABNORMAL HIGH (ref 11.4–15.2)

## 2023-06-04 LAB — GLUCOSE, CAPILLARY: Glucose-Capillary: 179 mg/dL — ABNORMAL HIGH (ref 70–99)

## 2023-06-04 LAB — CBG MONITORING, ED: Glucose-Capillary: 152 mg/dL — ABNORMAL HIGH (ref 70–99)

## 2023-06-04 MED ORDER — SODIUM CHLORIDE 0.9 % IV SOLN
2.0000 g | Freq: Once | INTRAVENOUS | Status: AC
  Administered 2023-06-04: 2 g via INTRAVENOUS
  Filled 2023-06-04: qty 12.5

## 2023-06-04 MED ORDER — VANCOMYCIN HCL IN DEXTROSE 1-5 GM/200ML-% IV SOLN: 1000.0000 mg | Freq: Once | INTRAVENOUS | Status: DC

## 2023-06-04 MED ORDER — ENOXAPARIN SODIUM 60 MG/0.6ML IJ SOSY
0.5000 mg/kg | PREFILLED_SYRINGE | INTRAMUSCULAR | Status: DC
  Administered 2023-06-04 – 2023-06-05 (×2): 52.5 mg via SUBCUTANEOUS
  Filled 2023-06-04 (×2): qty 0.6

## 2023-06-04 MED ORDER — HYDROCODONE-ACETAMINOPHEN 5-325 MG PO TABS: 1.0000 | ORAL_TABLET | ORAL | Status: DC | PRN

## 2023-06-04 MED ORDER — DILTIAZEM HCL 25 MG/5ML IV SOLN
10.0000 mg | Freq: Once | INTRAVENOUS | Status: AC
  Administered 2023-06-04: 10 mg via INTRAVENOUS
  Filled 2023-06-04: qty 5

## 2023-06-04 MED ORDER — SODIUM CHLORIDE 0.9 % IV SOLN: 2.0000 g | Freq: Once | INTRAVENOUS | Status: DC

## 2023-06-04 MED ORDER — LACTATED RINGERS IV BOLUS
1000.0000 mL | Freq: Once | INTRAVENOUS | Status: AC
  Administered 2023-06-04: 1000 mL via INTRAVENOUS

## 2023-06-04 MED ORDER — VANCOMYCIN HCL 1250 MG/250ML IV SOLN
1250.0000 mg | INTRAVENOUS | Status: DC
  Filled 2023-06-04: qty 250

## 2023-06-04 MED ORDER — INSULIN ASPART 100 UNIT/ML IV SOLN
5.0000 [IU] | Freq: Once | INTRAVENOUS | Status: AC
  Administered 2023-06-04: 5 [IU] via INTRAVENOUS
  Filled 2023-06-04 (×2): qty 0.05

## 2023-06-04 MED ORDER — ENOXAPARIN SODIUM 40 MG/0.4ML IJ SOSY
40.0000 mg | PREFILLED_SYRINGE | INTRAMUSCULAR | Status: DC
Start: 2023-06-04 — End: 2023-06-04

## 2023-06-04 MED ORDER — LACTATED RINGERS IV SOLN
150.0000 mL/h | INTRAVENOUS | Status: AC
  Administered 2023-06-04 – 2023-06-05 (×3): 150 mL/h via INTRAVENOUS

## 2023-06-04 MED ORDER — VANCOMYCIN HCL IN DEXTROSE 1-5 GM/200ML-% IV SOLN
1000.0000 mg | Freq: Once | INTRAVENOUS | Status: AC
  Administered 2023-06-04: 1000 mg via INTRAVENOUS
  Filled 2023-06-04: qty 200

## 2023-06-04 MED ORDER — SODIUM CHLORIDE 0.9 % IV SOLN
2.0000 g | Freq: Two times a day (BID) | INTRAVENOUS | Status: DC
  Administered 2023-06-04 – 2023-06-07 (×7): 2 g via INTRAVENOUS
  Filled 2023-06-04 (×8): qty 12.5

## 2023-06-04 MED ORDER — SODIUM ZIRCONIUM CYCLOSILICATE 10 G PO PACK
10.0000 g | PACK | Freq: Once | ORAL | Status: AC
  Administered 2023-06-04: 10 g via ORAL
  Filled 2023-06-04: qty 1

## 2023-06-04 MED ORDER — INSULIN ASPART 100 UNIT/ML IJ SOLN
0.0000 [IU] | INTRAMUSCULAR | Status: DC
  Administered 2023-06-04: 3 [IU] via SUBCUTANEOUS
  Administered 2023-06-05: 2 [IU] via SUBCUTANEOUS
  Administered 2023-06-05: 3 [IU] via SUBCUTANEOUS
  Filled 2023-06-04 (×3): qty 1

## 2023-06-04 MED ORDER — ACETAMINOPHEN 325 MG PO TABS: 650.0000 mg | ORAL_TABLET | Freq: Four times a day (QID) | ORAL | Status: DC | PRN

## 2023-06-04 MED ORDER — ACETAMINOPHEN 650 MG RE SUPP: 650.0000 mg | Freq: Four times a day (QID) | RECTAL | Status: DC | PRN

## 2023-06-04 MED ORDER — DEXTROSE 50 % IV SOLN
1.0000 | Freq: Once | INTRAVENOUS | Status: AC
  Administered 2023-06-04: 50 mL via INTRAVENOUS
  Filled 2023-06-04: qty 50

## 2023-06-04 MED ORDER — METOPROLOL TARTRATE 5 MG/5ML IV SOLN
5.0000 mg | Freq: Once | INTRAVENOUS | Status: AC
  Administered 2023-06-04: 5 mg via INTRAVENOUS
  Filled 2023-06-04: qty 5

## 2023-06-04 MED ORDER — METRONIDAZOLE 500 MG/100ML IV SOLN
500.0000 mg | Freq: Two times a day (BID) | INTRAVENOUS | Status: DC
  Administered 2023-06-04 – 2023-06-09 (×10): 500 mg via INTRAVENOUS
  Filled 2023-06-04 (×10): qty 100

## 2023-06-04 MED ORDER — KETOROLAC TROMETHAMINE 15 MG/ML IJ SOLN
15.0000 mg | Freq: Four times a day (QID) | INTRAMUSCULAR | Status: DC | PRN
  Administered 2023-06-04: 15 mg via INTRAVENOUS
  Filled 2023-06-04: qty 1

## 2023-06-04 NOTE — ED Provider Notes (Signed)
[facility] Provider Note    Event Date/Time   First MD Initiated Contact with Patient 06/21/2023 1520     (approximate)   History   Urinary Tract Infection   HPI  Jordan [name] is a 77 y.o. male  who presents to the emergency department today from IR clinic because of concern for fast heart rate and hypotension. The patient was going to IR for a port placement for cancer treatment. Patient has liver cancer. He says that he was recently diagnosed with a UTI but has not been consistently taking his antibiotics because he has not been eating or drinking well and did not want to have to take his medication on an empty stomach.        Physical Exam   Triage Vital Signs: ED Triage Vitals  Encounter Vitals Group     BP 06/18/2023 1510 (!) 89/60     Systolic BP Percentile --      Diastolic BP Percentile --      Pulse Rate 06/11/2023 1510 (!) 136     Resp 06/16/2023 1510 20     Temp 06/07/2023 1510 97.8 F (36.6 C)     Temp Source 06/30/2023 1510 Axillary     SpO2 07/01/2023 1510 98 %     Weight --      Height --      Head Circumference --      Peak Flow --      Pain Score 07/03/2023 1508 4     Pain Loc --      Pain Education --      Exclude from Growth Chart --     Most recent vital signs: Vitals:   06/12/2023 1510  BP: (!) 89/60  Pulse: (!) 136  Resp: 20  Temp: 97.8 F (36.6 C)  SpO2: 98%   General: Awake, alert, oriented. CV:  Good peripheral perfusion. Tachycardia. Resp:  Normal effort. Lungs clear. Abd:  No distention. Minimally tender to palpation.    ED Results / Procedures / Treatments   Labs (all labs ordered are listed, but only abnormal results are displayed) Labs Reviewed  COMPREHENSIVE METABOLIC PANEL - Abnormal; Notable for the following components:      Result Value   Sodium 128 (*)    Potassium 5.8 (*)    Chloride 93 (*)    CO2 17 (*)    Glucose, Bld 174 (*)    BUN 50 (*)    Creatinine, Ser 1.89 (*)    Calcium 8.5 (*)    Total  Protein 6.0 (*)    Albumin 2.2 (*)    AST 306 (*)    ALT 156 (*)    Alkaline Phosphatase 968 (*)    Total Bilirubin 7.1 (*)    GFR, Estimated 36 (*)    Anion gap 18 (*)    All other components within normal limits  LACTIC ACID, PLASMA - Abnormal; Notable for the following components:   Lactic Acid, Venous 6.2 (*)    All other components within normal limits  LACTIC ACID, PLASMA - Abnormal; Notable for the following components:   Lactic Acid, Venous 5.5 (*)    All other components within normal limits  CBC WITH DIFFERENTIAL/PLATELET - Abnormal; Notable for the following components:   WBC 17.9 (*)    MCV 79.8 (*)    RDW 20.4 (*)    Neutro Abs 15.1 (*)    Monocytes Absolute 1.5 (*)    Abs Immature Granulocytes 0.13 (*)  All other components within normal limits  PROTIME-INR - Abnormal; Notable for the following components:   Prothrombin Time 17.9 (*)    INR 1.5 (*)    All other components within normal limits  URINALYSIS, W/ REFLEX TO CULTURE (INFECTION SUSPECTED) - Abnormal; Notable for the following components:   Color, Urine AMBER (*)    APPearance CLOUDY (*)    Glucose, UA 50 (*)    Hgb urine dipstick SMALL (*)    Bilirubin Urine SMALL (*)    Protein, ur 30 (*)    All other components within normal limits  CBC - Abnormal; Notable for the following components:   WBC 17.8 (*)    RDW 20.0 (*)    All other components within normal limits  BASIC METABOLIC PANEL - Abnormal; Notable for the following components:   Sodium 130 (*)    Potassium 5.8 (*)    Chloride 95 (*)    CO2 21 (*)    Glucose, Bld 177 (*)    BUN 51 (*)    Creatinine, Ser 1.97 (*)    Calcium 8.2 (*)    GFR, Estimated 35 (*)    All other components within normal limits  CBG MONITORING, ED - Abnormal; Notable for the following components:   Glucose-Capillary 152 (*)    All other components within normal limits  CULTURE, BLOOD (ROUTINE X 2)  CULTURE, BLOOD (ROUTINE X 2)  URINE CULTURE  PROTIME-INR   CORTISOL-AM, BLOOD  HEMOGLOBIN A1C  PROCALCITONIN  BASIC METABOLIC PANEL  BASIC METABOLIC PANEL  BASIC METABOLIC PANEL     EKG  I, Phineas Semen, attending physician, personally viewed and interpreted this EKG  EKG Time: 1510 Rate: 139 Rhythm: atrial flutter with 2:1 conduction Axis: left axis deviation Intervals: qtc 505 QRS: narrow, q waves v1, III, aVF, V3 ST changes: no st elevation Impression: abnormal ekg    RADIOLOGY RUQ US  IMPRESSION:  1. Multiple liver masses, consistent with metastatic disease. See  previously performed cross-sectional imaging for further discussion.  2. Small volume abdominal ascites.  3. Gallstones and sludge within contracted gallbladder.  4. Gallbladder wall thickening, likely secondary to liver disease.    PROCEDURES:  Critical Care performed: Yes  CRITICAL CARE Performed by: Phineas Semen   Total critical care time: 30 minutes  Critical care time was exclusive of separately billable procedures and treating other patients.  Critical care was necessary to treat or prevent imminent or life-threatening deterioration.  Critical care was time spent personally by me on the following activities: development of treatment plan with patient and/or surrogate as well as nursing, discussions with consultants, evaluation of patient's response to treatment, examination of patient, obtaining history from patient or surrogate, ordering and performing treatments and interventions, ordering and review of laboratory studies, ordering and review of radiographic studies, pulse oximetry and re-evaluation of patient's condition.   Procedures    MEDICATIONS ORDERED IN ED: Medications - No data to display   IMPRESSION / MDM / ASSESSMENT AND PLAN / ED COURSE  I reviewed the triage vital signs and the nursing notes.                              Differential diagnosis includes, but is not limited to, dehydration, infection,  arrythmia  Patient's presentation is most consistent with acute presentation with potential threat to life or bodily function.   The patient is on the cardiac monitor to evaluate for evidence  of arrhythmia and/or significant heart rate changes.  Patient presented to the emergency department today from IR clinic where he was supposed to get a port placement because of concerns for tachycardia and low blood pressure.  EKG here is concerning for possible atrial flutter.  Patient denies history of arrhythmia.  Blood work does show leukocytosis as well as lactic acidosis.  Patient was recently diagnosed with a UTI although has not been taking antibiotics as prescribed.  At this time will start IV fluids and broad-spectrum antibiotics.  Lactic acid did improve after fluid. However heart rate continued to be elevated. At this time do think it is likely related to underlying arrhythmia. Did try medication to slow the heart rate down without any significant improvement. Discussed with Dr. Para March with the hospitalist service who will evaluate for admission.      FINAL CLINICAL IMPRESSION(S) / ED DIAGNOSES   Final diagnoses:  Cardiac arrhythmia, unspecified cardiac arrhythmia type  Lactic acidosis  Lower urinary tract infectious disease      Note:  This document was prepared using Dragon voice recognition software and may include unintentional dictation errors.    Phineas Semen, MD 06/16/2023 [phone]

## 2023-06-04 NOTE — H&P (Signed)
History and Physical    Patient: Jordan [name] UJW:[id number] DOB: [birth year] DOA: 07/03/2023 DOS: the patient was seen and examined on 06/07/2023 PCP: Marina Goodell, MD  Patient coming from: Home  Chief Complaint:  Chief Complaint  Patient presents with   Urinary Tract Infection    HPI: Jordan [name] is a 77 y.o. 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  Patient had an E. coli UTI diagnosed on 10/11 patient was seen in the ED on 10/28 for RUQ pain and workup with CT was about the same as prior findings.  He did received an NS bolus for hyponatremia of 128 and AKI of 1.89.  WBC 17,000 but he did not meet sepsis criteria.  He was started on Keflex for UTI (had admission for urosepsis March 2024) but he has only taken 1 dose due to poor oral intake and generally feeling unwell.  He has vague abdominal pain.  Denies vomiting, diarrhea, constipation or dysuria.  His main complaint is generalized malaise. ED course and data review: On arrival hypotensive to 89/60 and tachycardic to 136, tachypneic to 24. EKG #1 showed a flutter at 139 EKG #2 following hydration: Showed sinus at 134 with no acute ST-T wave changes (personally reviewed and interpreted Labs: WBC 17.8 stable from about 4 days prior, lactic acid 5.2--5.5 BMP #1: Notable for creatinine of 1.89 with anion gap of 18, bicarb 17 sodium 128-stable from findings at ED visit 2 days prior BMP #2 following hydration: Creatinine 1.97 with normal anion gap, bicarb 21, potassium 5.8 and sodium 130 Urinalysis cloudy but otherwise not consistent with UTI EKG #1 showed a flutter at 139 EKG #2 following hydration: Showed sinus at 134 with no acute ST-T wave changes (personally reviewed and  interpreted Labs: WBC 17.8 stable from about 4 days prior, lactic acid 5.2--5.5 BMP #1: Notable for creatinine of 1.89 with anion gap of 18, bicarb 17 sodium 128-stable from findings at ED visit 2 days prior BMP #2 following hydration: Creatinine 1.97 with normal anion gap, bicarb 21, potassium 5.8 and sodium 130 Urinalysis cloudy but otherwise not consistent with UTI Imaging:Chest x-ray no evidence of pneumonia or edema Right upper quadrant ultrasound multiple liver masses consistent with metastatic disease, small volume abdominal ascites.  Gallbladder sludge.  CBD diameter 5.2 mm and no sonographic Murphy sign.  Gallbladder wall thickening seen.  Chest x-ray no evidence of pneumonia or edema  Right upper quadrant ultrasound multiple liver masses consistent with metastatic disease, small volume abdominal ascites.  Gallbladder sludge.  CBD diameter 5.2 mm and no sonographic Murphy sign.  Gallbladder wall thickening seen.  Of note, patient had an MRCP on 06/03/23 ordered by his oncologist to rule out any biliary obstruction that showed the following: IMPRESSION: 1. The liver is grossly enlarged by numerous bulky rim enhancing, internally hypoenhancing liver metastases as seen on prior examinations, not significantly changed. 2. No biliary ductal dilatation. 3. Enlarged portacaval and porta hepatis lymph nodes measuring up to 4.0 x 1.6 cm. Prominent subcentimeter retroperitoneal lymph nodes. Findings are suspicious for nodal metastatic disease. 4. Diffusely heterogeneous enhancement of the included vertebral bodies, suspicious for osseous metastatic disease. 5. Small volume perihepatic and perisplenic ascites, similar to prior examination. Trace right pleural effusion, unchanged. 6. Lobulated  cortical scarring of the right kidney with prominence of the right renal pelvis and associated mucosal thickening, similar to prior examination. No obvious calculi or hydronephrosis. 7.  Cholelithiasis.   ED treatment Patient started on sepsis fluids, cefepime and vancomycin and Flagyl for sepsis of abdominal source Hospitalist consulted for admission  Patient started on sepsis fluids, cefepime and vancomycin and Flagyl for sepsis of abdominal source Hospitalist consulted for admission   Review of Systems: As mentioned in the history of present illness. All other systems reviewed and are negative.  Past Medical History:  Diagnosis Date   Anemia    Arthritis    Coronary artery disease    Diabetes mellitus without complication (HCC)    Diverticulosis    Duodenal ulcer    Erectile dysfunction    GERD (gastroesophageal reflux disease)    Hyperlipidemia    Hypertension    Past Surgical History:  Procedure Laterality Date   CARDIAC CATHETERIZATION     COLONOSCOPY WITH PROPOFOL N/A 05/31/2017   Procedure: COLONOSCOPY WITH PROPOFOL;  Surgeon: Scot Jun, MD;  Location: [facility] ENDOSCOPY;  Service: Endoscopy;  Laterality: N/A;   JOINT REPLACEMENT Left    knee   THORACIC DISCECTOMY N/A 08/09/2019   Procedure: T11-12 POSTERIOR FUSION WITH TRANSPEDICULAR DECOMPRESSION;  Surgeon: Venetia Night, MD;  Location: [facility] ORS;  Service: Neurosurgery;  Laterality: N/A;   VASECTOMY     Social History:  reports that he quit smoking about 35 years ago. His smoking use included cigarettes. He has never used smokeless tobacco. He reports that he does not drink alcohol and does not use drugs.  Allergies  Allergen Reactions   Actos [Pioglitazone] Hives   Amoxicillin Hives and Rash    Family History  Problem Relation Age of Onset   Hypertension Mother     Prior to Admission medications   Medication Sig Start Date End Date Taking? Authorizing Provider  amLODipine (NORVASC) 10 MG tablet Take 10 mg by mouth daily.    [provider]  ascorbic acid (VITAMIN C) 1000 MG tablet Take 1,000 mg by mouth daily.    [provider]  atorvastatin (LIPITOR) 20 MG  tablet Take 20 mg by mouth daily.    [provider]  carvedilol (COREG) 3.125 MG tablet Take 3.125 mg by mouth 2 (two) times daily. Patient not taking: Reported on 05/27/2023 10/28/21   [provider]  cephALEXin (KEFLEX) 500 MG capsule Take 1 capsule (500 mg total) by mouth 3 (three) times daily for 7 days. 05/31/23 06/07/23  Trinna Post, MD  cholecalciferol (VITAMIN D) 25 MCG (1000 UT) tablet Take 1,000 Units by mouth daily.    [provider]  CINNAMON PO Take 1,000 mg by mouth daily.     [provider]  glipiZIDE (GLUCOTROL) 5 MG tablet Take 5 mg by mouth daily. 07/17/19   [provider]  hydrochlorothiazide (HYDRODIURIL) 25 MG tablet Take 25 mg by mouth daily.    [provider]  magnesium oxide (MAG-OX) 400 MG tablet Take 400 mg by mouth daily.    [provider]  meloxicam (MOBIC) 15 MG tablet Take 1 tablet (15 mg total) by mouth daily. Take with food. 04/20/23 04/19/24  Drake Leach, PA-C  metFORMIN (GLUCOPHAGE) 1000 MG tablet Take 1,000 mg by mouth 2 (two) times daily with a meal.    [provider]  omega-3 acid ethyl esters (LOVAZA) 1 g capsule Take 1 capsule by mouth 2 (two) times daily.    [provider]  ramipril (ALTACE) 10 MG capsule Take 10 mg by mouth 2 (two) times daily.     [provider]  Semaglutide, 2 MG/DOSE, 8 MG/3ML SOPN Inject into the skin. 08/24/22   [provider]  traMADol (ULTRAM) 50 MG tablet Take 1 tablet (50 mg total) by mouth every 8 (eight) hours as needed. 06/01/23   Michaelyn Barter, MD  vitamin B-12 (CYANOCOBALAMIN) 1000 MCG tablet Take 1,000 mcg by mouth daily.    [provider]    Physical Exam: Vitals:   06/20/2023 1844 06/27/2023 1900 06/27/2023 1930 07/03/2023 2000  BP:  122/83 (!) 118/91 112/84  Pulse: (!) 131 (!) 134 (!) 136 (!) 139  Resp: (!) 21 (!) 24 19 18   Temp:      TempSrc:      SpO2: 100% 98% 100% 93%   Physical Exam  Labs on Admission:  I have personally reviewed following labs and imaging studies  CBC: Recent Labs  Lab 05/31/23 1314 06/01/23 1139 06/25/2023 1520 06/28/2023 2102  WBC 17.6* 17.2* 17.9* 17.8*  NEUTROABS  --  14.6* 15.1*  --   HGB 14.5 14.4 15.2 13.9  HCT 42.3 42.7 45.0 40.6  MCV 79.5* 80.6 79.8* 80.6  PLT 422* 420* 329 300   Basic Metabolic Panel: Recent Labs  Lab 05/31/23 1227 06/01/23 1139 06/08/2023 1520 06/20/2023 2102  NA 128* 129* 128* 130*  K 5.3* 5.1 5.8* 5.8*  CL 91* 92* 93* 95*  CO2 23 21* 17* 21*  GLUCOSE 182* 151* 174* 177*  BUN 37* 45* 50* 51*  CREATININE 1.89* 2.05* 1.89* 1.97*  CALCIUM 8.3* 8.5* 8.5* 8.2*   GFR: Estimated Creatinine Clearance: 39.5 mL/min (A) (by C-G formula based on SCr of 1.97 mg/dL (H)). Liver Function Tests: Recent Labs  Lab 06/01/23 1139 06/28/2023 1520  AST 293* 306*  ALT 125* 156*  ALKPHOS 820* 968*  BILITOT 5.2* 7.1*  PROT 6.4* 6.0*  ALBUMIN 2.4* 2.2*   No results for input(s): "LIPASE", "AMYLASE" in the last 168 hours. No results for input(s): "AMMONIA" in the last 168 hours. Coagulation Profile: Recent Labs  Lab 07/02/2023 1520  INR 1.5*   Cardiac Enzymes: No results for input(s): "CKTOTAL", "CKMB", "CKMBINDEX", "TROPONINI" in the last 168 hours. BNP (last 3 results) No results for input(s): "PROBNP" in the last 8760 hours. HbA1C: No results for input(s): "HGBA1C" in the last 72 hours. CBG: Recent Labs  Lab 06/07/2023 1407 06/22/2023 2029  GLUCAP 179* 152*   Lipid Profile: No results for input(s): "CHOL", "HDL", "LDLCALC", "TRIG", "CHOLHDL", "LDLDIRECT" in the last 72 hours. Thyroid Function Tests: No results for input(s): "TSH", "T4TOTAL", "FREET4", "T3FREE", "THYROIDAB" in the last 72 hours. Anemia Panel: No results for input(s): "VITAMINB12", "FOLATE", "FERRITIN", "TIBC", "IRON", "RETICCTPCT" in the last 72 hours. Urine analysis:    Component Value Date/Time   COLORURINE AMBER (A) 06/26/2023 1716   APPEARANCEUR CLOUDY (A)  06/29/2023 1716   LABSPEC 1.024 06/22/2023 1716   PHURINE 5.0 06/07/2023 1716   GLUCOSEU 50 (A) 06/05/2023 1716   HGBUR SMALL (A) 06/23/2023 1716   BILIRUBINUR SMALL (A) 07/02/2023 1716   KETONESUR NEGATIVE 06/05/2023 1716   PROTEINUR 30 (A) 06/14/2023 1716   NITRITE NEGATIVE 06/25/2023 1716   LEUKOCYTESUR NEGATIVE 06/08/2023 1716    Radiological Exams on Admission: US Abdomen Limited RUQ (LIVER/GB)  Result Date: 06/29/2023 CLINICAL DATA:  Right upper quadrant pain EXAM: ULTRASOUND ABDOMEN LIMITED RIGHT UPPER QUADRANT COMPARISON:  Abdominal MRI dated June 03, 2023 FINDINGS: Gallbladder: Gallstones and  sludge within contracted gallbladder. Gallbladder wall thickening, measuring up to 6 mm. No sonographic Murphy sign noted by sonographer. Common bile duct: Diameter: 5.2 mm Liver: Multiple hypoattenuating solid liver lesions. Reference lesion of the right hepatic lobe measuring 5.6 x 3.9 x 6.4 cm. Reference lesion of the left hepatic lobe measuring 3.1 x 3.0 x 3.6 cm. Nodular liver contour, likely due to liver lesions. Increased parenchymal echogenicity. Portal vein is patent on color Doppler imaging with normal direction of blood flow towards the liver. Other: Small volume abdominal ascites. IMPRESSION: 1. Multiple liver masses, consistent with metastatic disease. See previously performed cross-sectional imaging for further discussion. 2. Small volume abdominal ascites. 3. Gallstones and sludge within contracted gallbladder. 4. Gallbladder wall thickening, likely secondary to liver disease. Electronically Signed   By: Allegra Lai M.D.   On: 07/03/2023 21:15   DG Chest Port 1 View  Result Date: 06/12/2023 CLINICAL DATA:  Sepsis workup.  Hypotension and tachycardia. EXAM: PORTABLE CHEST 1 VIEW COMPARISON:  CT 05/18/2023.  Radiographs 12/04/2021. FINDINGS: 1541 hours. The heart size and mediastinal contours are stable with aortic atherosclerosis. There is asymmetric elevation of the right  hemidiaphragm with mild bibasilar atelectasis. No edema, confluent airspace disease, pleural effusion or pneumothorax. Postsurgical changes noted within the lower thoracic spine. No acute osseous findings are seen. IMPRESSION: Mild bibasilar atelectasis. No evidence of pneumonia or edema. Electronically Signed   By: Carey Bullocks M.D.   On: 06/30/2023 18:16   MR ABDOMEN MRCP W WO CONTAST  Result Date: 06/03/2023 CLINICAL DATA:  Elevated liver enzymes, hepatic metastatic disease, history of liver biopsy, worsening abdominal pain, follow-up recent CT EXAM: MRI ABDOMEN WITHOUT AND WITH CONTRAST (INCLUDING MRCP) TECHNIQUE: Multiplanar multisequence MR imaging of the abdomen was performed both before and after the administration of intravenous contrast. Heavily T2-weighted images of the biliary and pancreatic ducts were obtained, and three-dimensional MRCP images were rendered by post processing. CONTRAST:  10mL GADAVIST GADOBUTROL 1 MMOL/ML IV SOLN COMPARISON:  CT abdomen pelvis, 05/31/2023 FINDINGS: Lower chest: Trace right pleural effusion, unchanged. Hepatobiliary: Hepatomegaly, maximum coronal span 24.2 cm. The liver is grossly enlarged by numerous bulky rim enhancing, internally hypoenhancing liver metastases as seen on prior examinations, not significantly changed. Large index lesion of the peripheral right lobe of the liver, hepatic segments VI/VII measures 10.0 x 5.5 cm (series 27, image 32). Gallstones and sludge contracted in the gallbladder. No intra or extrahepatic biliary ductal dilatation. Pancreas: Unremarkable. No pancreatic ductal dilatation or surrounding inflammatory changes. Spleen: Normal in size without significant abnormality. Adrenals/Urinary Tract: Adrenal glands are unremarkable. Multiple simple, benign and thinly septated renal cortical cysts, for which no specific further follow-up or characterization is required. Lobulated cortical scarring of the right kidney with prominence of the  right renal pelvis and associated mucosal thickening, similar to prior examination (series 29, image 65). No obvious calculi or hydronephrosis. Stomach/Bowel: Stomach is within normal limits. No evidence of bowel wall thickening, distention, or inflammatory changes. Vascular/Lymphatic: Aortic atherosclerosis. Enlarged portacaval and porta hepatis lymph nodes measuring up to 4.0 x 1.6 cm (series 27, image 49). Prominent subcentimeter retroperitoneal lymph nodes (series 29, image 59). Other: No abdominal wall hernia or abnormality. Small volume perihepatic and perisplenic ascites, similar to prior examination. Musculoskeletal: Diffusely heterogeneous enhancement of the included vertebral bodies. IMPRESSION: 1. The liver is grossly enlarged by numerous bulky rim enhancing, internally hypoenhancing liver metastases as seen on prior examinations, not significantly changed. 2. No biliary ductal dilatation. 3. Enlarged portacaval and porta hepatis lymph  nodes measuring up to 4.0 x 1.6 cm. Prominent subcentimeter retroperitoneal lymph nodes. Findings are suspicious for nodal metastatic disease. 4. Diffusely heterogeneous enhancement of the included vertebral bodies, suspicious for osseous metastatic disease. 5. Small volume perihepatic and perisplenic ascites, similar to prior examination. Trace right pleural effusion, unchanged. 6. Lobulated cortical scarring of the right kidney with prominence of the right renal pelvis and associated mucosal thickening, similar to prior examination. No obvious calculi or hydronephrosis. 7. Cholelithiasis. Aortic Atherosclerosis (ICD10-I70.0). Electronically Signed   By: Jearld Lesch M.D.   On: 06/03/2023 10:02   MR 3D Recon At Scanner  Result Date: 06/03/2023 CLINICAL DATA:  Elevated liver enzymes, hepatic metastatic disease, history of liver biopsy, worsening abdominal pain, follow-up recent CT EXAM: MRI ABDOMEN WITHOUT AND WITH CONTRAST (INCLUDING MRCP) TECHNIQUE: Multiplanar  multisequence MR imaging of the abdomen was performed both before and after the administration of intravenous contrast. Heavily T2-weighted images of the biliary and pancreatic ducts were obtained, and three-dimensional MRCP images were rendered by post processing. CONTRAST:  10mL GADAVIST GADOBUTROL 1 MMOL/ML IV SOLN COMPARISON:  CT abdomen pelvis, 05/31/2023 FINDINGS: Lower chest: Trace right pleural effusion, unchanged. Hepatobiliary: Hepatomegaly, maximum coronal span 24.2 cm. The liver is grossly enlarged by numerous bulky rim enhancing, internally hypoenhancing liver metastases as seen on prior examinations, not significantly changed. Large index lesion of the peripheral right lobe of the liver, hepatic segments VI/VII measures 10.0 x 5.5 cm (series 27, image 32). Gallstones and sludge contracted in the gallbladder. No intra or extrahepatic biliary ductal dilatation. Pancreas: Unremarkable. No pancreatic ductal dilatation or surrounding inflammatory changes. Spleen: Normal in size without significant abnormality. Adrenals/Urinary Tract: Adrenal glands are unremarkable. Multiple simple, benign and thinly septated renal cortical cysts, for which no specific further follow-up or characterization is required. Lobulated cortical scarring of the right kidney with prominence of the right renal pelvis and associated mucosal thickening, similar to prior examination (series 29, image 65). No obvious calculi or hydronephrosis. Stomach/Bowel: Stomach is within normal limits. No evidence of bowel wall thickening, distention, or inflammatory changes. Vascular/Lymphatic: Aortic atherosclerosis. Enlarged portacaval and porta hepatis lymph nodes measuring up to 4.0 x 1.6 cm (series 27, image 49). Prominent subcentimeter retroperitoneal lymph nodes (series 29, image 59). Other: No abdominal wall hernia or abnormality. Small volume perihepatic and perisplenic ascites, similar to prior examination. Musculoskeletal: Diffusely  heterogeneous enhancement of the included vertebral bodies. IMPRESSION: 1. The liver is grossly enlarged by numerous bulky rim enhancing, internally hypoenhancing liver metastases as seen on prior examinations, not significantly changed. 2. No biliary ductal dilatation. 3. Enlarged portacaval and porta hepatis lymph nodes measuring up to 4.0 x 1.6 cm. Prominent subcentimeter retroperitoneal lymph nodes. Findings are suspicious for nodal metastatic disease. 4. Diffusely heterogeneous enhancement of the included vertebral bodies, suspicious for osseous metastatic disease. 5. Small volume perihepatic and perisplenic ascites, similar to prior examination. Trace right pleural effusion, unchanged. 6. Lobulated cortical scarring of the right kidney with prominence of the right renal pelvis and associated mucosal thickening, similar to prior examination. No obvious calculi or hydronephrosis. 7. Cholelithiasis. Aortic Atherosclerosis (ICD10-I70.0). Electronically Signed   By: Jearld Lesch M.D.   On: 06/03/2023 10:02     Data Reviewed: Relevant notes from primary care and specialist visits, past discharge summaries as available in EHR, including Care Everywhere. Prior diagnostic testing as pertinent to current admission diagnoses Updated medications and problem lists for reconciliation ED course, including vitals, labs, imaging, treatment and response to treatment Triage notes, nursing and pharmacy  notes and ED provider's notes Notable results as noted in HPI   Assessment and Plan: * Severe sepsis (HCC) Lactic acid, 5.2--> 5.8 UTI versus intra-abdominal source related to biliary disease History of E. 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) Elevated LFTs without ductal dilatation, likely  related to metastatic disease -MRI 10/28: Mets to liver and bone and nodes with unknown primary - Ultrasound with CBD of 5.21 and no Murphy sign -Last oncology note 10/28 reviewed: "Worsening transaminitis is potentially related to worsening metastatic liver disease ...stage IV cancer which unfortunately cannot be cured. Goal of any treatment would be palliative"  -Patient is scheduled for PET scan on 11/4 to identify primary  Acute renal failure superimposed on stage 3a chronic kidney disease (HCC) Anion gap metabolic acidosis Hyperkalemia Creatinine 1.97, up from 1.3 few weeks prior Likely prerenal in related to poor oral intake and sepsis Continue sepsis fluids and monitor Monitor potassium 4 and correct as needed Lokelma x 1 dose ordered potassium of 5.8 I did not improve following initial hydration in the ED    Latest Ref Rng & Units 06/12/2023    9:02 PM 06/22/2023    3:20 PM 06/01/2023   11:39 AM  BMP  Glucose 70 - 99 mg/dL 130  865  784   BUN 8 - 23 mg/dL 51  50  45   Creatinine 0.61 - 1.24 mg/dL 6.96  2.95  2.84   Sodium 135 - 145 mmol/L 130  128  129   Potassium 3.5 - 5.1 mmol/L 5.8  5.8  5.1   Chloride 98 - 111 mmol/L 95  93  92   CO2 22 - 32 mmol/L 21  17  21    Calcium 8.9 - 10.3 mg/dL 8.2  8.5  8.5       Essential hypertension Hypotension Patient hypotensive likely related to sepsis vs SIRS  Hold antihypertensive, ramipril, hydrochlorothiazide, carvedilol and amlodipine  Type 2 diabetes mellitus (HCC) Sliding scale insulin coverage Patient is currently on Ozempic, metformin and glipizide which will be on hold  CAD (coronary artery disease) Continue atorvastatin Holding carvedilol, ramipril due to soft blood pressure related to sepsis  Cancer related pain Continue home meds for pain relief.  Currently on tramadol    DVT prophylaxis: Lovenox  Consults: none  Advance Care Planning:   Code Status: Full Code   Family Communication: none  Disposition  Plan: Back to previous home environment  Severity of Illness: The appropriate patient status for this patient is INPATIENT. Inpatient status is judged to be reasonable and necessary in order to provide the required intensity of service to ensure the patient's safety. The patient's presenting symptoms, physical exam findings, and initial radiographic and laboratory data in the context of their chronic comorbidities is felt to place them at high risk for further clinical deterioration. Furthermore, it is not anticipated that the patient will be medically stable for discharge from the hospital within 2 midnights of admission.   * I certify that at the point of admission it is my clinical judgment that the patient will require inpatient hospital care spanning beyond 2 midnights from the point of admission due to high intensity of service, high risk for further deterioration and high frequency of surveillance required.*  Author: Andris Baumann, MD 06/23/2023 9:47 PM  For  on call review [email or web address].

## 2023-06-04 NOTE — Progress Notes (Signed)
Reviewed his chart. He has stage 4 cancer that is not curable.   Discussed that I am not sure how much pain is lumbar mediated and how much pain is cancer mediated. Not sure he would see much benefit from lumbar ESI.   He has appointment with palliative care on Wednesday. Will follow up with patient after that visit.

## 2023-06-04 NOTE — ED Notes (Signed)
PT is in extreme tachy per tele montior, HR shows 138, pulse 138, PO 98. RN is aware.

## 2023-06-04 NOTE — Assessment & Plan Note (Signed)
Stable, no chest pain --Hold atorvastatin with rising LFT's --Holding carvedilol, ramipril due to soft BP's and sepsis

## 2023-06-04 NOTE — Assessment & Plan Note (Addendum)
Anion gap metabolic acidosis Hyperkalemia Creatinine 1.97, up from 1.3 few weeks prior Likely prerenal in related to poor oral intake and sepsis Continue sepsis fluids and monitor Monitor potassium 4 and correct as needed Lokelma x 1 dose ordered potassium of 5.8 I did not improve following initial hydration in the ED    Latest Ref Rng & Units 06/14/2023    9:02 PM 06/14/2023    3:20 PM 06/01/2023   11:39 AM  BMP  Glucose 70 - 99 mg/dL 409  811  914   BUN 8 - 23 mg/dL 51  50  45   Creatinine 0.61 - 1.24 mg/dL 7.82  9.56  2.13   Sodium 135 - 145 mmol/L 130  128  129   Potassium 3.5 - 5.1 mmol/L 5.8  5.8  5.1   Chloride 98 - 111 mmol/L 95  93  92   CO2 22 - 32 mmol/L 21  17  21    Calcium 8.9 - 10.3 mg/dL 8.2  8.5  8.5

## 2023-06-04 NOTE — ED Notes (Signed)
PT has a fast HR around 140. RN is aware.

## 2023-06-04 NOTE — Progress Notes (Signed)
Pt. Tx to ER now via w/c escort per IR/Oncology recommendation . Stable for tx to ER . Wife escorted pt. And RN to ER.

## 2023-06-04 NOTE — Assessment & Plan Note (Signed)
 Continue home tramadol.

## 2023-06-04 NOTE — Progress Notes (Signed)
PHARMACIST - PHYSICIAN COMMUNICATION  CONCERNING:  Enoxaparin (Lovenox) for DVT Prophylaxis    RECOMMENDATION: Patient was prescribed enoxaprin 40mg  q24 hours for VTE prophylaxis.   There were no vitals filed for this visit.  There is no height or weight on file to calculate BMI.  Estimated Creatinine Clearance: 41.2 mL/min (A) (by C-G formula based on SCr of 1.89 mg/dL (H)).   Based on [facility] policy patient is candidate for enoxaparin 0.5mg /kg TBW SQ every 24 hours based on BMI being >30.  DESCRIPTION: Pharmacy has adjusted enoxaparin dose per [facility] policy.  Patient is now receiving enoxaparin 52.5 mg every 24 hours    Foye Deer, PharmD Clinical Pharmacist  06/05/2023 8:17 PM

## 2023-06-04 NOTE — Progress Notes (Signed)
  Pharmacy Antibiotic Note  Jordan [name] is a 77 y.o. male admitted on 06/15/2023 with  possible intra-abdominal infection .  Pharmacy has been consulted for Cefepime and Vancomycin dosing.  Plan: Patient received Vancomycin 1000mg  IV in the ED will follow with Vancomycin 1250 mg IV Q 36 hrs. Goal AUC 400-550. Expected AUC: 466.6 SCr used: 1.89 Expected Cmin: 10.5  Cefepime 2g IV q12h  Follow SCr closely    Temp (24hrs), Avg:97.3 F (36.3 C), Min:96.7 F (35.9 C), Max:97.8 F (36.6 C)  Recent Labs  Lab 05/31/23 1227 05/31/23 1314 06/01/23 1139 06/30/2023 1520 06/14/2023 1716  WBC  --  17.6* 17.2* 17.9*  --   CREATININE 1.89*  --  2.05* 1.89*  --   LATICACIDVEN  --   --   --  6.2* 5.5*    Estimated Creatinine Clearance: 41.2 mL/min (A) (by C-G formula based on SCr of 1.89 mg/dL (H)).    Allergies  Allergen Reactions   Actos [Pioglitazone] Hives   Amoxicillin Hives and Rash    Antimicrobials this admission: Cefepime 11/1 >>  Vancomycin 11/1 >>  Metronidazole 11/1 >>  Dose adjustments this admission:  Microbiology results:  Thank you for allowing pharmacy to be a part of this patient's care.  Clovia Cuff, PharmD, BCPS 06/11/2023 8:30 PM

## 2023-06-04 NOTE — ED Notes (Signed)
EDP at bedside  

## 2023-06-04 NOTE — Assessment & Plan Note (Signed)
Sliding scale insulin coverage Patient is currently on Ozempic, metformin and glipizide which will be on hold

## 2023-06-04 NOTE — Assessment & Plan Note (Addendum)
Hypotension Patient hypotensive likely related to sepsis vs SIRS  Hold ramipril, hydrochlorothiazide, carvedilol and amlodipine 11/1 -- started low dose metop for tachycardia with less BP lowering effect vs coreg

## 2023-06-04 NOTE — Assessment & Plan Note (Signed)
Septic shock (due to Lactic acidosis) UTI ruled out  ?Intra-abdominal source related to biliary disease History of E. coli UTI 05/14/2023 Remains on Sepsis fluids for persistent lactic acidosis and tachycardia UTI ruled out - urine culture no growth --Continue Cefepime, Flagyl --D/C Vancomycin --Follow blood cultures --Pain control PRN --Trend lactic acid --Monitor fever curve, CBC, hemodynamics  Case discussed with on-call surgeon who reviewed RUQ [place] and MRCP.  Recommend HIDA scan if not improving on antibiotics, for consideration of perc biliary drain with IR if needed, but gallbladder contracted on U/S and no signs of cholecystitic on MRCP.  Felt gallbladder unlikely source of sepsis. --HIDA scan today - results pending --Resume diet post-scan

## 2023-06-04 NOTE — Assessment & Plan Note (Addendum)
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 Goal of any treatment would be palliative"  -Patient is scheduled for PET scan on 11/4 to identify primary --Oncology following --PET scan inpatient on Wed 11/6 --Consulted Palliative Care

## 2023-06-04 NOTE — H&P (Signed)
Chief Complaint: Patient was seen in consultation today for port a catheter placement for chemotherapy with recent diagnosis of adenocarcinoma at the request of Agrawal,Kavita  Referring Physician(s): Agrawal,Kavita  Supervising Physician: Marliss Coots  Patient Status: [facility] - Out-pt  History of Present Illness: Jordan [name] is a 77 y.o. 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 PET scan is ordered for 11/4 and patient has been seen by oncology with request received for port a catheter placement for palliative chemotherapy. Patient states he is feeling very weak and has not been able to have much if any oral intake, he only had 1 dose of his Keflex yesterday.   Past Medical History:  Diagnosis Date   Anemia    Arthritis    Coronary artery disease    Diabetes mellitus without complication (HCC)    Diverticulosis    Duodenal ulcer    Erectile dysfunction    GERD (gastroesophageal reflux disease)    Hyperlipidemia    Hypertension     Past Surgical History:  Procedure Laterality Date   CARDIAC CATHETERIZATION     COLONOSCOPY WITH PROPOFOL N/A 05/31/2017   Procedure: COLONOSCOPY WITH PROPOFOL;  Surgeon: Scot Jun, MD;  Location: [facility] ENDOSCOPY;  Service: Endoscopy;  Laterality: N/A;   JOINT REPLACEMENT Left    knee   THORACIC DISCECTOMY N/A 08/09/2019   Procedure: T11-12 POSTERIOR FUSION WITH TRANSPEDICULAR DECOMPRESSION;  Surgeon: Venetia Night, MD;  Location: [facility] ORS;  Service: Neurosurgery;  Laterality: N/A;   VASECTOMY      Allergies: Actos [pioglitazone] and Amoxicillin  Medications: Prior to Admission medications   Medication Sig Start Date End Date Taking? Authorizing Provider   amLODipine (NORVASC) 10 MG tablet Take 10 mg by mouth daily.    [provider]  ascorbic acid (VITAMIN C) 1000 MG tablet Take 1,000 mg by mouth daily.    [provider]  atorvastatin (LIPITOR) 20 MG tablet Take 20 mg by mouth daily.    [provider]  carvedilol (COREG) 3.125 MG tablet Take 3.125 mg by mouth 2 (two) times daily. Patient not taking: Reported on 05/27/2023 10/28/21   [provider]  cephALEXin (KEFLEX) 500 MG capsule Take 1 capsule (500 mg total) by mouth 3 (three) times daily for 7 days. 05/31/23 06/07/23  Trinna Post, MD  cholecalciferol (VITAMIN D) 25 MCG (1000 UT) tablet Take 1,000 Units by mouth daily.    [provider]  CINNAMON PO Take 1,000 mg by mouth daily.     [provider]  glipiZIDE (GLUCOTROL) 5 MG tablet Take 5 mg by mouth daily. 07/17/19   [provider]  hydrochlorothiazide (HYDRODIURIL) 25 MG tablet Take 25 mg by mouth daily.    [provider]  magnesium oxide (MAG-OX) 400 MG tablet Take 400 mg by mouth daily.    [provider]  meloxicam (MOBIC) 15 MG tablet Take 1 tablet (15 mg total) by mouth daily. Take with food. 04/20/23 04/19/24  Drake Leach, PA-C  metFORMIN (GLUCOPHAGE) 1000 MG tablet Take 1,000 mg by mouth 2 (two) times daily with a meal.    [provider]  omega-3 acid ethyl esters (LOVAZA) 1 g capsule Take 1 capsule by mouth 2 (two) times daily.    [provider]  ramipril (ALTACE) 10 MG capsule Take 10 mg by mouth 2 (two) times daily.     [provider]  Semaglutide, 2 MG/DOSE, 8 MG/3ML SOPN Inject into the skin. 08/24/22   [provider]  traMADol (ULTRAM) 50 MG tablet Take 1 tablet (50 mg total) by mouth every 8 (eight) hours as needed. 06/01/23   Michaelyn Barter, MD  vitamin B-12 (CYANOCOBALAMIN) 1000 MCG tablet Take 1,000 mcg by mouth daily.    [provider]     Family History  Problem Relation Age of Onset    Hypertension Mother     Social History   Socioeconomic History   Marital status: Married    Spouse name: Not on file   Number of children: Not on file   Years of education: Not on file   Highest education level: Not on file  Occupational History   Not on file  Tobacco Use   Smoking status: Former    Current packs/day: 0.00    Types: Cigarettes    Quit date: 11/02/1987    Years since quitting: 35.6   Smokeless tobacco: Never  Vaping Use   Vaping status: Never Used  Substance and Sexual Activity   Alcohol use: No   Drug use: No   Sexual activity: Not on file  Other Topics Concern   Not on file  Social History Narrative   Not on file   Social Determinants of Health   Financial Resource Strain: Not on file  Food Insecurity: Not on file  Transportation Needs: Not on file  Physical Activity: Unknown (01/11/2018)   Received from [facility]   Exercise Vital Sign    Days of Exercise per Week: 3 days    Minutes of Exercise per Session: Not on file  Stress: Not on file  Social Connections: Not on file    Review of Systems: A 12 point ROS discussed and pertinent positives are indicated in the HPI above.  All other systems are negative.  Review of Systems  Vital Signs: BP 90/74   Pulse (!) 135   Temp (!) 96.7 F (35.9 C) (Axillary)   Resp 19   Ht 5\' 11"  (1.803 m)   Wt 233 lb (105.7 kg)   SpO2 98%   BMI 32.50 kg/m   Physical Exam Constitutional:      Appearance: He is ill-appearing.  HENT:     Head: Normocephalic and atraumatic.  Cardiovascular:     Rate and Rhythm: Regular rhythm. Tachycardia present.  Pulmonary:     Effort: Pulmonary effort is normal. No respiratory distress.  Skin:    Coloration: Skin is pale.  Neurological:     Mental Status: He is alert and oriented to person, place, and time.     Imaging: MR ABDOMEN MRCP W WO CONTAST  Result Date: 06/03/2023 CLINICAL DATA:  Elevated liver enzymes, hepatic metastatic disease,  history of liver biopsy, worsening abdominal pain, follow-up recent CT EXAM: MRI ABDOMEN WITHOUT AND WITH CONTRAST (INCLUDING MRCP) TECHNIQUE: Multiplanar multisequence MR imaging of the abdomen was performed both before and after the administration of intravenous contrast. Heavily T2-weighted images of the biliary and pancreatic ducts were obtained, and three-dimensional MRCP images were rendered by post processing. CONTRAST:  10mL GADAVIST GADOBUTROL 1 MMOL/ML IV SOLN COMPARISON:  CT abdomen pelvis, 05/31/2023 FINDINGS: Lower chest: Trace right pleural effusion, unchanged. Hepatobiliary: Hepatomegaly, maximum coronal span 24.2 cm. The liver is grossly enlarged by numerous bulky rim enhancing, internally hypoenhancing liver metastases as seen  on prior examinations, not significantly changed. Large index lesion of the peripheral right lobe of the liver, hepatic segments VI/VII measures 10.0 x 5.5 cm (series 27, image 32). Gallstones and sludge contracted in the gallbladder. No intra or extrahepatic biliary ductal dilatation. Pancreas: Unremarkable. No pancreatic ductal dilatation or surrounding inflammatory changes. Spleen: Normal in size without significant abnormality. Adrenals/Urinary Tract: Adrenal glands are unremarkable. Multiple simple, benign and thinly septated renal cortical cysts, for which no specific further follow-up or characterization is required. Lobulated cortical scarring of the right kidney with prominence of the right renal pelvis and associated mucosal thickening, similar to prior examination (series 29, image 65). No obvious calculi or hydronephrosis. Stomach/Bowel: Stomach is within normal limits. No evidence of bowel wall thickening, distention, or inflammatory changes. Vascular/Lymphatic: Aortic atherosclerosis. Enlarged portacaval and porta hepatis lymph nodes measuring up to 4.0 x 1.6 cm (series 27, image 49). Prominent subcentimeter retroperitoneal lymph nodes (series 29, image 59).  Other: No abdominal wall hernia or abnormality. Small volume perihepatic and perisplenic ascites, similar to prior examination. Musculoskeletal: Diffusely heterogeneous enhancement of the included vertebral bodies. IMPRESSION: 1. The liver is grossly enlarged by numerous bulky rim enhancing, internally hypoenhancing liver metastases as seen on prior examinations, not significantly changed. 2. No biliary ductal dilatation. 3. Enlarged portacaval and porta hepatis lymph nodes measuring up to 4.0 x 1.6 cm. Prominent subcentimeter retroperitoneal lymph nodes. Findings are suspicious for nodal metastatic disease. 4. Diffusely heterogeneous enhancement of the included vertebral bodies, suspicious for osseous metastatic disease. 5. Small volume perihepatic and perisplenic ascites, similar to prior examination. Trace right pleural effusion, unchanged. 6. Lobulated cortical scarring of the right kidney with prominence of the right renal pelvis and associated mucosal thickening, similar to prior examination. No obvious calculi or hydronephrosis. 7. Cholelithiasis. Aortic Atherosclerosis (ICD10-I70.0). Electronically Signed   By: Jearld Lesch M.D.   On: 06/03/2023 10:02   MR 3D Recon At Scanner  Result Date: 06/03/2023 CLINICAL DATA:  Elevated liver enzymes, hepatic metastatic disease, history of liver biopsy, worsening abdominal pain, follow-up recent CT EXAM: MRI ABDOMEN WITHOUT AND WITH CONTRAST (INCLUDING MRCP) TECHNIQUE: Multiplanar multisequence MR imaging of the abdomen was performed both before and after the administration of intravenous contrast. Heavily T2-weighted images of the biliary and pancreatic ducts were obtained, and three-dimensional MRCP images were rendered by post processing. CONTRAST:  10mL GADAVIST GADOBUTROL 1 MMOL/ML IV SOLN COMPARISON:  CT abdomen pelvis, 05/31/2023 FINDINGS: Lower chest: Trace right pleural effusion, unchanged. Hepatobiliary: Hepatomegaly, maximum coronal span 24.2 cm. The  liver is grossly enlarged by numerous bulky rim enhancing, internally hypoenhancing liver metastases as seen on prior examinations, not significantly changed. Large index lesion of the peripheral right lobe of the liver, hepatic segments VI/VII measures 10.0 x 5.5 cm (series 27, image 32). Gallstones and sludge contracted in the gallbladder. No intra or extrahepatic biliary ductal dilatation. Pancreas: Unremarkable. No pancreatic ductal dilatation or surrounding inflammatory changes. Spleen: Normal in size without significant abnormality. Adrenals/Urinary Tract: Adrenal glands are unremarkable. Multiple simple, benign and thinly septated renal cortical cysts, for which no specific further follow-up or characterization is required. Lobulated cortical scarring of the right kidney with prominence of the right renal pelvis and associated mucosal thickening, similar to prior examination (series 29, image 65). No obvious calculi or hydronephrosis. Stomach/Bowel: Stomach is within normal limits. No evidence of bowel wall thickening, distention, or inflammatory changes. Vascular/Lymphatic: Aortic atherosclerosis. Enlarged portacaval and porta hepatis lymph nodes measuring up to 4.0 x 1.6 cm (series 27, image 49).  Prominent subcentimeter retroperitoneal lymph nodes (series 29, image 59). Other: No abdominal wall hernia or abnormality. Small volume perihepatic and perisplenic ascites, similar to prior examination. Musculoskeletal: Diffusely heterogeneous enhancement of the included vertebral bodies. IMPRESSION: 1. The liver is grossly enlarged by numerous bulky rim enhancing, internally hypoenhancing liver metastases as seen on prior examinations, not significantly changed. 2. No biliary ductal dilatation. 3. Enlarged portacaval and porta hepatis lymph nodes measuring up to 4.0 x 1.6 cm. Prominent subcentimeter retroperitoneal lymph nodes. Findings are suspicious for nodal metastatic disease. 4. Diffusely heterogeneous  enhancement of the included vertebral bodies, suspicious for osseous metastatic disease. 5. Small volume perihepatic and perisplenic ascites, similar to prior examination. Trace right pleural effusion, unchanged. 6. Lobulated cortical scarring of the right kidney with prominence of the right renal pelvis and associated mucosal thickening, similar to prior examination. No obvious calculi or hydronephrosis. 7. Cholelithiasis. Aortic Atherosclerosis (ICD10-I70.0). Electronically Signed   By: Jearld Lesch M.D.   On: 06/03/2023 10:02   CT ABDOMEN PELVIS WO CONTRAST  Result Date: 05/31/2023 CLINICAL DATA:  Abdominal pain EXAM: CT ABDOMEN AND PELVIS WITHOUT CONTRAST TECHNIQUE: Multidetector CT imaging of the abdomen and pelvis was performed following the standard protocol without IV contrast. RADIATION DOSE REDUCTION: This exam was performed according to the departmental dose-optimization program which includes automated exposure control, adjustment of the mA and/or kV according to patient size and/or use of iterative reconstruction technique. COMPARISON:  05/07/2023 FINDINGS: Lower chest: Aortic and coronary artery atherosclerosis. Trace right pleural effusion. Hepatobiliary: Diffuse hypoattenuating masses throughout both lobes of the liver, increased in size compared to the recent prior CT. Liver measures approximately 24 cm in length. Sludge or small stones within the gallbladder. Gallbladder is partially contracted. Pancreas: No focal abnormality of the pancreas is identified on noncontrast imaging. No inflammatory changes. Spleen: Normal in size without focal abnormality. Adrenals/Urinary Tract: No adrenal nodules or masses. Urothelial thickening of the right renal pelvis is similar in appearance to prior. No stone or hydronephrosis. Urinary bladder within normal limits. Stomach/Bowel: Stomach is within normal limits. Colonic diverticulosis. No evidence of bowel wall thickening, distention, or inflammatory  changes. Vascular/Lymphatic: Aortoiliac atherosclerosis without aneurysm. Several small retroperitoneal lymph nodes are similar to prior. No abdominopelvic lymphadenopathy by size criteria. Reproductive: Prostate is unremarkable. Other: Small volume ascites is new from prior. No pneumoperitoneum. Small fat containing umbilical hernia. Musculoskeletal: There are several small sclerotic lesions throughout the spine, bilateral ribs, and pelvis, similar to slightly increased compared to prior. Reference lesion is a small sclerotic lesion within the T8 vertebral body (series 5, image 90). IMPRESSION: 1. Worsening hepatic metastatic disease. 2. Small volume ascites is new from prior. 3. Several small sclerotic lesions throughout the spine, bilateral ribs, and pelvis, similar to slightly increased compared to prior. Findings are compatible with osseous metastatic disease. 4. Urothelial thickening of the right renal pelvis is similar in appearance to prior. Correlate with urinalysis. 5. Trace right pleural effusion. 6. Colonic diverticulosis without evidence of diverticulitis. No bowel obstruction. 7. Aortic and coronary artery atherosclerosis (ICD10-I70.0). Electronically Signed   By: Duanne Guess D.O.   On: 05/31/2023 18:30   CT CHEST W CONTRAST  Result Date: 05/27/2023 CLINICAL DATA:  Liver metastases on recent CT abdomen. * Tracking Code: BO * EXAM: CT CHEST WITH CONTRAST TECHNIQUE: Multidetector CT imaging of the chest was performed during intravenous contrast administration. RADIATION DOSE REDUCTION: This exam was performed according to the departmental dose-optimization program which includes automated exposure control, adjustment of the mA and/or  kV according to patient size and/or use of iterative reconstruction technique. CONTRAST:  75mL OMNIPAQUE IOHEXOL 300 MG/ML  SOLN COMPARISON:  CT abdomen pelvis 05/07/2023 and CT chest 12/04/2021. FINDINGS: Cardiovascular: Atherosclerotic calcification of the aorta,  aortic valve and coronary arteries. Ascending aorta measures up to 3.9 cm (4/71). Enlarged pulmonic trunk and heart. No pericardial effusion. Mediastinum/Nodes: No pathologically enlarged mediastinal, hilar or axillary lymph nodes. Esophagus is grossly unremarkable. Lungs/Pleura: Lungs are clear. No pleural fluid. Airway is unremarkable. Upper Abdomen: Mildly hypoattenuating masses throughout the liver with index mass in the right hepatic lobe measuring 6.5 x 8.3 cm (2/145). Visualized portions of the gallbladder, adrenal glands and right kidney are otherwise grossly unremarkable. There may be a left renal sinus cyst, partially imaged. No specific follow-up necessary. Visualized portions of the spleen, pancreas, stomach and bowel are grossly unremarkable. Scattered ascites. Periportal adenopathy measures up to 1.5 cm. Musculoskeletal: Degenerative changes in the spine. T11-12 posterior fusion. Scattered sclerotic lesions in the visualized osseous structures, new from 12/04/2021. IMPRESSION: 1. Hepatic and osseous metastatic disease. No evidence of primary malignancy in the chest. 2. Scattered ascites. 3. Aortic atherosclerosis (ICD10-I70.0). Coronary artery calcification. 4. Enlarged pulmonic trunk, indicative of pulmonary arterial hypertension. Electronically Signed   By: Leanna Battles M.D.   On: 05/27/2023 13:45   US BIOPSY (LIVER)  Result Date: 05/27/2023 INDICATION: Multiple liver lesions with unknown primary malignancy. EXAM: ULTRASOUND GUIDED CORE BIOPSY OF LIVER MASS MEDICATIONS: None. ANESTHESIA/SEDATION: Moderate (conscious) sedation was employed during this procedure. A total of Versed 1.0 mg and Fentanyl 50 mcg was administered intravenously. Moderate Sedation Time: 10 minutes. The patient's level of consciousness and vital signs were monitored continuously by radiology nursing throughout the procedure under my direct supervision. PROCEDURE: The procedure, risks, benefits, and alternatives were  explained to the patient. Questions regarding the procedure were encouraged and answered. The patient understands and consents to the procedure. A time-out was performed prior to initiating the procedure. Ultrasound was performed to localize liver lesions. The abdominal wall was prepped with chlorhexidine in a sterile fashion, and a sterile drape was applied covering the operative field. A sterile gown and sterile gloves were used for the procedure. Local anesthesia was provided with 1% Lidocaine. A 17 gauge trocar needle was advanced into the liver at the level of a right lobe liver lesion under ultrasound guidance. After confirming needle tip position, coaxial 18 gauge core biopsy samples were obtained and submitted in formalin. Three total core biopsy samples were obtained. Gel-Foam pledgets were advanced through the outer needle prior to its removal. Additional ultrasound was performed. COMPLICATIONS: None immediate. FINDINGS: Ultrasound demonstrates multiple circumscribed and ill-defined lesions within the liver. A lesion in the inferior right lobe measuring up to approximately 4.8 cm in maximum diameter was chosen for sampling. Solid core biopsy samples were obtained. IMPRESSION: Ultrasound-guided core biopsy performed of a mass within the right lobe of the liver measuring up to approximately 4.8 cm in maximum diameter. Electronically Signed   By: Irish Lack M.D.   On: 05/27/2023 13:27   CT ABDOMEN PELVIS W CONTRAST  Result Date: 05/07/2023 CLINICAL DATA:  Abdominal pain for several weeks. Diverticulosis. Duodenal ulcer. * Tracking Code: BO * EXAM: CT ABDOMEN AND PELVIS WITH CONTRAST TECHNIQUE: Multidetector CT imaging of the abdomen and pelvis was performed using the standard protocol following bolus administration of intravenous contrast. RADIATION DOSE REDUCTION: This exam was performed according to the departmental dose-optimization program which includes automated exposure control, adjustment of  the mA and/or  kV according to patient size and/or use of iterative reconstruction technique. CONTRAST:  OMNIPAQUE IOHEXOL 300 MG/ML  SOLN COMPARISON:  12/04/2021 FINDINGS: Lower Chest: No acute findings. Hepatobiliary: Numerous hypovascular masses are seen throughout the liver which are new since previous study, consistent with diffuse liver metastases. Insert Pancreas:  No mass or inflammatory changes. Spleen: Within normal limits in size and appearance. Adrenals/Urinary Tract: No suspicious masses identified. Increased diffuse right renal parenchymal atrophy is seen since previous study, with significantly decreased pelvicaliectasis or parapelvic cyst. No evidence of ureteral calculi or dilatation. Unremarkable unopacified urinary bladder. Stomach/Bowel: No evidence of obstruction, inflammatory process or abnormal fluid collections. Diverticulosis is seen mainly involving the descending and sigmoid colon, however there is no evidence of diverticulitis. Normal appendix visualized. Vascular/Lymphatic: Shotty sub-cm lymph nodes are seen throughout the abdominal retroperitoneum none of which are pathologically enlarged. No pathologically enlarged lymph nodes. No acute vascular findings. Reproductive:  No mass or other significant abnormality. Other:  Small umbilical hernia is seen, which contains only fat. Musculoskeletal:  No suspicious bone lesions identified. IMPRESSION: Diffuse liver metastases. No primary malignancy identified within the abdomen or pelvis. Colonic diverticulosis, without radiographic evidence of diverticulitis. Increased diffuse right renal parenchymal atrophy, with significantly decreased pelvicaliectasis or parapelvic cyst. Small umbilical hernia, which contains only fat. Electronically Signed   By: Danae Orleans M.D.   On: 05/07/2023 15:34    Labs:  CBC: Recent Labs    05/14/23 1530 05/27/23 0943 05/31/23 1314 06/01/23 1139  WBC 11.8* 13.8* 17.6* 17.2*  HGB 13.7 15.0 14.5 14.4   HCT 41.9 46.5 42.3 42.7  PLT 424* 454* 422* 420*    COAGS: Recent Labs    05/14/23 1530 05/27/23 0943  INR 1.1 1.2  APTT 30  --     BMP: Recent Labs    05/14/23 1530 05/27/23 0943 05/31/23 1227 06/01/23 1139  NA 136 133* 128* 129*  K 4.1 4.8 5.3* 5.1  CL 100 95* 91* 92*  CO2 26 22 23  21*  GLUCOSE 165* 152* 182* 151*  BUN 15 22 37* 45*  CALCIUM 9.1 8.9 8.3* 8.5*  CREATININE 1.39* 1.51* 1.89* 2.05*  GFRNONAA 53* 48* 36* 33*    LIVER FUNCTION TESTS: Recent Labs    05/14/23 1530 05/27/23 0943 06/01/23 1139  BILITOT 1.3* 4.1* 5.2*  AST 94* 235* 293*  ALT 66* 100* 125*  ALKPHOS 681* 839* 820*  PROT 7.6 7.1 6.4*  ALBUMIN 3.1* 2.6* 2.4*    Assessment and Plan: This is a 77 year old male with PMHx significant for CAD, tobacco use, HTN, DM, GERD and recently diagnosed stage IV cancer after recent CT imaging for complaints of abdominal pain and urinary symptoms, CT revealed multiple liver lesions, sclerotic lesions and lymphadenopathy s/p liver lesion biopsy with our service on 10/24 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 PET scan is ordered for 11/4 and patient has been seen by oncology with request received for port a catheter placement for palliative chemotherapy.   The patient presented today for his port placement, however states he feels very weak and has had no improvement since his ED visit for a UTI 2 days ago. He is unable to take his oral antibiotic as prescribed because he is so weak and his oral intake is severely diminished. Given that he is tachycardic today with intermittent hypotension and feels very ill with little to no oral intake we recommend that the patient hold off on port placement today and report to the emergency  department. This plan was discussed with the patient's oncologist, Dr. Elby Showers, the patient and his family today who all agree with the above plan.   Patient will be discharged from Radiology and go to the emergency  department, ER charge RN is being contacted by our radiology RN.    Electronically Signed: Berneta Levins, PA-C 06/20/2023, 2:23 PM   I spent a total of 10 Minutes in face to face in clinical consultation, greater than 50% of which was counseling/coordinating care for port a catheter placement for chemotherapy with recent diagnosis of adenocarcinoma.

## 2023-06-04 NOTE — ED Triage Notes (Signed)
Pt came from home for port placement. IR brought him for possible sepsis workup. Nurse states he was hypotensive and tachycardic. States he has not been compliant with his ABX at home for UTI.

## 2023-06-04 NOTE — Progress Notes (Addendum)
Koreen, PA in at bedside to speak with pt. And wife. Pt. ST 130's, stating he has only taken a few tabs Of Keflex for "UTI." Pt. States he has not been eating well in last few days. Also c/o bilat. LE "weakness/heaviness" secondary to HX: back surgery."Dr. Elby Showers and oncology team made aware of pt. Status. Procedure cancelled for today.

## 2023-06-05 ENCOUNTER — Encounter: Payer: Self-pay | Admitting: Internal Medicine

## 2023-06-05 DIAGNOSIS — A419 Sepsis, unspecified organism: Secondary | ICD-10-CM

## 2023-06-05 DIAGNOSIS — R6520 Severe sepsis without septic shock: Secondary | ICD-10-CM

## 2023-06-05 DIAGNOSIS — E785 Hyperlipidemia, unspecified: Secondary | ICD-10-CM

## 2023-06-05 LAB — BASIC METABOLIC PANEL
Anion gap: 11 (ref 5–15)
Anion gap: 12 (ref 5–15)
Anion gap: 14 (ref 5–15)
Anion gap: 11 (ref 5–15)
BUN: 54 mg/dL — ABNORMAL HIGH (ref 8–23)
BUN: 54 mg/dL — ABNORMAL HIGH (ref 8–23)
BUN: 54 mg/dL — ABNORMAL HIGH (ref 8–23)
BUN: 57 mg/dL — ABNORMAL HIGH (ref 8–23)
CO2: 22 mmol/L (ref 22–32)
CO2: 22 mmol/L (ref 22–32)
CO2: 20 mmol/L — ABNORMAL LOW (ref 22–32)
CO2: 21 mmol/L — ABNORMAL LOW (ref 22–32)
Calcium: 7.8 mg/dL — ABNORMAL LOW (ref 8.9–10.3)
Calcium: 7.9 mg/dL — ABNORMAL LOW (ref 8.9–10.3)
Calcium: 8 mg/dL — ABNORMAL LOW (ref 8.9–10.3)
Calcium: 7.9 mg/dL — ABNORMAL LOW (ref 8.9–10.3)
Chloride: 96 mmol/L — ABNORMAL LOW (ref 98–111)
Chloride: 96 mmol/L — ABNORMAL LOW (ref 98–111)
Chloride: 97 mmol/L — ABNORMAL LOW (ref 98–111)
Chloride: 99 mmol/L (ref 98–111)
Creatinine, Ser: 1.94 mg/dL — ABNORMAL HIGH (ref 0.61–1.24)
Creatinine, Ser: 2 mg/dL — ABNORMAL HIGH (ref 0.61–1.24)
Creatinine, Ser: 1.87 mg/dL — ABNORMAL HIGH (ref 0.61–1.24)
Creatinine, Ser: 1.87 mg/dL — ABNORMAL HIGH (ref 0.61–1.24)
GFR, Estimated: 35 mL/min — ABNORMAL LOW (ref >60)
GFR, Estimated: 34 mL/min — ABNORMAL LOW (ref >60)
GFR, Estimated: 37 mL/min — ABNORMAL LOW (ref >60)
GFR, Estimated: 37 mL/min — ABNORMAL LOW (ref >60)
Glucose, Bld: 167 mg/dL — ABNORMAL HIGH (ref 70–99)
Glucose, Bld: 130 mg/dL — ABNORMAL HIGH (ref 70–99)
Glucose, Bld: 111 mg/dL — ABNORMAL HIGH (ref 70–99)
Glucose, Bld: 166 mg/dL — ABNORMAL HIGH (ref 70–99)
Potassium: 5.2 mmol/L — ABNORMAL HIGH (ref 3.5–5.1)
Potassium: 5 mmol/L (ref 3.5–5.1)
Potassium: 5 mmol/L (ref 3.5–5.1)
Potassium: 4.9 mmol/L (ref 3.5–5.1)
Sodium: 129 mmol/L — ABNORMAL LOW (ref 135–145)
Sodium: 130 mmol/L — ABNORMAL LOW (ref 135–145)
Sodium: 131 mmol/L — ABNORMAL LOW (ref 135–145)
Sodium: 131 mmol/L — ABNORMAL LOW (ref 135–145)

## 2023-06-05 LAB — BLOOD GAS, VENOUS
Acid-base deficit: 3 mmol/L — ABNORMAL HIGH (ref 0.0–2.0)
Bicarbonate: 21.1 mmol/L (ref 20.0–28.0)
O2 Saturation: 90.3 %
Patient temperature: 37
pCO2, Ven: 34 mm[Hg] — ABNORMAL LOW (ref 44–60)
pH, Ven: 7.4 (ref 7.25–7.43)
pO2, Ven: 59 mm[Hg] — ABNORMAL HIGH (ref 32–45)

## 2023-06-05 LAB — TSH: TSH: 2.805 u[IU]/mL (ref 0.350–4.500)

## 2023-06-05 LAB — HEMOGLOBIN A1C
Hgb A1c MFr Bld: 6.8 % — ABNORMAL HIGH (ref 4.8–5.6)
Mean Plasma Glucose: 148.46 mg/dL

## 2023-06-05 LAB — MAGNESIUM: Magnesium: 2.9 mg/dL — ABNORMAL HIGH (ref 1.7–2.4)

## 2023-06-05 LAB — GLUCOSE, CAPILLARY
Glucose-Capillary: 109 mg/dL — ABNORMAL HIGH (ref 70–99)
Glucose-Capillary: 184 mg/dL — ABNORMAL HIGH (ref 70–99)
Glucose-Capillary: 160 mg/dL — ABNORMAL HIGH (ref 70–99)
Glucose-Capillary: 196 mg/dL — ABNORMAL HIGH (ref 70–99)

## 2023-06-05 LAB — PROCALCITONIN: Procalcitonin: 4.04 ng/mL

## 2023-06-05 LAB — D-DIMER, QUANTITATIVE: D-Dimer, Quant: 13.52 ug{FEU}/mL — ABNORMAL HIGH (ref 0.00–0.50)

## 2023-06-05 LAB — CORTISOL-AM, BLOOD: Cortisol - AM: 19.2 ug/dL (ref 6.7–22.6)

## 2023-06-05 LAB — LACTIC ACID, PLASMA
Lactic Acid, Venous: 3.2 mmol/L (ref 0.5–1.9)
Lactic Acid, Venous: 4.1 mmol/L (ref 0.5–1.9)

## 2023-06-05 LAB — PROTIME-INR
INR: 1.5 — ABNORMAL HIGH (ref 0.8–1.2)
Prothrombin Time: 18.7 s — ABNORMAL HIGH (ref 11.4–15.2)

## 2023-06-05 MED ORDER — SENNOSIDES-DOCUSATE SODIUM 8.6-50 MG PO TABS
1.0000 | ORAL_TABLET | Freq: Two times a day (BID) | ORAL | Status: DC
  Administered 2023-06-05 – 2023-06-09 (×9): 1 via ORAL
  Filled 2023-06-05 (×9): qty 1

## 2023-06-05 MED ORDER — INSULIN ASPART 100 UNIT/ML IJ SOLN
0.0000 [IU] | Freq: Three times a day (TID) | INTRAMUSCULAR | Status: DC
  Administered 2023-06-05 – 2023-06-06 (×3): 3 [IU] via SUBCUTANEOUS
  Administered 2023-06-06 – 2023-06-07 (×2): 2 [IU] via SUBCUTANEOUS
  Administered 2023-06-08: 5 [IU] via SUBCUTANEOUS
  Administered 2023-06-08 – 2023-06-09 (×3): 3 [IU] via SUBCUTANEOUS
  Filled 2023-06-05 (×12): qty 1

## 2023-06-05 MED ORDER — METOPROLOL TARTRATE 5 MG/5ML IV SOLN
5.0000 mg | INTRAVENOUS | Status: DC | PRN
Start: 2023-06-05 — End: 2023-06-09
  Administered 2023-06-05 – 2023-06-06 (×4): 5 mg via INTRAVENOUS
  Filled 2023-06-05 (×4): qty 5

## 2023-06-05 MED ORDER — HYDROCODONE-ACETAMINOPHEN 5-325 MG PO TABS
1.0000 | ORAL_TABLET | ORAL | Status: DC | PRN
  Administered 2023-06-05: 2 via ORAL
  Administered 2023-06-05 – 2023-06-06 (×2): 1 via ORAL
  Administered 2023-06-06: 2 via ORAL
  Administered 2023-06-09: 1 via ORAL
  Filled 2023-06-05: qty 1
  Filled 2023-06-05: qty 2
  Filled 2023-06-05 (×2): qty 1
  Filled 2023-06-05: qty 2

## 2023-06-05 MED ORDER — VANCOMYCIN HCL 1250 MG/250ML IV SOLN
1250.0000 mg | INTRAVENOUS | Status: DC
  Administered 2023-06-05: 1250 mg via INTRAVENOUS
  Filled 2023-06-05: qty 250

## 2023-06-05 MED ORDER — INSULIN ASPART 100 UNIT/ML IJ SOLN: 0.0000 [IU] | Freq: Three times a day (TID) | INTRAMUSCULAR | Status: DC

## 2023-06-05 MED ORDER — METOPROLOL TARTRATE 25 MG PO TABS
12.5000 mg | ORAL_TABLET | Freq: Two times a day (BID) | ORAL | Status: DC
  Administered 2023-06-05 – 2023-06-06 (×3): 12.5 mg via ORAL
  Filled 2023-06-05 (×4): qty 1

## 2023-06-05 NOTE — ED Notes (Signed)
Dr. Para March notified of pt's rpt K+ 5.2. no further orders

## 2023-06-05 NOTE — Assessment & Plan Note (Signed)
Multifactorial -- treating for presumed sepsis pending infectious evaluation as outlined.   Ddx also includes malignancy and liver disease, metformin use Lactate trend 6.2 >> 5.5 >> 3.2 >> 4.1 >> 4.1 (despite aggressive IV hydration) --Continue to trend lactate. --Continue IV fluids --Hold metformin --Follow cultures & continue antibiotics for now

## 2023-06-05 NOTE — Progress Notes (Signed)
Progress Note   Patient: Jordan [name] WUJ:[id number] DOB: 1946/06/15 DOA: 06/26/2023     2 DOS: the patient was seen and examined on 06/06/2023   Brief hospital course: HPI on admission 06/14/2023:  " Jordan [name] is a 77 y.o. 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  Patient had an E. coli UTI diagnosed on 10/11 patient was seen in the ED on 10/28 for RUQ pain and workup with CT was about the same as prior findings.  He did received an NS bolus for hyponatremia of 128 and AKI of 1.89.  WBC 17,000 but he did not meet sepsis criteria.  He was started on Keflex for UTI (had admission for urosepsis March 2024) but he has only taken 1 dose due to poor oral intake and generally feeling unwell.  He has vague abdominal pain Mostly in the periumbilical and lower abdomen going from left to right.  Denies vomiting, diarrhea, constipation or dysuria.  His main complaint is generalized malaise. ..." See H&P for full HPI on admission and ED course.  Patient was admitted and started on empiric broad spectrum antibiotics.  RUQ U/S showed multiple liver mets, small volume ascites, gallbladder sludge, CBD diameter 5.2 mm, no sonographic Murphy sign, gallgbladder wall thickening (aslo seen previously).  MRCP negative on 10/31 obtained per Oncologist. Negative for choledocholithiasis.  Showed extensive liver metastatic disease, lymphadenopathy likely nodal mets, enhancement of vertebral bodies suspicious for osseous mets.  Cholelithiasis.  11/1 -- pt persistently tachycardic.  Lactic acid improving 6.2 >> 5.5 >> 3.2   Assessment and Plan: * Severe sepsis (HCC) Lactic acid, 5.2--> 5.8 UTI versus intra-abdominal source related to biliary disease History of E. coli UTI  05/14/2023 Remains on Sepsis fluids for persistent lactic acidosis and tachycardia Continue current antibiotics and de-escalate as appropriate Follow blood cultures Pain control PRN Trend lactic acid Monitor fever curve, CBC, hemodynamics  Case discussed with on-call surgeon who reviewed RUQ [place] and MRCP.  Recommend HIDA scan if not improving on antibiotics, for consideration of perc biliary drain with IR if needed, but gallbladder contracted on U/S and no signs of cholecystitic on MRCP.  Felt gallbladder unlikely source of sepsis.  Sinus tachycardia -- check D-dimer (expect in malignancy will be elevated).  Likely need to rule out PE given persistent tachycardia, high risk in setting of malignancy, not on anticoagulation. --Metoprolol PO BID, IV PRN --Telemetry --Antibiotics and fluids as above  Lactic acidosis Improving: lactate 6.2 >> 5.5 >> 3.2 Continue to trend lactate. 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 Goal of any treatment would be palliative"  -Patient is scheduled for PET scan on 11/4 to identify primary --Consulted Palliative Care, hopefully can see while admitted and establish goals of care  Acute renal failure superimposed on stage 3a chronic kidney disease (HCC) Anion gap metabolic acidosis Hyperkalemia Creatinine 1.97 on admission (from 1.3 few weeks  prior) Likely prerenal in related to poor oral intake and sepsis --Continue sepsis fluids  --Monitor BMP --Avoid nephrotoxin & hypotension --Renally dose meds and monitor --K  improved to 5.0, given Lokelma on admission  Essential hypertension Hypotension Patient hypotensive likely related to sepsis vs SIRS  Hold ramipril, hydrochlorothiazide, carvedilol and amlodipine 11/1 -- started low dose metop for tachycardia with less BP lowering effect vs coreg  Type 2 diabetes mellitus (HCC) Sliding scale insulin coverage Patient is currently on Ozempic, metformin and glipizide which will be on hold  CAD (coronary artery disease) --Continue atorvastatin --Holding carvedilol, ramipril due to soft BP's and sepsis  Increased anion gap metabolic acidosis Monitor BMP Due to sepsis, AKI  Hyperkalemia Resolved. K 5.0 Monitor BMP's  Hypotension Continue IV fluids per sepsis, extend duration. Maintain MAP > 65 Holding antihypertensives  Elevated LFTs Likely from metastatic disease  Cancer related pain Continue home tramadol  Hyperbilirubinemia .        Subjective: Pt awake resting in bed, feeling poorly.  He complaints of mid-lower abdominal pain, no nausea/vomiting.  Denies having chills or sweats but forehead appears diaphoretic.  He does not feel much like eating, but is thirsty.     Physical Exam: Vitals:   06/05/23 2357 06/06/23 0139 06/06/23 0333 06/06/23 0824  BP: 100/87 96/72 100/84 98/67  Pulse: (!) 139 (!) 143 86 (!) 138  Resp: 20  (!) 22 17  Temp: 97.8 F (36.6 C)  (!) 97.5 F (36.4 C) 97.8 F (36.6 C)  TempSrc: Oral  Oral   SpO2: 94%  96% 96%  Weight:      Height:       General exam: awake, alert, no acute distress HEENT: diaphoretic forehead, moist mucus membranes, hearing grossly normal  Respiratory system: CTAB, no wheezes, rales or rhonchi, normal respiratory effort. Cardiovascular system: normal S1/S2, RRR, no JVD, murmurs, rubs, gallops, no pedal edema.   Gastrointestinal system: mildly distended, mild generalized tenderness without rebound or guarding, +BS Central nervous system: A&O x 3. no gross focal neurologic  deficits, normal speech Extremities: moves all , no edema, normal tone Skin: dry, intact, warm, no rashes seen on visualized skin Psychiatry: normal mood, congruent affect, judgement and insight appear normal  Data Reviewed:  Notable labs ---  Na 131 from 130 Cl 97 Bicarb 20 Glucose 111 BUN 54 >> 54 Cr 2.00 >> 1.87 Ca 8.0 Lactic acid improving 3.2 D--dimer 13.52 INR 1.5  Family Communication: None present. Will attempt to call as time allows this afternoon.   Disposition: Status is: Inpatient Remains inpatient appropriate because: severity of illness as outlined   Planned Discharge Destination: Home    Time spent: 55 minutes including time at bedside and in coordination of care  Author: Pennie Banter, DO 06/06/2023 9:02 AM  For on call review [email or web address].

## 2023-06-05 NOTE — Assessment & Plan Note (Signed)
Monitor BMP Due to sepsis, AKI

## 2023-06-05 NOTE — Progress Notes (Addendum)
Notify MD if MAP dropping & staying below 65      06/05/23 1726  Assess: MEWS Score  Temp 97.8 F (36.6 C)  BP 96/69  MAP (mmHg) 78  Pulse Rate (!) 144  Resp 16  SpO2 96 %  O2 Device Room Air  Assess: MEWS Score  MEWS Temp 0  MEWS Systolic 1  MEWS Pulse 3  MEWS RR 0  MEWS LOC 0  MEWS Score 4  MEWS Score Color Red  Assess: if the MEWS score is Yellow or Red  Were vital signs accurate and taken at a resting state? Yes  Does the patient meet 2 or more of the SIRS criteria? Yes  Does the patient have a confirmed or suspected source of infection? Yes  MEWS guidelines implemented  Yes, red  Treat  MEWS Interventions Considered administering scheduled or prn medications/treatments as ordered  Take Vital Signs  Increase Vital Sign Frequency  Red: Q1hr x2, continue Q4hrs until patient remains green for 12hrs  Escalate  MEWS: Escalate Red: Discuss with charge nurse and notify provider. Consider notifying RRT. If remains red for 2 hours consider need for higher level of care  Notify: Charge Nurse/RN  Name of Charge Nurse/RN Notified juan,RN  Provider Notification  Provider Name/Title Dr. Denton Lank MD  Date Provider Notified 06/05/23  Time Provider Notified 1740  Assess: SIRS CRITERIA  SIRS Temperature  0  SIRS Pulse 1  SIRS Respirations  0  SIRS WBC 0  SIRS Score Sum  1

## 2023-06-05 NOTE — Assessment & Plan Note (Signed)
Likely from metastatic disease

## 2023-06-05 NOTE — Assessment & Plan Note (Signed)
Continue IV fluids per sepsis, extend duration. Maintain MAP > 65 Holding antihypertensives

## 2023-06-05 NOTE — Assessment & Plan Note (Signed)
Resolved. K 5.0 Monitor BMP's

## 2023-06-06 ENCOUNTER — Inpatient Hospital Stay: Payer: Medicare HMO

## 2023-06-06 DIAGNOSIS — R338 Other retention of urine: Secondary | ICD-10-CM | POA: Diagnosis not present

## 2023-06-06 DIAGNOSIS — E872 Acidosis, unspecified: Secondary | ICD-10-CM | POA: Diagnosis not present

## 2023-06-06 DIAGNOSIS — A419 Sepsis, unspecified organism: Secondary | ICD-10-CM | POA: Diagnosis not present

## 2023-06-06 DIAGNOSIS — I499 Cardiac arrhythmia, unspecified: Secondary | ICD-10-CM | POA: Diagnosis not present

## 2023-06-06 DIAGNOSIS — Z515 Encounter for palliative care: Secondary | ICD-10-CM

## 2023-06-06 DIAGNOSIS — R6520 Severe sepsis without septic shock: Secondary | ICD-10-CM | POA: Diagnosis not present

## 2023-06-06 DIAGNOSIS — R9431 Abnormal electrocardiogram [ECG] [EKG]: Secondary | ICD-10-CM | POA: Diagnosis present

## 2023-06-06 LAB — COMPREHENSIVE METABOLIC PANEL
ALT: 171 U/L — ABNORMAL HIGH (ref 0–44)
AST: 420 U/L — ABNORMAL HIGH (ref 15–41)
Albumin: 2 g/dL — ABNORMAL LOW (ref 3.5–5.0)
Alkaline Phosphatase: 846 U/L — ABNORMAL HIGH (ref 38–126)
Anion gap: 15 (ref 5–15)
BUN: 58 mg/dL — ABNORMAL HIGH (ref 8–23)
CO2: 20 mmol/L — ABNORMAL LOW (ref 22–32)
Calcium: 8.2 mg/dL — ABNORMAL LOW (ref 8.9–10.3)
Chloride: 99 mmol/L (ref 98–111)
Creatinine, Ser: 1.91 mg/dL — ABNORMAL HIGH (ref 0.61–1.24)
GFR, Estimated: 36 mL/min — ABNORMAL LOW (ref >60)
Glucose, Bld: 141 mg/dL — ABNORMAL HIGH (ref 70–99)
Potassium: 5.4 mmol/L — ABNORMAL HIGH (ref 3.5–5.1)
Sodium: 134 mmol/L — ABNORMAL LOW (ref 135–145)
Total Bilirubin: 7 mg/dL — ABNORMAL HIGH (ref 0.3–1.2)
Total Protein: 5.4 g/dL — ABNORMAL LOW (ref 6.5–8.1)

## 2023-06-06 LAB — LACTIC ACID, PLASMA
Lactic Acid, Venous: 4.1 mmol/L (ref 0.5–1.9)
Lactic Acid, Venous: 3.4 mmol/L (ref 0.5–1.9)

## 2023-06-06 LAB — PROTIME-INR
INR: 1.7 — ABNORMAL HIGH (ref 0.8–1.2)
Prothrombin Time: 19.9 s — ABNORMAL HIGH (ref 11.4–15.2)

## 2023-06-06 LAB — GLUCOSE, CAPILLARY
Glucose-Capillary: 137 mg/dL — ABNORMAL HIGH (ref 70–99)
Glucose-Capillary: 120 mg/dL — ABNORMAL HIGH (ref 70–99)
Glucose-Capillary: 176 mg/dL — ABNORMAL HIGH (ref 70–99)
Glucose-Capillary: 161 mg/dL — ABNORMAL HIGH (ref 70–99)
Glucose-Capillary: 165 mg/dL — ABNORMAL HIGH (ref 70–99)
Glucose-Capillary: 101 mg/dL — ABNORMAL HIGH (ref 70–99)
Glucose-Capillary: 138 mg/dL — ABNORMAL HIGH (ref 70–99)

## 2023-06-06 LAB — APTT: aPTT: 37 s — ABNORMAL HIGH (ref 24–36)

## 2023-06-06 LAB — CBC
HCT: 40.3 % (ref 39.0–52.0)
Hemoglobin: 14.1 g/dL (ref 13.0–17.0)
MCH: 27.4 pg (ref 26.0–34.0)
MCHC: 35 g/dL (ref 30.0–36.0)
MCV: 78.3 fL — ABNORMAL LOW (ref 80.0–100.0)
Platelets: 313 10*3/uL (ref 150–400)
RBC: 5.15 MIL/uL (ref 4.22–5.81)
RDW: 20.6 % — ABNORMAL HIGH (ref 11.5–15.5)
WBC: 17.7 10*3/uL — ABNORMAL HIGH (ref 4.0–10.5)
nRBC: 0 % (ref 0.0–0.2)

## 2023-06-06 LAB — URINE CULTURE: Culture: NO GROWTH

## 2023-06-06 LAB — LACTATE DEHYDROGENASE: LDH: 1987 U/L — ABNORMAL HIGH (ref 98–192)

## 2023-06-06 LAB — PROCALCITONIN: Procalcitonin: 4.89 ng/mL

## 2023-06-06 LAB — TROPONIN I (HIGH SENSITIVITY): Troponin I (High Sensitivity): 40 ng/L — ABNORMAL HIGH (ref <18)

## 2023-06-06 LAB — BRAIN NATRIURETIC PEPTIDE: B Natriuretic Peptide: 235.1 pg/mL — ABNORMAL HIGH (ref 0.0–100.0)

## 2023-06-06 MED ORDER — BISACODYL 5 MG PO TBEC: 5.0000 mg | DELAYED_RELEASE_TABLET | Freq: Every day | ORAL | Status: DC | PRN

## 2023-06-06 MED ORDER — HEPARIN (PORCINE) 25000 UT/250ML-% IV SOLN
1350.0000 [IU]/h | INTRAVENOUS | Status: DC
  Administered 2023-06-06 – 2023-06-08 (×3): 1350 [IU]/h via INTRAVENOUS
  Filled 2023-06-06 (×3): qty 250

## 2023-06-06 MED ORDER — METOPROLOL TARTRATE 25 MG PO TABS
25.0000 mg | ORAL_TABLET | Freq: Three times a day (TID) | ORAL | Status: DC
  Administered 2023-06-06 – 2023-06-07 (×3): 25 mg via ORAL
  Filled 2023-06-06 (×3): qty 1

## 2023-06-06 MED ORDER — MAGNESIUM HYDROXIDE 400 MG/5ML PO SUSP: 15.0000 mL | Freq: Every day | ORAL | Status: DC | PRN

## 2023-06-06 MED ORDER — MIDODRINE HCL 5 MG PO TABS
5.0000 mg | ORAL_TABLET | Freq: Two times a day (BID) | ORAL | Status: DC
  Administered 2023-06-06 – 2023-06-09 (×4): 5 mg via ORAL
  Filled 2023-06-06 (×5): qty 1

## 2023-06-06 MED ORDER — HEPARIN BOLUS VIA INFUSION
4000.0000 [IU] | Freq: Once | INTRAVENOUS | Status: AC
  Administered 2023-06-06: 4000 [IU] via INTRAVENOUS
  Filled 2023-06-06: qty 4000

## 2023-06-06 MED ORDER — POLYETHYLENE GLYCOL 3350 17 G PO PACK
17.0000 g | PACK | Freq: Every day | ORAL | Status: DC
  Administered 2023-06-06 – 2023-06-09 (×3): 17 g via ORAL
  Filled 2023-06-06 (×3): qty 1

## 2023-06-06 MED ORDER — SODIUM ZIRCONIUM CYCLOSILICATE 10 G PO PACK
10.0000 g | PACK | Freq: Once | ORAL | Status: AC
  Administered 2023-06-06: 10 g via ORAL
  Filled 2023-06-06: qty 1

## 2023-06-06 MED ORDER — SODIUM CHLORIDE 0.9 % IV SOLN: INTRAVENOUS | Status: DC

## 2023-06-06 NOTE — Progress Notes (Signed)
Patient complain of difficulty urinating, bladder scan done 492 ml MD made aware ordered in & out cath. With urine drained of 400 ml thru in & cath cath.

## 2023-06-06 NOTE — Assessment & Plan Note (Addendum)
A-flutter with RVR vs atrial ectopy Persistent tachycardia TSH normal --[facility] cardiology following --On IV heparin --HR not responding to metoprolol --Cardiology started labetalol drip --Transfer to stepdown --Echo normal EF --V/Q scan pending for tachycardia and elevated d-dimer - high risk for DVT/PE with malignancy --Telemetry

## 2023-06-06 NOTE — Progress Notes (Addendum)
Progress Note   Patient: Jordan [name]:[id number] DOB: 01-24-1946 DOA: 06/19/2023     2 DOS: the patient was seen and examined on 06/06/2023   Brief hospital course: HPI on admission 06/19/2023:  " Jordan [name] is a 77 y.o. 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  Patient had an E. coli UTI diagnosed on 10/11 patient was seen in the ED on 10/28 for RUQ pain and workup with CT was about the same as prior findings.  He did received an NS bolus for hyponatremia of 128 and AKI of 1.89.  WBC 17,000 but he did not meet sepsis criteria.  He was started on Keflex for UTI (had admission for urosepsis March 2024) but he has only taken 1 dose due to poor oral intake and generally feeling unwell.  He has vague abdominal pain Mostly in the periumbilical and lower abdomen going from left to right.  Denies vomiting, diarrhea, constipation or dysuria.  His main complaint is generalized malaise. ..." See H&P for full HPI on admission and ED course.  Patient was admitted and started on empiric broad spectrum antibiotics.  RUQ U/S showed multiple liver mets, small volume ascites, gallbladder sludge, CBD diameter 5.2 mm, no sonographic Murphy sign, gallgbladder wall thickening (aslo seen previously).  MRCP negative on 10/31 obtained per Oncologist. Negative for choledocholithiasis.  Showed extensive liver metastatic disease, lymphadenopathy likely nodal mets, enhancement of vertebral bodies suspicious for osseous mets.  Cholelithiasis.   11/1 -- pt persistently tachycardic.  Lactic acid improving 6.2 >> 5.5 >> 3.2 11/2 -- ongoing tachycardia HR 130's to 140's, Lactic acid remains in 3's >> 4.1 11/3 - persistent tachycardic and lactic acidosis, HR's do not improve with  metoprolol, soft BP's  cardiology consulted.  Pending echo, HIDA tomorrow, VQ scan etc as below.   Assessment and Plan: * Severe sepsis (HCC) Lactic acid, 5.2--> 5.8 UTI versus intra-abdominal source related to biliary disease History of E. coli UTI 05/14/2023 Remains on Sepsis fluids for persistent lactic acidosis and tachycardia UTI ruled out - urine culture no growth --Continue Cefepime, Flagyl --D/C Vancomycin --Follow blood cultures --Pain control PRN --Trend lactic acid --Monitor fever curve, CBC, hemodynamics  Case discussed with on-call surgeon who reviewed RUQ [place] and MRCP.  Recommend HIDA scan if not improving on antibiotics, for consideration of perc biliary drain with IR if needed, but gallbladder contracted on U/S and no signs of cholecystitic on MRCP.  Felt gallbladder unlikely source of sepsis. --HIDA scan ordered for tomorrow --NPO after midnight  Sinus tachycardia -- check D-dimer (expect in malignancy will be elevated).  Likely need to rule out PE given persistent tachycardia, high risk in setting of malignancy, not on anticoagulation. --Cardiology consulted --Metoprolol for HR control as BP allows --Telemetry --Antibiotics and fluids as above --Continue IV fluids  Lactic acidosis Multifactorial -- treating for presumed sepsis pending infectious evaluation as outlined.   Ddx also includes malignancy and liver disease, metformin use Lactate trend 6.2 >> 5.5 >> 3.2 >> 4.1 >> 4.1 (despite aggressive IV hydration) --Continue to trend lactate. 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 Goal of any treatment would be palliative"  -Patient is scheduled for PET scan on 11/4 to identify primary --Consulted Palliative Care, hopefully can see while admitted and establish goals of care  Acute renal failure superimposed on stage 3a chronic kidney disease (HCC) Anion gap metabolic acidosis Hyperkalemia - recurrent K 5.4 Creatinine 1.97 on admission (from 1.3 few weeks prior) Likely prerenal in related to poor oral intake and sepsis --Remains on IV fluids for AKI and lactic acidosis, tachycardia --Monitor BMP --Avoid nephrotoxin & hypotension --Renally dose meds and monitor --Lokelma 10 g x 1 today (11/3)  Essential hypertension Hypotension Patient hypotensive likely related to sepsis vs SIRS  Hold ramipril, hydrochlorothiazide, carvedilol and amlodipine 11/1 -- started low dose metop for tachycardia with less BP lowering effect vs coreg  Abnormal EKG A-flutter vs atrial ectopy Persistent tachycardia TSH normal --Consult [facility] cardiology - Dr. Juliann Pares  --Start IV heparin for now --[place] increased metoprolol to 25 mg TID, added midodrine.  Informed no HR improvement with metop doses given past two days --Echo is pending --Check hs-troponin, BNP --V/Q scan pending for tachycardia and elevated d-dimer - high risk for DVT/PE with malignancy  Acute urinary retention Pt required in/out cath this AM for bladder scan showing 492 cc and pt unable to void. --Bladder scans --Place Foley if persistent  Type 2 diabetes mellitus (HCC) Sliding scale insulin coverage Patient is currently on Ozempic, metformin and glipizide which will be on hold  CAD (coronary artery disease) Stable, no chest pain --Hold atorvastatin with rising LFT's --Holding carvedilol, ramipril due to soft BP's and sepsis  Increased anion gap metabolic acidosis Monitor  BMP Due to sepsis, AKI  Hyperkalemia Recurrent. K 5.4 Lokelma 10 g x 1 today Monitor BMP's  Hypotension Continue IV fluids per sepsis, extend duration. Maintain MAP > 65 Holding antihypertensives  Elevated LFTs Likely from metastatic disease  Cancer related pain Continue home tramadol  Hyperbilirubinemia .  Transaminitis .        Subjective: Pt awake resting in bed this AM.  He continues to feel terribly, no better.  Gets extremely tired and short of breath with any exertion.  Denies chest pain. No SOB at rest.  Had urinary retention and needed in/out cath this AM.     Physical Exam: Vitals:   06/06/23 0139 06/06/23 0333 06/06/23 0824 06/06/23 1210  BP: 96/72 100/84 98/67 103/63  Pulse: (!) 143 86 (!) 138 (!) 142  Resp:  (!) 22 17 16   Temp:  (!) 97.5 F (36.4 C) 97.8 F (36.6 C) 97.6 F (36.4 C)  TempSrc:  Oral    SpO2:  96% 96% 96%  Weight:      Height:       General exam: awake, alert, no acute distress, ill-appearing HEENT: moist mucus membranes, hearing grossly normal  Respiratory system: CTAB, no wheezes, rales or rhonchi, normal respiratory effort. Cardiovascular system: normal S1/S2, tachycardia, regular rhythm, no pedal edema.   Gastrointestinal system: mildly distended, less tender on palpation today, +BS Central nervous system: A&O x 3. no gross focal neurologic deficits, normal speech Extremities: moves all , no edema, normal tone Skin: dry, intact, warm, no rashes seen on visualized skin Psychiatry: normal mood, congruent affect, judgement  and insight appear normal  Data Reviewed:  Notable labs ---  Na 130 >> 131 >> 134 K 4.9 >> 5.4 Bicarb 21 >> 20 Glucose 141 BUN 54 >> 54 >> 58 Cr 2.00 >> 1.87 >> 1.91 Ca 8.2 Lactic acid ...3.2 >> 4.1 >> 3.4  BNP 235.1 LDH 1987 Troponin 40  11/2 D--dimer 13.52  11/3 AM Chest xray -- mild right basilar opacity felt to be atelectasis, otherwise no acute findings  Pending: HIDA scan VQ / pulm  perfusion scan Echocardiogram   Family Communication: Wife at bedside on rounds this AM.   Disposition: Status is: Inpatient Remains inpatient appropriate because: severity of illness as outlined   Planned Discharge Destination: Home    Time spent: 60 minutes including time at bedside and in coordination of care  Author: Pennie Banter, DO 06/06/2023 3:48 PM  For on call review [email or web address].

## 2023-06-06 NOTE — Consult Note (Signed)
CARDIOLOGY CONSULT NOTE               Patient ID: Jordan [name] MRN: [id number] DOB/AGE: 09-03-1945 77 y.o.  Admit date: 07/01/2023 Referring Physician Dr. Lindajo Royal hospitalist Primary Physician Dr. Maudie Flakes primary Primary Cardiologist Dr. Darrold Junker Reason for Consultation tachycardia atrial flutter  HPI: 77 year old male presents with metastatic pancreatic CA hepatitis diabetes moderate coronary disease urinary tract infection renal insufficiency stage III obesity patient was to have CT possible port placed for chemotherapy was found to have urinary tract infection as well as tachycardia rate rhythm suggestive of atrial flutter so was admitted elevated white count heart rate of 130s to 140s cardiology was consulted for further evaluation and management  Review of systems complete and found to be negative unless listed above     Past Medical History:  Diagnosis Date   Anemia    Arthritis    Coronary artery disease    Diabetes mellitus without complication (HCC)    Diverticulosis    Duodenal ulcer    Erectile dysfunction    GERD (gastroesophageal reflux disease)    Hyperlipidemia    Hypertension     Past Surgical History:  Procedure Laterality Date   CARDIAC CATHETERIZATION     COLONOSCOPY WITH PROPOFOL N/A 05/31/2017   Procedure: COLONOSCOPY WITH PROPOFOL;  Surgeon: Scot Jun, MD;  Location: [facility] ENDOSCOPY;  Service: Endoscopy;  Laterality: N/A;   JOINT REPLACEMENT Left    knee   THORACIC DISCECTOMY N/A 08/09/2019   Procedure: T11-12 POSTERIOR FUSION WITH TRANSPEDICULAR DECOMPRESSION;  Surgeon: Venetia Night, MD;  Location: [facility] ORS;  Service: Neurosurgery;  Laterality: N/A;   VASECTOMY      Medications Prior to Admission  Medication Sig Dispense Refill Last Dose   amLODipine (NORVASC) 10 MG tablet Take 10 mg by mouth daily.   Unknown at Unknown   ascorbic acid (VITAMIN C) 1000 MG tablet Take 1,000 mg by mouth daily.   Unknown at Unknown    atorvastatin (LIPITOR) 20 MG tablet Take 20 mg by mouth daily.   Unknown at Unknown   cephALEXin (KEFLEX) 500 MG capsule Take 1 capsule (500 mg total) by mouth 3 (three) times daily for 7 days. 21 capsule 0 Unknown at Unknown   cholecalciferol (VITAMIN D) 25 MCG (1000 UT) tablet Take 1,000 Units by mouth daily.   Unknown at Unknown   CINNAMON PO Take 1,000 mg by mouth daily.    Unknown at Unknown   glipiZIDE (GLUCOTROL) 5 MG tablet Take 5 mg by mouth daily.   Unknown at Unknown   hydrochlorothiazide (HYDRODIURIL) 25 MG tablet Take 25 mg by mouth daily.   Unknown at Unknown   magnesium oxide (MAG-OX) 400 MG tablet Take 400 mg by mouth daily.   Unknown at Unknown   meloxicam (MOBIC) 15 MG tablet Take 1 tablet (15 mg total) by mouth daily. Take with food. 30 tablet 1 Unknown at Unknown   metFORMIN (GLUCOPHAGE) 1000 MG tablet Take 1,000 mg by mouth 2 (two) times daily with a meal.   Unknown at Unknown   omega-3 acid ethyl esters (LOVAZA) 1 g capsule Take 1 capsule by mouth 2 (two) times daily.   Unknown at Unknown   ramipril (ALTACE) 10 MG capsule Take 10 mg by mouth 2 (two) times daily.    Unknown at Unknown   Semaglutide, 2 MG/DOSE, 8 MG/3ML SOPN Inject 0.75 mLs into the skin once a week. (Tuesday)   Unknown at Unknown   traMADol (ULTRAM) 50 MG  tablet Take 1 tablet (50 mg total) by mouth every 8 (eight) hours as needed. 30 tablet 0 prn at prn   carvedilol (COREG) 3.125 MG tablet Take 3.125 mg by mouth 2 (two) times daily. (Patient not taking: Reported on 05/27/2023)   Not Taking   vitamin B-12 (CYANOCOBALAMIN) 1000 MCG tablet Take 1,000 mcg by mouth daily.   Unknown at Unknown   Social History   Socioeconomic History   Marital status: Married    Spouse name: Not on file   Number of children: Not on file   Years of education: Not on file   Highest education level: Not on file  Occupational History   Not on file  Tobacco Use   Smoking status: Former    Current packs/day: 0.00    Types:  Cigarettes    Quit date: 11/02/1987    Years since quitting: 35.6   Smokeless tobacco: Never  Vaping Use   Vaping status: Never Used  Substance and Sexual Activity   Alcohol use: No   Drug use: No   Sexual activity: Not on file  Other Topics Concern   Not on file  Social History Narrative   Not on file   Social Determinants of Health   Financial Resource Strain: Not on file  Food Insecurity: No Food Insecurity (06/05/2023)   Hunger Vital Sign    Worried About Running Out of Food in the Last Year: Never true    Ran Out of Food in the Last Year: Never true  Transportation Needs: No Transportation Needs (06/05/2023)   PRAPARE - Administrator, Civil Service (Medical): No    Lack of Transportation (Non-Medical): No  Physical Activity: Unknown (01/11/2018)   Received from [facility]   Exercise Vital Sign    Days of Exercise per Week: 3 days    Minutes of Exercise per Session: Not on file  Stress: Not on file  Social Connections: Not on file  Intimate Partner Violence: Not At Risk (06/05/2023)   Humiliation, Afraid, Rape, and Kick questionnaire    Fear of Current or Ex-Partner: No    Emotionally Abused: No    Physically Abused: No    Sexually Abused: No    Family History  Problem Relation Age of Onset   Hypertension Mother       Review of systems complete and found to be negative unless listed above      PHYSICAL EXAM  General: Well developed, well nourished, in no acute distress HEENT:  Normocephalic and atramatic Neck:  No JVD.  Lungs: Clear bilaterally to auscultation and percussion. Heart: Tachycardia. Normal S1 and S2 without gallops or murmurs.  Abdomen: Bowel sounds are positive, abdomen soft and non-tender  Msk:  Back normal, normal gait. Normal strength and tone for age. Extremities: No clubbing, cyanosis or edema.   Neuro: Alert and oriented X 3. Psych:  Good affect, responds appropriately  Labs:   Lab Results  Component  Value Date   WBC 17.7 (H) 06/06/2023   HGB 14.1 06/06/2023   HCT 40.3 06/06/2023   MCV 78.3 (L) 06/06/2023   PLT 313 06/06/2023    Recent Labs  Lab 06/06/23 0547  NA 134*  K 5.4*  CL 99  CO2 20*  BUN 58*  CREATININE 1.91*  CALCIUM 8.2*  PROT 5.4*  BILITOT 7.0*  ALKPHOS 846*  ALT 171*  AST 420*  GLUCOSE 141*   No results found for: "CKTOTAL", "CKMB", "CKMBINDEX", "TROPONINI" No results found  for: "CHOL" No results found for: "HDL" No results found for: "LDLCALC" No results found for: "TRIG" No results found for: "CHOLHDL" No results found for: "LDLDIRECT"    Radiology: US Abdomen Limited RUQ (LIVER/GB)  Result Date: 06/15/2023 CLINICAL DATA:  Right upper quadrant pain EXAM: ULTRASOUND ABDOMEN LIMITED RIGHT UPPER QUADRANT COMPARISON:  Abdominal MRI dated June 03, 2023 FINDINGS: Gallbladder: Gallstones and sludge within contracted gallbladder. Gallbladder wall thickening, measuring up to 6 mm. No sonographic Murphy sign noted by sonographer. Common bile duct: Diameter: 5.2 mm Liver: Multiple hypoattenuating solid liver lesions. Reference lesion of the right hepatic lobe measuring 5.6 x 3.9 x 6.4 cm. Reference lesion of the left hepatic lobe measuring 3.1 x 3.0 x 3.6 cm. Nodular liver contour, likely due to liver lesions. Increased parenchymal echogenicity. Portal vein is patent on color Doppler imaging with normal direction of blood flow towards the liver. Other: Small volume abdominal ascites. IMPRESSION: 1. Multiple liver masses, consistent with metastatic disease. See previously performed cross-sectional imaging for further discussion. 2. Small volume abdominal ascites. 3. Gallstones and sludge within contracted gallbladder. 4. Gallbladder wall thickening, likely secondary to liver disease. Electronically Signed   By: Allegra Lai M.D.   On: 06/15/2023 21:15   DG Chest Port 1 View  Result Date: 06/17/2023 CLINICAL DATA:  Sepsis workup.  Hypotension and tachycardia. EXAM:  PORTABLE CHEST 1 VIEW COMPARISON:  CT 05/18/2023.  Radiographs 12/04/2021. FINDINGS: 1541 hours. The heart size and mediastinal contours are stable with aortic atherosclerosis. There is asymmetric elevation of the right hemidiaphragm with mild bibasilar atelectasis. No edema, confluent airspace disease, pleural effusion or pneumothorax. Postsurgical changes noted within the lower thoracic spine. No acute osseous findings are seen. IMPRESSION: Mild bibasilar atelectasis. No evidence of pneumonia or edema. Electronically Signed   By: Carey Bullocks M.D.   On: 06/17/2023 18:16   MR ABDOMEN MRCP W WO CONTAST  Result Date: 06/03/2023 CLINICAL DATA:  Elevated liver enzymes, hepatic metastatic disease, history of liver biopsy, worsening abdominal pain, follow-up recent CT EXAM: MRI ABDOMEN WITHOUT AND WITH CONTRAST (INCLUDING MRCP) TECHNIQUE: Multiplanar multisequence MR imaging of the abdomen was performed both before and after the administration of intravenous contrast. Heavily T2-weighted images of the biliary and pancreatic ducts were obtained, and three-dimensional MRCP images were rendered by post processing. CONTRAST:  10mL GADAVIST GADOBUTROL 1 MMOL/ML IV SOLN COMPARISON:  CT abdomen pelvis, 05/31/2023 FINDINGS: Lower chest: Trace right pleural effusion, unchanged. Hepatobiliary: Hepatomegaly, maximum coronal span 24.2 cm. The liver is grossly enlarged by numerous bulky rim enhancing, internally hypoenhancing liver metastases as seen on prior examinations, not significantly changed. Large index lesion of the peripheral right lobe of the liver, hepatic segments VI/VII measures 10.0 x 5.5 cm (series 27, image 32). Gallstones and sludge contracted in the gallbladder. No intra or extrahepatic biliary ductal dilatation. Pancreas: Unremarkable. No pancreatic ductal dilatation or surrounding inflammatory changes. Spleen: Normal in size without significant abnormality. Adrenals/Urinary Tract: Adrenal glands are  unremarkable. Multiple simple, benign and thinly septated renal cortical cysts, for which no specific further follow-up or characterization is required. Lobulated cortical scarring of the right kidney with prominence of the right renal pelvis and associated mucosal thickening, similar to prior examination (series 29, image 65). No obvious calculi or hydronephrosis. Stomach/Bowel: Stomach is within normal limits. No evidence of bowel wall thickening, distention, or inflammatory changes. Vascular/Lymphatic: Aortic atherosclerosis. Enlarged portacaval and porta hepatis lymph nodes measuring up to 4.0 x 1.6 cm (series 27, image 49). Prominent subcentimeter retroperitoneal lymph nodes (  series 29, image 59). Other: No abdominal wall hernia or abnormality. Small volume perihepatic and perisplenic ascites, similar to prior examination. Musculoskeletal: Diffusely heterogeneous enhancement of the included vertebral bodies. IMPRESSION: 1. The liver is grossly enlarged by numerous bulky rim enhancing, internally hypoenhancing liver metastases as seen on prior examinations, not significantly changed. 2. No biliary ductal dilatation. 3. Enlarged portacaval and porta hepatis lymph nodes measuring up to 4.0 x 1.6 cm. Prominent subcentimeter retroperitoneal lymph nodes. Findings are suspicious for nodal metastatic disease. 4. Diffusely heterogeneous enhancement of the included vertebral bodies, suspicious for osseous metastatic disease. 5. Small volume perihepatic and perisplenic ascites, similar to prior examination. Trace right pleural effusion, unchanged. 6. Lobulated cortical scarring of the right kidney with prominence of the right renal pelvis and associated mucosal thickening, similar to prior examination. No obvious calculi or hydronephrosis. 7. Cholelithiasis. Aortic Atherosclerosis (ICD10-I70.0). Electronically Signed   By: Jearld Lesch M.D.   On: 06/03/2023 10:02   MR 3D Recon At Scanner  Result Date:  06/03/2023 CLINICAL DATA:  Elevated liver enzymes, hepatic metastatic disease, history of liver biopsy, worsening abdominal pain, follow-up recent CT EXAM: MRI ABDOMEN WITHOUT AND WITH CONTRAST (INCLUDING MRCP) TECHNIQUE: Multiplanar multisequence MR imaging of the abdomen was performed both before and after the administration of intravenous contrast. Heavily T2-weighted images of the biliary and pancreatic ducts were obtained, and three-dimensional MRCP images were rendered by post processing. CONTRAST:  10mL GADAVIST GADOBUTROL 1 MMOL/ML IV SOLN COMPARISON:  CT abdomen pelvis, 05/31/2023 FINDINGS: Lower chest: Trace right pleural effusion, unchanged. Hepatobiliary: Hepatomegaly, maximum coronal span 24.2 cm. The liver is grossly enlarged by numerous bulky rim enhancing, internally hypoenhancing liver metastases as seen on prior examinations, not significantly changed. Large index lesion of the peripheral right lobe of the liver, hepatic segments VI/VII measures 10.0 x 5.5 cm (series 27, image 32). Gallstones and sludge contracted in the gallbladder. No intra or extrahepatic biliary ductal dilatation. Pancreas: Unremarkable. No pancreatic ductal dilatation or surrounding inflammatory changes. Spleen: Normal in size without significant abnormality. Adrenals/Urinary Tract: Adrenal glands are unremarkable. Multiple simple, benign and thinly septated renal cortical cysts, for which no specific further follow-up or characterization is required. Lobulated cortical scarring of the right kidney with prominence of the right renal pelvis and associated mucosal thickening, similar to prior examination (series 29, image 65). No obvious calculi or hydronephrosis. Stomach/Bowel: Stomach is within normal limits. No evidence of bowel wall thickening, distention, or inflammatory changes. Vascular/Lymphatic: Aortic atherosclerosis. Enlarged portacaval and porta hepatis lymph nodes measuring up to 4.0 x 1.6 cm (series 27, image 49).  Prominent subcentimeter retroperitoneal lymph nodes (series 29, image 59). Other: No abdominal wall hernia or abnormality. Small volume perihepatic and perisplenic ascites, similar to prior examination. Musculoskeletal: Diffusely heterogeneous enhancement of the included vertebral bodies. IMPRESSION: 1. The liver is grossly enlarged by numerous bulky rim enhancing, internally hypoenhancing liver metastases as seen on prior examinations, not significantly changed. 2. No biliary ductal dilatation. 3. Enlarged portacaval and porta hepatis lymph nodes measuring up to 4.0 x 1.6 cm. Prominent subcentimeter retroperitoneal lymph nodes. Findings are suspicious for nodal metastatic disease. 4. Diffusely heterogeneous enhancement of the included vertebral bodies, suspicious for osseous metastatic disease. 5. Small volume perihepatic and perisplenic ascites, similar to prior examination. Trace right pleural effusion, unchanged. 6. Lobulated cortical scarring of the right kidney with prominence of the right renal pelvis and associated mucosal thickening, similar to prior examination. No obvious calculi or hydronephrosis. 7. Cholelithiasis. Aortic Atherosclerosis (ICD10-I70.0). Electronically Signed  By: Jearld Lesch M.D.   On: 06/03/2023 10:02   CT ABDOMEN PELVIS WO CONTRAST  Result Date: 05/31/2023 CLINICAL DATA:  Abdominal pain EXAM: CT ABDOMEN AND PELVIS WITHOUT CONTRAST TECHNIQUE: Multidetector CT imaging of the abdomen and pelvis was performed following the standard protocol without IV contrast. RADIATION DOSE REDUCTION: This exam was performed according to the departmental dose-optimization program which includes automated exposure control, adjustment of the mA and/or kV according to patient size and/or use of iterative reconstruction technique. COMPARISON:  05/07/2023 FINDINGS: Lower chest: Aortic and coronary artery atherosclerosis. Trace right pleural effusion. Hepatobiliary: Diffuse hypoattenuating masses  throughout both lobes of the liver, increased in size compared to the recent prior CT. Liver measures approximately 24 cm in length. Sludge or small stones within the gallbladder. Gallbladder is partially contracted. Pancreas: No focal abnormality of the pancreas is identified on noncontrast imaging. No inflammatory changes. Spleen: Normal in size without focal abnormality. Adrenals/Urinary Tract: No adrenal nodules or masses. Urothelial thickening of the right renal pelvis is similar in appearance to prior. No stone or hydronephrosis. Urinary bladder within normal limits. Stomach/Bowel: Stomach is within normal limits. Colonic diverticulosis. No evidence of bowel wall thickening, distention, or inflammatory changes. Vascular/Lymphatic: Aortoiliac atherosclerosis without aneurysm. Several small retroperitoneal lymph nodes are similar to prior. No abdominopelvic lymphadenopathy by size criteria. Reproductive: Prostate is unremarkable. Other: Small volume ascites is new from prior. No pneumoperitoneum. Small fat containing umbilical hernia. Musculoskeletal: There are several small sclerotic lesions throughout the spine, bilateral ribs, and pelvis, similar to slightly increased compared to prior. Reference lesion is a small sclerotic lesion within the T8 vertebral body (series 5, image 90). IMPRESSION: 1. Worsening hepatic metastatic disease. 2. Small volume ascites is new from prior. 3. Several small sclerotic lesions throughout the spine, bilateral ribs, and pelvis, similar to slightly increased compared to prior. Findings are compatible with osseous metastatic disease. 4. Urothelial thickening of the right renal pelvis is similar in appearance to prior. Correlate with urinalysis. 5. Trace right pleural effusion. 6. Colonic diverticulosis without evidence of diverticulitis. No bowel obstruction. 7. Aortic and coronary artery atherosclerosis (ICD10-I70.0). Electronically Signed   By: Duanne Guess D.O.   On:  05/31/2023 18:30   CT CHEST W CONTRAST  Result Date: 05/27/2023 CLINICAL DATA:  Liver metastases on recent CT abdomen. * Tracking Code: BO * EXAM: CT CHEST WITH CONTRAST TECHNIQUE: Multidetector CT imaging of the chest was performed during intravenous contrast administration. RADIATION DOSE REDUCTION: This exam was performed according to the departmental dose-optimization program which includes automated exposure control, adjustment of the mA and/or kV according to patient size and/or use of iterative reconstruction technique. CONTRAST:  75mL OMNIPAQUE IOHEXOL 300 MG/ML  SOLN COMPARISON:  CT abdomen pelvis 05/07/2023 and CT chest 12/04/2021. FINDINGS: Cardiovascular: Atherosclerotic calcification of the aorta, aortic valve and coronary arteries. Ascending aorta measures up to 3.9 cm (4/71). Enlarged pulmonic trunk and heart. No pericardial effusion. Mediastinum/Nodes: No pathologically enlarged mediastinal, hilar or axillary lymph nodes. Esophagus is grossly unremarkable. Lungs/Pleura: Lungs are clear. No pleural fluid. Airway is unremarkable. Upper Abdomen: Mildly hypoattenuating masses throughout the liver with index mass in the right hepatic lobe measuring 6.5 x 8.3 cm (2/145). Visualized portions of the gallbladder, adrenal glands and right kidney are otherwise grossly unremarkable. There may be a left renal sinus cyst, partially imaged. No specific follow-up necessary. Visualized portions of the spleen, pancreas, stomach and bowel are grossly unremarkable. Scattered ascites. Periportal adenopathy measures up to 1.5 cm. Musculoskeletal: Degenerative changes in the  spine. T11-12 posterior fusion. Scattered sclerotic lesions in the visualized osseous structures, new from 12/04/2021. IMPRESSION: 1. Hepatic and osseous metastatic disease. No evidence of primary malignancy in the chest. 2. Scattered ascites. 3. Aortic atherosclerosis (ICD10-I70.0). Coronary artery calcification. 4. Enlarged pulmonic trunk,  indicative of pulmonary arterial hypertension. Electronically Signed   By: Leanna Battles M.D.   On: 05/27/2023 13:45   US BIOPSY (LIVER)  Result Date: 05/27/2023 INDICATION: Multiple liver lesions with unknown primary malignancy. EXAM: ULTRASOUND GUIDED CORE BIOPSY OF LIVER MASS MEDICATIONS: None. ANESTHESIA/SEDATION: Moderate (conscious) sedation was employed during this procedure. A total of Versed 1.0 mg and Fentanyl 50 mcg was administered intravenously. Moderate Sedation Time: 10 minutes. The patient's level of consciousness and vital signs were monitored continuously by radiology nursing throughout the procedure under my direct supervision. PROCEDURE: The procedure, risks, benefits, and alternatives were explained to the patient. Questions regarding the procedure were encouraged and answered. The patient understands and consents to the procedure. A time-out was performed prior to initiating the procedure. Ultrasound was performed to localize liver lesions. The abdominal wall was prepped with chlorhexidine in a sterile fashion, and a sterile drape was applied covering the operative field. A sterile gown and sterile gloves were used for the procedure. Local anesthesia was provided with 1% Lidocaine. A 17 gauge trocar needle was advanced into the liver at the level of a right lobe liver lesion under ultrasound guidance. After confirming needle tip position, coaxial 18 gauge core biopsy samples were obtained and submitted in formalin. Three total core biopsy samples were obtained. Gel-Foam pledgets were advanced through the outer needle prior to its removal. Additional ultrasound was performed. COMPLICATIONS: None immediate. FINDINGS: Ultrasound demonstrates multiple circumscribed and ill-defined lesions within the liver. A lesion in the inferior right lobe measuring up to approximately 4.8 cm in maximum diameter was chosen for sampling. Solid core biopsy samples were obtained. IMPRESSION: Ultrasound-guided  core biopsy performed of a mass within the right lobe of the liver measuring up to approximately 4.8 cm in maximum diameter. Electronically Signed   By: Irish Lack M.D.   On: 05/27/2023 13:27   CT ABDOMEN PELVIS W CONTRAST  Result Date: 05/07/2023 CLINICAL DATA:  Abdominal pain for several weeks. Diverticulosis. Duodenal ulcer. * Tracking Code: BO * EXAM: CT ABDOMEN AND PELVIS WITH CONTRAST TECHNIQUE: Multidetector CT imaging of the abdomen and pelvis was performed using the standard protocol following bolus administration of intravenous contrast. RADIATION DOSE REDUCTION: This exam was performed according to the departmental dose-optimization program which includes automated exposure control, adjustment of the mA and/or kV according to patient size and/or use of iterative reconstruction technique. CONTRAST:  OMNIPAQUE IOHEXOL 300 MG/ML  SOLN COMPARISON:  12/04/2021 FINDINGS: Lower Chest: No acute findings. Hepatobiliary: Numerous hypovascular masses are seen throughout the liver which are new since previous study, consistent with diffuse liver metastases. Insert Pancreas:  No mass or inflammatory changes. Spleen: Within normal limits in size and appearance. Adrenals/Urinary Tract: No suspicious masses identified. Increased diffuse right renal parenchymal atrophy is seen since previous study, with significantly decreased pelvicaliectasis or parapelvic cyst. No evidence of ureteral calculi or dilatation. Unremarkable unopacified urinary bladder. Stomach/Bowel: No evidence of obstruction, inflammatory process or abnormal fluid collections. Diverticulosis is seen mainly involving the descending and sigmoid colon, however there is no evidence of diverticulitis. Normal appendix visualized. Vascular/Lymphatic: Shotty sub-cm lymph nodes are seen throughout the abdominal retroperitoneum none of which are pathologically enlarged. No pathologically enlarged lymph nodes. No acute vascular findings. Reproductive:  No mass or other significant abnormality. Other:  Small umbilical hernia is seen, which contains only fat. Musculoskeletal:  No suspicious bone lesions identified. IMPRESSION: Diffuse liver metastases. No primary malignancy identified within the abdomen or pelvis. Colonic diverticulosis, without radiographic evidence of diverticulitis. Increased diffuse right renal parenchymal atrophy, with significantly decreased pelvicaliectasis or parapelvic cyst. Small umbilical hernia, which contains only fat. Electronically Signed   By: Danae Orleans M.D.   On: 05/07/2023 15:34    EKG: Atrial flutter rate of 140 narrow complex  ASSESSMENT AND PLAN:  Atrial fibrillation rapid ventricular response Diabetes Pancreatic CA metastatic Chronic renal sufficiency stage IIIa Hypertension Obesity Coronary artery disease GERD Hyperlipidemia . Plan Agree with admit to telemetry Follow-up EKGs troponins Agree with echocardiogram and telemetry Increased rate control with metoprolol therapy consider adding sotalol Will avoid amiodarone because of hepatic insufficiency Continue statin therapy for hyperlipidemia Maintain diabetes management and control Consider short-term anticoagulation with heparin or Lovenox because of atrial flutter Do not currently recommend electrical cardioversion  Signed: Alwyn Pea MD 06/06/2023, 1:03 PM

## 2023-06-06 NOTE — Consult Note (Signed)
PHARMACY - ANTICOAGULATION CONSULT NOTE  Pharmacy Consult for heparin infusion Indication: atrial fibrillation  Allergies  Allergen Reactions   Actos [Pioglitazone] Hives   Amoxicillin Hives and Rash    Patient Measurements: Height: 5\' 11"  (180.3 cm) Weight: 105.7 kg (233 lb) IBW/kg (Calculated) : 75.3 Heparin Dosing Weight: 97.6  Vital Signs: Temp: 97.6 F (36.4 C) (11/03 1210) BP: 103/63 (11/03 1210) Pulse Rate: 142 (11/03 1210)  Labs: Recent Labs    06/24/2023 1520 06/22/2023 2102 06/05/23 0049 06/05/23 0409 06/05/23 0842 06/05/23 2119 06/06/23 0547 06/06/23 1250  HGB 15.2 13.9  --   --   --   --  14.1  --   HCT 45.0 40.6  --   --   --   --  40.3  --   PLT 329 300  --   --   --   --  313  --   LABPROT 17.9*  --   --  18.7*  --   --   --   --   INR 1.5*  --   --  1.5*  --   --   --   --   CREATININE 1.89* 1.97*   < > 2.00* 1.87* 1.87* 1.91*  --   TROPONINIHS  --   --   --   --   --   --   --  40*   < > = values in this interval not displayed.    Estimated Creatinine Clearance: 40.7 mL/min (A) (by C-G formula based on SCr of 1.91 mg/dL (H)).   Medical History: Past Medical History:  Diagnosis Date   Anemia    Arthritis    Coronary artery disease    Diabetes mellitus without complication (HCC)    Diverticulosis    Duodenal ulcer    Erectile dysfunction    GERD (gastroesophageal reflux disease)    Hyperlipidemia    Hypertension     Medications:  No home anticoagulants per pharmacist review.  Patient was on enoxaparin 52.5 mg SubQ every 24 hours with last dose 11/2 @ 2133  Assessment: 77 yo male with PMH including DM, CAD, HTN, CKD, and metastatic cancer involving the liver.  Patient admitted for treatment of sepsis.   Patient also found to have abnormal EKG readings found to be Aflutter.  Pharmacy consulted to initiate heparin infusion.  Goal of Therapy:  Heparin level 0.3-0.7 units/ml Monitor platelets by anticoagulation protocol: Yes   Plan:  Give  4000 units bolus x 1 Start heparin infusion at 1350 units/hr Check anti-Xa level in 8 hours and daily while on heparin Continue to monitor H&H and platelets  Barrie Folk, PharmD 06/06/2023,3:43 PM

## 2023-06-06 NOTE — Consult Note (Signed)
Consultation Note Date: 06/06/2023   Patient Name: Jordan [name]  DOB: Jul 14, 1946  MRN: [id number]  Age / Sex: 77 y.o., male  PCP: Marina Goodell, MD Referring Physician: Pennie Banter, DO  Reason for Consultation: Establishing goals of care   HPI/Brief Hospital Course: 77 y.o. male  with past medical history of CAD, DM, CKD 3A, longstanding smoker and new diagnosis of metastatic disease with unknown primary source involving liver, bone and lymph nodes admitted on 06/28/2023 with concern for weakness when he arrived for his outpatient port placement and was recommended to be evaluated in the emergency department.  Underwent liver biopsy on 1024 revealing poorly differentiated adenocarcinoma with extensive necrosis Has been seen by oncology who discussed with Jordan [name] that unfortunately his treatment options would not have a goal of cure but would be palliative treatment  Has also recently been seen in ED for abdominal pain without acute findings, underwent MRCP on 10/31 (-) and has recently been treated for UTI with appropriate antibiotics  In ED met sepsis criteria due to tachycardia as well as lactic acidosis  Admitted and being treated for severe sepsis likely secondary to UTI versus intra-abdominal source related to biliary disease.  Echocardiogram, HIDA scan, and VQ scan pending.  Palliative medicine was consulted for assisting with goals of care conversations  Subjective:  Extensive chart review has been completed prior to meeting patient including labs, vital signs, imaging, progress notes, orders, and available advanced directive documents from current and previous encounters.  Visited with Jordan [name] at his bedside he is awake, alert and able to engage in conversation.  His wife Jordan [name] is at bedside during time of visit.  Introduced myself as a Publishing rights manager as a member of the palliative care team. Explained palliative medicine is  specialized medical care for people living with serious illness. It focuses on providing relief from the symptoms and stress of a serious illness. The goal is to improve quality of life for both the patient and the family.   Jordan [name] shares at last oncology visit they had referred Jordan [name] to palliative care and had an appointment scheduled for Wednesday of this week.  Discussed that Jordan Munroe, Jordan [name] is a palliative provider that specializes in oncology who will be involved closely in his case.  Shared that I will reach out to [facility] and request him visiting while Jordan [name] is inpatient.  Both Jordan [name] and Jordan [name] able to share their understanding of most recent results and most recent visits with multiple providers.  Jordan [name] shares there has been so much new information given to them that they are quite overwhelmed.  Jordan [name] shares she and Jordan [name] have been married for many years together they do not have any children but Jordan [name] has 3 sons from a prior marriage.  Jordan [name] also shares Jordan [name] has many members of his extended family and they also have church family that provide them with a great support system.  Jordan [name] shares his pain is currently well-controlled and denies any other acute symptoms.  At baseline Jordan [name] shares Jordan [name] has difficulty ambulating due to a bulging disc that has caused profound weakness to his lower extremities.  He uses a walker for assistance with ambulation.  We discussed advanced directives involving Jordan [name] establishing a HCPOA and completing a living will.  Jordan [name] shares if/when appropriate he wishes to appoint his wife Jordan [name] as they are get Environmental health practitioner.  He and Jordan [name] both expressed interest in  completing an advance directive-blue booklet provided to [facility] for her review.  I discussed importance of continued conversations with family/support persons and all members of their medical team regarding overall plan of care and  treatment options ensuring decisions are in alignment with patients goals of care.  All questions/concerns addressed. Emotional support provided to patient/family/support persons. PMT will continue to follow and support patient as needed.  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    Appearance: He is not ill-appearing.  Pulmonary:     Effort: Pulmonary effort is normal. No respiratory distress.  Abdominal:     General: There is distension.  Skin:    General: Skin is warm and dry.  Neurological:     Mental Status: He is alert and oriented to person, place, and time.     Motor: Weakness present.     Vital Signs: BP 99/73 (BP Location: Left Arm)   Pulse (!) 138   Temp (!) 97.5 F (36.4 C)   Resp 16   Ht 5\' 11"  (1.803 m)   Wt 105.7 kg   SpO2 90%   BMI 32.50 kg/m  Pain Scale: 0-10   Pain Score: 6   IO: Intake/output summary:  Intake/Output Summary (Last 24 hours) at 06/06/2023 1721 Last data filed at 06/06/2023 1400 Gross per 24 hour  Intake 3329.04 ml  Output 700 ml  Net 2629.04 ml    LBM: Last BM Date :  (PTA) Baseline Weight: Weight: 105.7 kg Most recent weight: Weight: 105.7 kg       Assessment and Plan  SUMMARY OF RECOMMENDATIONS   Ongoing GOC needed PMT to continue to follow for ongoing needs and support   Thank you for this consult and allowing Palliative Medicine to participate in the care of Chaysen Lachney. Palliative medicine will continue to follow and  assist as needed.   Time Total: 55 minutes  Time spent includes: Detailed review of medical records (labs, imaging, vital signs), medically appropriate exam (mental status, respiratory, cardiac, skin), discussed with treatment team, counseling and educating patient, family and staff, documenting clinical information, medication management and coordination of care.   Signed by: Leeanne Deed, DNP, AGNP-C Palliative Medicine    Please contact Palliative Medicine Team phone at [phone] for questions and concerns.  For individual provider: See Loretha Stapler

## 2023-06-06 NOTE — Assessment & Plan Note (Signed)
Pt required in/out cath this AM for bladder scan showing 492 cc and pt unable to void. --Place Foley given 2 in-out's required --Voiding trial when clinically appropriate

## 2023-06-07 ENCOUNTER — Inpatient Hospital Stay: Payer: Medicare HMO

## 2023-06-07 ENCOUNTER — Inpatient Hospital Stay (HOSPITAL_COMMUNITY)
Admit: 2023-06-07 | Discharge: 2023-06-07 | Disposition: A | Discharge status: Home / Self Care | Payer: Medicare HMO | Attending: Internal Medicine | Admitting: Internal Medicine

## 2023-06-07 DIAGNOSIS — R6520 Severe sepsis without septic shock: Secondary | ICD-10-CM | POA: Diagnosis not present

## 2023-06-07 DIAGNOSIS — A419 Sepsis, unspecified organism: Secondary | ICD-10-CM | POA: Diagnosis not present

## 2023-06-07 DIAGNOSIS — I4891 Unspecified atrial fibrillation: Secondary | ICD-10-CM

## 2023-06-07 LAB — COMPREHENSIVE METABOLIC PANEL
ALT: 150 U/L — ABNORMAL HIGH (ref 0–44)
ALT: 55 U/L — ABNORMAL HIGH (ref 0–44)
ALT: 124 U/L — ABNORMAL HIGH (ref 0–44)
ALT: 117 U/L — ABNORMAL HIGH (ref 0–44)
AST: 331 U/L — ABNORMAL HIGH (ref 15–41)
AST: 117 U/L — ABNORMAL HIGH (ref 15–41)
AST: 272 U/L — ABNORMAL HIGH (ref 15–41)
AST: 268 U/L — ABNORMAL HIGH (ref 15–41)
Albumin: 2 g/dL — ABNORMAL LOW (ref 3.5–5.0)
Albumin: <1.5 g/dL — ABNORMAL LOW (ref 3.5–5.0)
Albumin: 1.6 g/dL — ABNORMAL LOW (ref 3.5–5.0)
Albumin: 2.1 g/dL — ABNORMAL LOW (ref 3.5–5.0)
Alkaline Phosphatase: 756 U/L — ABNORMAL HIGH (ref 38–126)
Alkaline Phosphatase: 275 U/L — ABNORMAL HIGH (ref 38–126)
Alkaline Phosphatase: 646 U/L — ABNORMAL HIGH (ref 38–126)
Alkaline Phosphatase: 583 U/L — ABNORMAL HIGH (ref 38–126)
Anion gap: 15 (ref 5–15)
Anion gap: 16 — ABNORMAL HIGH (ref 5–15)
Anion gap: 14 (ref 5–15)
Anion gap: 15 (ref 5–15)
BUN: 67 mg/dL — ABNORMAL HIGH (ref 8–23)
BUN: 37 mg/dL — ABNORMAL HIGH (ref 8–23)
BUN: 70 mg/dL — ABNORMAL HIGH (ref 8–23)
BUN: 75 mg/dL — ABNORMAL HIGH (ref 8–23)
CO2: 19 mmol/L — ABNORMAL LOW (ref 22–32)
CO2: 9 mmol/L — ABNORMAL LOW (ref 22–32)
CO2: 18 mmol/L — ABNORMAL LOW (ref 22–32)
CO2: 17 mmol/L — ABNORMAL LOW (ref 22–32)
Calcium: 8 mg/dL — ABNORMAL LOW (ref 8.9–10.3)
Calcium: 6.4 mg/dL — CL (ref 8.9–10.3)
Calcium: 7.6 mg/dL — ABNORMAL LOW (ref 8.9–10.3)
Calcium: 7.4 mg/dL — ABNORMAL LOW (ref 8.9–10.3)
Chloride: 97 mmol/L — ABNORMAL LOW (ref 98–111)
Chloride: 104 mmol/L (ref 98–111)
Chloride: 102 mmol/L (ref 98–111)
Chloride: 101 mmol/L (ref 98–111)
Creatinine, Ser: 2.32 mg/dL — ABNORMAL HIGH (ref 0.61–1.24)
Creatinine, Ser: 1.03 mg/dL (ref 0.61–1.24)
Creatinine, Ser: 2.47 mg/dL — ABNORMAL HIGH (ref 0.61–1.24)
Creatinine, Ser: 2.67 mg/dL — ABNORMAL HIGH (ref 0.61–1.24)
GFR, Estimated: 28 mL/min — ABNORMAL LOW (ref >60)
GFR, Estimated: >60 mL/min (ref >60)
GFR, Estimated: 26 mL/min — ABNORMAL LOW (ref >60)
GFR, Estimated: 24 mL/min — ABNORMAL LOW (ref >60)
Glucose, Bld: 140 mg/dL — ABNORMAL HIGH (ref 70–99)
Glucose, Bld: 66 mg/dL — ABNORMAL LOW (ref 70–99)
Glucose, Bld: 125 mg/dL — ABNORMAL HIGH (ref 70–99)
Glucose, Bld: 160 mg/dL — ABNORMAL HIGH (ref 70–99)
Potassium: 5.2 mmol/L — ABNORMAL HIGH (ref 3.5–5.1)
Potassium: 4.5 mmol/L (ref 3.5–5.1)
Potassium: 5.4 mmol/L — ABNORMAL HIGH (ref 3.5–5.1)
Potassium: 5.2 mmol/L — ABNORMAL HIGH (ref 3.5–5.1)
Sodium: 131 mmol/L — ABNORMAL LOW (ref 135–145)
Sodium: 129 mmol/L — ABNORMAL LOW (ref 135–145)
Sodium: 134 mmol/L — ABNORMAL LOW (ref 135–145)
Sodium: 133 mmol/L — ABNORMAL LOW (ref 135–145)
Total Bilirubin: 6.4 mg/dL — ABNORMAL HIGH (ref <1.2)
Total Bilirubin: 2.4 mg/dL — ABNORMAL HIGH (ref <1.2)
Total Bilirubin: 5.4 mg/dL — ABNORMAL HIGH (ref <1.2)
Total Bilirubin: 5.8 mg/dL — ABNORMAL HIGH (ref <1.2)
Total Protein: 5.6 g/dL — ABNORMAL LOW (ref 6.5–8.1)
Total Protein: <3 g/dL — ABNORMAL LOW (ref 6.5–8.1)
Total Protein: 4.8 g/dL — ABNORMAL LOW (ref 6.5–8.1)
Total Protein: 5 g/dL — ABNORMAL LOW (ref 6.5–8.1)

## 2023-06-07 LAB — LACTIC ACID, PLASMA
Lactic Acid, Venous: 3.3 mmol/L (ref 0.5–1.9)
Lactic Acid, Venous: >9 mmol/L (ref 0.5–1.9)
Lactic Acid, Venous: 4.1 mmol/L (ref 0.5–1.9)
Lactic Acid, Venous: 4.2 mmol/L (ref 0.5–1.9)

## 2023-06-07 LAB — GLUCOSE, CAPILLARY
Glucose-Capillary: 128 mg/dL — ABNORMAL HIGH (ref 70–99)
Glucose-Capillary: 100 mg/dL — ABNORMAL HIGH (ref 70–99)
Glucose-Capillary: 89 mg/dL (ref 70–99)
Glucose-Capillary: 121 mg/dL — ABNORMAL HIGH (ref 70–99)
Glucose-Capillary: 142 mg/dL — ABNORMAL HIGH (ref 70–99)

## 2023-06-07 LAB — ECHOCARDIOGRAM COMPLETE
Height: 71 in
S' Lateral: 2.1 cm
Weight: 3964.75 [oz_av]

## 2023-06-07 LAB — BLOOD GAS, ARTERIAL
Acid-base deficit: 7.6 mmol/L — ABNORMAL HIGH (ref 0.0–2.0)
Acid-base deficit: 6.2 mmol/L — ABNORMAL HIGH (ref 0.0–2.0)
Bicarbonate: 15.7 mmol/L — ABNORMAL LOW (ref 20.0–28.0)
Bicarbonate: 17.9 mmol/L — ABNORMAL LOW (ref 20.0–28.0)
FIO2: 21 %
O2 Saturation: 97.1 %
O2 Saturation: 95.1 %
Patient temperature: 37
Patient temperature: 37
pCO2 arterial: 26 mm[Hg] — ABNORMAL LOW (ref 32–48)
pCO2 arterial: 31 mm[Hg] — ABNORMAL LOW (ref 32–48)
pH, Arterial: 7.39 (ref 7.35–7.45)
pH, Arterial: 7.37 (ref 7.35–7.45)
pO2, Arterial: 81 mm[Hg] — ABNORMAL LOW (ref 83–108)
pO2, Arterial: 71 mm[Hg] — ABNORMAL LOW (ref 83–108)

## 2023-06-07 LAB — MRSA NEXT GEN BY PCR, NASAL: MRSA by PCR Next Gen: NOT DETECTED

## 2023-06-07 LAB — CBC
HCT: 41.1 % (ref 39.0–52.0)
Hemoglobin: 14.3 g/dL (ref 13.0–17.0)
MCH: 27.2 pg (ref 26.0–34.0)
MCHC: 34.8 g/dL (ref 30.0–36.0)
MCV: 78.3 fL — ABNORMAL LOW (ref 80.0–100.0)
Platelets: 324 10*3/uL (ref 150–400)
RBC: 5.25 MIL/uL (ref 4.22–5.81)
RDW: 21.6 % — ABNORMAL HIGH (ref 11.5–15.5)
WBC: 16.3 10*3/uL — ABNORMAL HIGH (ref 4.0–10.5)
nRBC: 0 % (ref 0.0–0.2)

## 2023-06-07 LAB — HEPARIN LEVEL (UNFRACTIONATED)
Heparin Unfractionated: 0.4 [IU]/mL (ref 0.30–0.70)
Heparin Unfractionated: 0.5 [IU]/mL (ref 0.30–0.70)

## 2023-06-07 MED ORDER — SODIUM CHLORIDE 0.9 % IV SOLN
250.0000 mL | INTRAVENOUS | Status: AC
Start: 2023-06-07 — End: 2023-06-08
  Administered 2023-06-07: 250 mL via INTRAVENOUS

## 2023-06-07 MED ORDER — LORAZEPAM 2 MG/ML IJ SOLN
INTRAMUSCULAR | Status: AC
  Filled 2023-06-07: qty 1

## 2023-06-07 MED ORDER — INSULIN ASPART 100 UNIT/ML IV SOLN
10.0000 [IU] | Freq: Once | INTRAVENOUS | Status: AC
  Administered 2023-06-07: 10 [IU] via INTRAVENOUS
  Filled 2023-06-07: qty 0.1

## 2023-06-07 MED ORDER — LABETALOL HCL 5 MG/ML IV SOLN
0.5000 mg/min | Status: DC
  Administered 2023-06-07: 0.5 mg/min via INTRAVENOUS
  Filled 2023-06-07: qty 80

## 2023-06-07 MED ORDER — SODIUM ZIRCONIUM CYCLOSILICATE 5 G PO PACK
10.0000 g | PACK | Freq: Two times a day (BID) | ORAL | Status: AC
  Filled 2023-06-07: qty 1

## 2023-06-07 MED ORDER — CHLORHEXIDINE GLUCONATE CLOTH 2 % EX PADS
6.0000 | MEDICATED_PAD | Freq: Every day | CUTANEOUS | Status: DC
  Administered 2023-06-07 – 2023-06-08 (×2): 6 via TOPICAL

## 2023-06-07 MED ORDER — STERILE WATER FOR INJECTION IV SOLN
INTRAVENOUS | Status: DC
  Filled 2023-06-07: qty 150
  Filled 2023-06-07 (×2): qty 1000
  Filled 2023-06-07: qty 150
  Filled 2023-06-07: qty 1000

## 2023-06-07 MED ORDER — MORPHINE SULFATE (PF) 4 MG/ML IV SOLN: 3.0000 mg | Freq: Once | INTRAVENOUS | Status: DC

## 2023-06-07 MED ORDER — HYDROCORTISONE SOD SUC (PF) 100 MG IJ SOLR
100.0000 mg | Freq: Three times a day (TID) | INTRAMUSCULAR | Status: DC
  Administered 2023-06-07 – 2023-06-09 (×5): 100 mg via INTRAVENOUS
  Filled 2023-06-07 (×5): qty 2

## 2023-06-07 MED ORDER — ALBUMIN HUMAN 25 % IV SOLN
25.0000 g | Freq: Once | INTRAVENOUS | Status: AC
  Administered 2023-06-07: 25 g via INTRAVENOUS
  Filled 2023-06-07: qty 100

## 2023-06-07 MED ORDER — TECHNETIUM TC 99M MEBROFENIN IV KIT
7.5000 | PACK | Freq: Once | INTRAVENOUS | Status: AC | PRN
  Administered 2023-06-07: 7.1 via INTRAVENOUS

## 2023-06-07 MED ORDER — MORPHINE BOLUS VIA INFUSION: 3.0000 mg | Freq: Once | INTRAVENOUS | Status: DC

## 2023-06-07 MED ORDER — SODIUM CHLORIDE 0.9 % IV BOLUS
500.0000 mL | Freq: Once | INTRAVENOUS | Status: AC
  Administered 2023-06-07: 500 mL via INTRAVENOUS

## 2023-06-07 MED ORDER — BISACODYL 5 MG PO TBEC
5.0000 mg | DELAYED_RELEASE_TABLET | Freq: Once | ORAL | Status: AC
  Administered 2023-06-07: 5 mg via ORAL
  Filled 2023-06-07: qty 1

## 2023-06-07 MED ORDER — MORPHINE SULFATE (PF) 4 MG/ML IV SOLN: 3.0000 mg | Freq: Once | INTRAVENOUS | Status: AC

## 2023-06-07 MED ORDER — MORPHINE SULFATE (PF) 4 MG/ML IV SOLN
INTRAVENOUS | Status: AC
  Administered 2023-06-07: 3 mg via INTRAVENOUS
  Filled 2023-06-07: qty 1

## 2023-06-07 MED ORDER — PHENYLEPHRINE HCL-NACL 20-0.9 MG/250ML-% IV SOLN
25.0000 ug/min | INTRAVENOUS | Status: DC
  Administered 2023-06-07: 25 ug/min via INTRAVENOUS
  Administered 2023-06-08 (×3): 45 ug/min via INTRAVENOUS
  Administered 2023-06-09: 25 ug/min via INTRAVENOUS
  Filled 2023-06-07 (×5): qty 250

## 2023-06-07 MED ORDER — LACTATED RINGERS IV BOLUS
1000.0000 mL | Freq: Once | INTRAVENOUS | Status: AC
  Administered 2023-06-07: 1000 mL via INTRAVENOUS

## 2023-06-07 MED ORDER — SODIUM BICARBONATE 8.4 % IV SOLN
100.0000 meq | Freq: Once | INTRAVENOUS | Status: AC
  Administered 2023-06-07: 100 meq via INTRAVENOUS
  Filled 2023-06-07: qty 50

## 2023-06-07 MED ORDER — DEXTROSE 50 % IV SOLN
1.0000 | Freq: Once | INTRAVENOUS | Status: AC
  Administered 2023-06-07: 50 mL via INTRAVENOUS
  Filled 2023-06-07: qty 50

## 2023-06-07 MED ORDER — LORAZEPAM 2 MG/ML IJ SOLN
0.5000 mg | Freq: Once | INTRAMUSCULAR | Status: AC
  Administered 2023-06-07: 0.5 mg via INTRAVENOUS

## 2023-06-07 NOTE — Consult Note (Signed)
PHARMACY - ANTICOAGULATION CONSULT NOTE  Pharmacy Consult for heparin infusion Indication: atrial fibrillation  Allergies  Allergen Reactions   Actos [Pioglitazone] Hives   Amoxicillin Hives and Rash    Patient Measurements: Height: 5\' 11"  (180.3 cm) Weight: 112.4 kg (247 lb 12.8 oz) IBW/kg (Calculated) : 75.3 Heparin Dosing Weight: 97.6  Vital Signs: Temp: 97.3 F (36.3 C) (11/04 0335) BP: 102/79 (11/04 0335) Pulse Rate: 143 (11/04 0335)  Labs: Recent Labs    06/14/2023 1520 06/25/2023 2102 06/05/23 0049 06/05/23 0409 06/05/23 0842 06/05/23 2119 06/06/23 0547 06/06/23 1250 06/06/23 1552 06/06/23 2344 06/07/23 0557  HGB 15.2 13.9  --   --   --   --  14.1  --   --   --  14.3  HCT 45.0 40.6  --   --   --   --  40.3  --   --   --  41.1  PLT 329 300  --   --   --   --  313  --   --   --  324  APTT  --   --   --   --   --   --   --   --  37*  --   --   LABPROT 17.9*  --   --  18.7*  --   --   --   --  19.9*  --   --   INR 1.5*  --   --  1.5*  --   --   --   --  1.7*  --   --   HEPARINUNFRC  --   --   --   --   --   --   --   --   --  0.40 0.50  CREATININE 1.89* 1.97*   < > 2.00* 1.87* 1.87* 1.91*  --   --   --   --   TROPONINIHS  --   --   --   --   --   --   --  40*  --   --   --    < > = values in this interval not displayed.    Estimated Creatinine Clearance: 41.9 mL/min (A) (by C-G formula based on SCr of 1.91 mg/dL (H)).   Medical History: Past Medical History:  Diagnosis Date   Anemia    Arthritis    Coronary artery disease    Diabetes mellitus without complication (HCC)    Diverticulosis    Duodenal ulcer    Erectile dysfunction    GERD (gastroesophageal reflux disease)    Hyperlipidemia    Hypertension     Medications:  No home anticoagulants per pharmacist review.  Patient was on enoxaparin 52.5 mg SubQ every 24 hours with last dose 11/2 @ 2133  Assessment: 77 yo male with PMH including DM, CAD, HTN, CKD, and metastatic cancer involving the  liver.  Patient admitted for treatment of sepsis.   Patient also found to have abnormal EKG readings found to be Aflutter.  Pharmacy consulted to initiate heparin infusion.  Goal of Therapy:  Heparin level 0.3-0.7 units/ml Monitor platelets by anticoagulation protocol: Yes   11/03 2344 HL 0.40, therapeutic x 1 11/04 0557 HL 0.50, therapeutic x 2  Plan:  Continue heparin infusion at 1350 units/hr Recheck HL w/ AM labs daily while therapeutic CBC daily while on heparin  Otelia Sergeant, PharmD, [facility] 06/07/2023 6:37 AM

## 2023-06-07 NOTE — TOC CM/SW Note (Signed)
Transition of Care [facility]) - Inpatient Brief Assessment   Patient Details  Name: Jordan [name] MRN: [id number] Date of Birth: [birth year]  Transition of Care [facility]) CM/SW Contact:    Margarito Liner, LCSW Phone Number: 06/07/2023, 1:17 PM   Clinical Narrative: CSW reviewed chart. No TOC needs identified so far. CSW will continue to follow progress. Please place [facility] consult if any needs arise.  Transition of Care Asessment: Insurance and Status: Insurance coverage has been reviewed Patient has primary care physician: Yes Home environment has been reviewed: Single family home Prior level of function:: Not documented Prior/Current Home Services: No current home services Social Determinants of Health Reivew: SDOH reviewed no interventions necessary Readmission risk has been reviewed: Yes Transition of care needs: no transition of care needs at this time

## 2023-06-07 NOTE — Progress Notes (Signed)
Patient with anxiety and having difficulty completing HIDA scan. Dr. Truitt Merle and medication given.

## 2023-06-07 NOTE — IPAL (Signed)
  Interdisciplinary Goals of Care Family Meeting   Date carried out: 06/07/2023  Location of the meeting: Bedside  Member's involved: Nurse Practitioner and Family Member or next of kin  Durable Power of Attorney or acting medical decision maker: patient and spouse- Jordan [name]    Discussion: We discussed goals of care for Jordan [name]. Patient with new diagnosis of metastatic cancer due to unknown primary source. Recent lab work concerning for worsening AKI, AGMA, lactic acidosis and patient on vasopressor support in circulatory shock. Spouse, niece and patient were able to discuss GOC and patient is adamant that he would not want any form of Dialysis, would not want ACLS/CPR intervention or to be placed on mechanical ventilatory support. CODE status adjusted.  Code status:   Code Status: Limited: Do not attempt resuscitation (DNR) -DNR-LIMITED -Do Not Intubate/DNI    Disposition: Continue current acute care  Time spent for the meeting: 15 minutes    Cecelia Byars Rust-Chester, NP  06/07/2023, 8:09 PM  Cheryll Cockayne Rust-Chester, AGACNP-BC Acute Care Nurse Practitioner  Pulmonary & Critical Care   [phone] / [phone] Please see Amion for details.

## 2023-06-07 NOTE — Consult Note (Signed)
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  Patient was recently evaluated in the clinic for multiple liver lesions of unclear primary.  Patient underwent ultrasound-guided liver biopsy that showed adenocarcinoma-of unclear primary [possibly GI].  Cancer tumor markers CEA/CA 19-9 significantly elevated.  Patient was supposed to get Mediport placement when he was noted to have hypotension/tachycardia-and admitted hospital for sepsis workup.  Patient also noted to have significant elevation of bilirubin along with transaminitis.  Patient underwent MRCP without any clear obstructive signs.  Patient also underwent HIDA scan.   Patient noted to have atrial fibrillation/flutter currently on IV heparin.  Given the persistent hypotension and ongoing tachycardia patient being transferred to ICU.   Review of Systems  Constitutional:  Positive for chills, malaise/fatigue and weight loss. Negative for diaphoresis and fever.  HENT:  Negative for nosebleeds and sore throat.   Eyes:  Negative for double vision.  Respiratory:  Negative for cough, hemoptysis, sputum production, shortness of breath and wheezing.   Cardiovascular:  Positive for leg swelling. Negative for chest pain, palpitations and orthopnea.  Gastrointestinal:  Positive for abdominal pain, constipation and heartburn. Negative for blood in stool, diarrhea, melena, nausea and vomiting.  Genitourinary:  Negative for dysuria, frequency and urgency.  Musculoskeletal:  Positive for back  pain and joint pain.  Skin: Negative.  Negative for itching and rash.  Neurological:  Negative for dizziness, tingling, focal weakness, weakness and headaches.  Endo/Heme/Allergies:  Does not bruise/bleed easily.  Psychiatric/Behavioral:  Negative for depression. The patient is not nervous/anxious and does not have insomnia.     MEDICAL HISTORY:  Past Medical History:  Diagnosis Date   Anemia    Arthritis    Coronary artery disease    Diabetes mellitus without complication (HCC)    Diverticulosis    Duodenal ulcer    Erectile dysfunction    GERD (gastroesophageal reflux disease)    Hyperlipidemia    Hypertension     SURGICAL HISTORY: Past Surgical History:  Procedure Laterality Date   CARDIAC CATHETERIZATION     COLONOSCOPY WITH PROPOFOL N/A 05/31/2017   Procedure: COLONOSCOPY WITH PROPOFOL;  Surgeon: Scot Jun, MD;  Location: [facility] ENDOSCOPY;  Service: Endoscopy;  Laterality: N/A;   JOINT REPLACEMENT Left    knee   THORACIC DISCECTOMY N/A 08/09/2019   Procedure: T11-12 POSTERIOR FUSION WITH TRANSPEDICULAR DECOMPRESSION;  Surgeon: Venetia Night, MD;  Location: [facility] ORS;  Service: Neurosurgery;  Laterality: N/A;   VASECTOMY      SOCIAL HISTORY: Social History   Socioeconomic History   Marital status: Married    Spouse name: Not on file   Number of children: Not on file   Years of education: Not on file   Highest education level: Not on file  Occupational History   Not on file  Tobacco Use   Smoking status: Former    Current packs/day: 0.00    Types: Cigarettes    Quit date: 11/02/1987    Years since quitting: 35.6   Smokeless tobacco: Never  Vaping Use   Vaping status: Never Used  Substance and Sexual Activity  Alcohol use: No   Drug use: No   Sexual activity: Not on file  Other Topics Concern   Not on file  Social History Narrative   Not on file   Social Determinants of Health   Financial Resource Strain: Not on file  Food Insecurity: No  Food Insecurity (06/05/2023)   Hunger Vital Sign    Worried About Running Out of Food in the Last Year: Never true    Ran Out of Food in the Last Year: Never true  Transportation Needs: No Transportation Needs (06/05/2023)   PRAPARE - Administrator, Civil Service (Medical): No    Lack of Transportation (Non-Medical): No  Physical Activity: Unknown (01/11/2018)   Received from [facility]   Exercise Vital Sign    Days of Exercise per Week: 3 days    Minutes of Exercise per Session: Not on file  Stress: Not on file  Social Connections: Not on file  Intimate Partner Violence: Not At Risk (06/05/2023)   Humiliation, Afraid, Rape, and Kick questionnaire    Fear of Current or Ex-Partner: No    Emotionally Abused: No    Physically Abused: No    Sexually Abused: No    FAMILY HISTORY: Family History  Problem Relation Age of Onset   Hypertension Mother     ALLERGIES:  is allergic to actos [pioglitazone] and amoxicillin.  MEDICATIONS:  Current Facility-Administered Medications  Medication Dose Route Frequency Provider Last Rate Last Admin   0.9 %  sodium chloride infusion  250 mL Intravenous Continuous Erin Fulling, MD 10 mL/hr at 06/07/23 1707 250 mL at 06/07/23 1707   acetaminophen (TYLENOL) tablet 650 mg  650 mg Oral Q6H PRN Andris Baumann, MD       Or   acetaminophen (TYLENOL) suppository 650 mg  650 mg Rectal Q6H PRN Andris Baumann, MD       albumin human 25 % solution 25 g  25 g Intravenous Once Rust-Chester, Micheline Rough L, NP       And   sodium chloride 0.9 % bolus 500 mL  500 mL Intravenous Once Rust-Chester, Cecelia Byars, NP       bisacodyl (DULCOLAX) EC tablet 5 mg  5 mg Oral Daily PRN Esaw Grandchild A, DO       ceFEPIme (MAXIPIME) 2 g in sodium chloride 0.9 % 100 mL IVPB  2 g Intravenous Q12H Foye Deer, RPH 200 mL/hr at 06/07/23 1043 2 g at 06/07/23 1043   Chlorhexidine Gluconate Cloth 2 % PADS 6 each  6 each Topical Daily Esaw Grandchild A, DO    6 each at 06/07/23 1400   heparin ADULT infusion 100 units/mL (25000 units/273mL)  1,350 Units/hr Intravenous Continuous Barrie Folk, RPH 13.5 mL/hr at 06/07/23 0649 1,350 Units/hr at 06/07/23 0649   HYDROcodone-acetaminophen (NORCO/VICODIN) 5-325 MG per tablet 1-2 tablet  1-2 tablet Oral Q4H PRN Esaw Grandchild A, DO   1 tablet at 06/06/23 2147   hydrocortisone sodium succinate (SOLU-CORTEF) 100 MG injection 100 mg  100 mg Intravenous Q8H Kasa, Wallis Bamberg, MD   100 mg at 06/07/23 2029   insulin aspart (novoLOG) injection 0-15 Units  0-15 Units Subcutaneous TID WC Barrie Folk, [facility]   2 Units at 06/07/23 4782   magnesium hydroxide (MILK OF MAGNESIA) suspension 15 mL  15 mL Oral Daily PRN Esaw Grandchild A, DO       metoprolol tartrate (LOPRESSOR) injection 5 mg  5 mg Intravenous Q4H PRN Denton Lank,  Kelly A, DO   5 mg at 06/06/23 0140   metroNIDAZOLE (FLAGYL) IVPB 500 mg  500 mg Intravenous Q12H Lindajo Royal V, MD 100 mL/hr at 06/07/23 1152 500 mg at 06/07/23 1152   midodrine (PROAMATINE) tablet 5 mg  5 mg Oral BID WC Callwood, Dwayne D, MD   5 mg at 06/07/23 0814   phenylephrine (NEO-SYNEPHRINE) 20mg /NS premix infusion  25-200 mcg/min Intravenous Titrated Erin Fulling, MD 33.8 mL/hr at 06/07/23 2003 45 mcg/min at 06/07/23 2003   polyethylene glycol (MIRALAX / GLYCOLAX) packet 17 g  17 g Oral Daily Esaw Grandchild A, DO   17 g at 06/06/23 1253   senna-docusate (Senokot-S) tablet 1 tablet  1 tablet Oral BID Esaw Grandchild A, DO   1 tablet at 06/07/23 0814   sodium bicarbonate 150 mEq in sterile water 1,150 mL infusion   Intravenous Continuous Ezequiel Essex, NP 75 mL/hr at 06/07/23 1712 New Bag at 06/07/23 1712   sodium zirconium cyclosilicate (LOKELMA) packet 10 g  10 g Oral BID Esaw Grandchild A, DO        PHYSICAL EXAMINATION:   Vitals:   06/07/23 1800 06/07/23 2000  BP: (!) 87/62 (!) 76/61  Pulse: (!) 130 (!) 129  Resp: 16 (!) 22  Temp:    SpO2: 93% 96%   Filed Weights    07/02/2023 2233 06/07/23 0410  Weight: 233 lb (105.7 kg) 247 lb 12.8 oz (112.4 kg)   Positive for jaundice.  Physical Exam Vitals and nursing note reviewed.  HENT:     Head: Normocephalic and atraumatic.     Mouth/Throat:     Pharynx: Oropharynx is clear.  Eyes:     Extraocular Movements: Extraocular movements intact.     Pupils: Pupils are equal, round, and reactive to light.  Cardiovascular:     Rate and Rhythm: Tachycardia present. Rhythm irregular.  Pulmonary:     Comments: Decreased breath sounds bilaterally.  Abdominal:     Palpations: Abdomen is soft.  Musculoskeletal:        General: Normal range of motion.     Cervical back: Normal range of motion.  Skin:    General: Skin is warm.  Neurological:     General: No focal deficit present.     Mental Status: He is alert and oriented to person, place, and time.  Psychiatric:        Behavior: Behavior normal.        Judgment: Judgment normal.     LABORATORY DATA:  I have reviewed the data as listed Lab Results  Component Value Date   WBC 16.3 (H) 06/07/2023   HGB 14.3 06/07/2023   HCT 41.1 06/07/2023   MCV 78.3 (L) 06/07/2023   PLT 324 06/07/2023   Recent Labs    06/07/23 0557 06/07/23 1632 06/07/23 1830  NA 131* 129* 134*  K 5.2* 4.5 5.4*  CL 97* 104 102  CO2 19* 9* 18*  GLUCOSE 140* 66* 125*  BUN 67* 37* 70*  CREATININE 2.32* 1.03 2.47*  CALCIUM 8.0* 6.4* 7.6*  GFRNONAA 28* >60 26*  PROT 5.6* <3.0* 4.8*  ALBUMIN 2.0* <1.5* 1.6*  AST 331* 117* 272*  ALT 150* 55* 124*  ALKPHOS 756* 275* 646*  BILITOT 6.4* 2.4* 5.4*    RADIOGRAPHIC STUDIES: I have personally reviewed the radiological images as listed and agreed with the findings in the report. NM Hepatobiliary Liver Func  Result Date: 06/07/2023 CLINICAL DATA:  Liver metastasis. Abdominal pain. Elevated bilirubin. EXAM:  NUCLEAR MEDICINE HEPATOBILIARY IMAGING TECHNIQUE: Sequential images of the abdomen were obtained out to 60 minutes following  intravenous administration of radiopharmaceutical. RADIOPHARMACEUTICALS:  7.1 mCi Tc-48m  Choletec IV COMPARISON:  MRI 06/03/2023, ultrasound 06/14/2023 FINDINGS: Heterogeneous uptake radiotracer in liver consistent with multiple hepatic metastasis. Counts into the small bowel by 20 minutes. Gallbladder is not clearly identified. IV morphine was administered. No evidence of gallbladder filling. IMPRESSION: 1. The gallbladder is not confidently identified post morphine however, the gallbladder is severely contracted on comparison ultrasound and MRI. Recommend clinical correlation for acute cholecystitis. 2. Heterogeneous abnormal pattern of radiotracer accumulation in the liver consistent multifocal hepatic metastasis. 3. Patent common bile duct. Electronically Signed   By: Genevive Bi M.D.   On: 06/07/2023 19:53   ECHOCARDIOGRAM COMPLETE  Result Date: 06/07/2023    ECHOCARDIOGRAM REPORT   Patient Name:   Jordan [name] Date of Exam: 06/07/2023 Medical Rec #:  [id number]   Height:       71.0 in Accession #:    [id number]  Weight:       247.8 lb Date of Birth:  05/29/1946   BSA:          2.310 m Patient Age:    77 years    BP:           108/72 mmHg Patient Gender: M           HR:           139 bpm. Exam Location:  [facility] Procedure: 2D Echo, Cardiac Doppler and Color Doppler Indications:     Atrial Flutter  History:         Patient has no prior history of Echocardiogram examinations.                  CAD, Abnormal ECG, Arrythmias:Atrial Flutter,                  Signs/Symptoms:Dizziness/Lightheadedness; Risk                  Factors:Hypertension, Diabetes and Dyslipidemia. CKD, CA with                  mets.  Sonographer:     Mikki Harbor Referring Phys:  [id number] KELLY A GRIFFITH Diagnosing Phys: Rozell Searing Custovic  Sonographer Comments: Technically difficult study due to poor echo windows, no apical window, suboptimal parasternal window and patient is obese. Image acquisition challenging due to respiratory motion.  IMPRESSIONS  1. Left ventricular ejection fraction, by estimation, is 55 to 60%. Left ventricular ejection fraction by PLAX is 67 %. The left ventricle has normal function. The left ventricle has no regional wall motion abnormalities. Left ventricular diastolic parameters are indeterminate.  2. Right ventricular systolic function is normal. The right ventricular size is normal.  3. The mitral valve is grossly normal. Trivial mitral valve regurgitation.  4. The aortic valve is grossly normal. Aortic valve regurgitation is not visualized. FINDINGS  Left Ventricle: Left ventricular ejection fraction, by estimation, is 55 to 60%. Left ventricular ejection fraction by PLAX is 67 %. The left ventricle has normal function. The left ventricle has no regional wall motion abnormalities. The left ventricular internal cavity size was normal in size. There is no left ventricular hypertrophy. Left ventricular diastolic parameters are indeterminate. Right Ventricle: The right ventricular size is normal. No increase in right ventricular wall thickness. Right ventricular systolic function is normal. Left Atrium: Left atrial size was normal in size. Right Atrium: Right atrial size  was normal in size. Pericardium: There is no evidence of pericardial effusion. Mitral Valve: The mitral valve is grossly normal. Trivial mitral valve regurgitation. Tricuspid Valve: The tricuspid valve is grossly normal. Tricuspid valve regurgitation is trivial. Aortic Valve: The aortic valve is grossly normal. Aortic valve regurgitation is not visualized. Pulmonic Valve: The pulmonic valve was grossly normal. Pulmonic valve regurgitation is not visualized. Aorta: The aortic root was not well visualized. IAS/Shunts: No atrial level shunt detected by color flow Doppler.  LEFT VENTRICLE PLAX 2D LV EF:         Left ventricular ejection fraction by PLAX is 67 %. LVIDd:         3.30 cm LVIDs:         2.10 cm LV PW:         1.80 cm LV IVS:        1.60 cm LVOT  diam:     2.00 cm LVOT Area:     3.14 cm  LEFT ATRIUM         Index LA diam:    4.10 cm 1.78 cm/m                        PULMONIC VALVE AORTA                 PV Vmax:       0.71 m/s Ao Root diam: 4.00 cm PV Peak grad:  2.0 mmHg   SHUNTS Systemic Diam: 2.00 cm Rozell Searing Custovic Electronically signed by Clotilde Dieter Signature Date/Time: 06/07/2023/2:10:21 PM    Final    DG Chest Port 1 View  Result Date: 06/06/2023 CLINICAL DATA:  Dyspnea EXAM: PORTABLE CHEST 1 VIEW COMPARISON:  Chest x-ray dated June 04, 2023 FINDINGS: Cardiac and mediastinal contours are unchanged when accounting for differences in lung volumes. Mild right basilar opacity which is likely due to atelectasis. Lungs are otherwise clear. No large pleural effusion or pneumothorax. IMPRESSION: Mild right basilar opacity which is likely due to atelectasis. Electronically Signed   By: Allegra Lai M.D.   On: 06/06/2023 13:51   US Abdomen Limited RUQ (LIVER/GB)  Result Date: 06/13/2023 CLINICAL DATA:  Right upper quadrant pain EXAM: ULTRASOUND ABDOMEN LIMITED RIGHT UPPER QUADRANT COMPARISON:  Abdominal MRI dated June 03, 2023 FINDINGS: Gallbladder: Gallstones and sludge within contracted gallbladder. Gallbladder wall thickening, measuring up to 6 mm. No sonographic Murphy sign noted by sonographer. Common bile duct: Diameter: 5.2 mm Liver: Multiple hypoattenuating solid liver lesions. Reference lesion of the right hepatic lobe measuring 5.6 x 3.9 x 6.4 cm. Reference lesion of the left hepatic lobe measuring 3.1 x 3.0 x 3.6 cm. Nodular liver contour, likely due to liver lesions. Increased parenchymal echogenicity. Portal vein is patent on color Doppler imaging with normal direction of blood flow towards the liver. Other: Small volume abdominal ascites. IMPRESSION: 1. Multiple liver masses, consistent with metastatic disease. See previously performed cross-sectional imaging for further discussion. 2. Small volume abdominal ascites. 3.  Gallstones and sludge within contracted gallbladder. 4. Gallbladder wall thickening, likely secondary to liver disease. Electronically Signed   By: Allegra Lai M.D.   On: 06/27/2023 21:15   DG Chest Port 1 View  Result Date: 07/02/2023 CLINICAL DATA:  Sepsis workup.  Hypotension and tachycardia. EXAM: PORTABLE CHEST 1 VIEW COMPARISON:  CT 05/18/2023.  Radiographs 12/04/2021. FINDINGS: 1541 hours. The heart size and mediastinal contours are stable with aortic atherosclerosis. There is asymmetric elevation of the right hemidiaphragm with  mild bibasilar atelectasis. No edema, confluent airspace disease, pleural effusion or pneumothorax. Postsurgical changes noted within the lower thoracic spine. No acute osseous findings are seen. IMPRESSION: Mild bibasilar atelectasis. No evidence of pneumonia or edema. Electronically Signed   By: Carey Bullocks M.D.   On: 06/07/2023 18:16   MR ABDOMEN MRCP W WO CONTAST  Result Date: 06/03/2023 CLINICAL DATA:  Elevated liver enzymes, hepatic metastatic disease, history of liver biopsy, worsening abdominal pain, follow-up recent CT EXAM: MRI ABDOMEN WITHOUT AND WITH CONTRAST (INCLUDING MRCP) TECHNIQUE: Multiplanar multisequence MR imaging of the abdomen was performed both before and after the administration of intravenous contrast. Heavily T2-weighted images of the biliary and pancreatic ducts were obtained, and three-dimensional MRCP images were rendered by post processing. CONTRAST:  10mL GADAVIST GADOBUTROL 1 MMOL/ML IV SOLN COMPARISON:  CT abdomen pelvis, 05/31/2023 FINDINGS: Lower chest: Trace right pleural effusion, unchanged. Hepatobiliary: Hepatomegaly, maximum coronal span 24.2 cm. The liver is grossly enlarged by numerous bulky rim enhancing, internally hypoenhancing liver metastases as seen on prior examinations, not significantly changed. Large index lesion of the peripheral right lobe of the liver, hepatic segments VI/VII measures 10.0 x 5.5 cm (series 27,  image 32). Gallstones and sludge contracted in the gallbladder. No intra or extrahepatic biliary ductal dilatation. Pancreas: Unremarkable. No pancreatic ductal dilatation or surrounding inflammatory changes. Spleen: Normal in size without significant abnormality. Adrenals/Urinary Tract: Adrenal glands are unremarkable. Multiple simple, benign and thinly septated renal cortical cysts, for which no specific further follow-up or characterization is required. Lobulated cortical scarring of the right kidney with prominence of the right renal pelvis and associated mucosal thickening, similar to prior examination (series 29, image 65). No obvious calculi or hydronephrosis. Stomach/Bowel: Stomach is within normal limits. No evidence of bowel wall thickening, distention, or inflammatory changes. Vascular/Lymphatic: Aortic atherosclerosis. Enlarged portacaval and porta hepatis lymph nodes measuring up to 4.0 x 1.6 cm (series 27, image 49). Prominent subcentimeter retroperitoneal lymph nodes (series 29, image 59). Other: No abdominal wall hernia or abnormality. Small volume perihepatic and perisplenic ascites, similar to prior examination. Musculoskeletal: Diffusely heterogeneous enhancement of the included vertebral bodies. IMPRESSION: 1. The liver is grossly enlarged by numerous bulky rim enhancing, internally hypoenhancing liver metastases as seen on prior examinations, not significantly changed. 2. No biliary ductal dilatation. 3. Enlarged portacaval and porta hepatis lymph nodes measuring up to 4.0 x 1.6 cm. Prominent subcentimeter retroperitoneal lymph nodes. Findings are suspicious for nodal metastatic disease. 4. Diffusely heterogeneous enhancement of the included vertebral bodies, suspicious for osseous metastatic disease. 5. Small volume perihepatic and perisplenic ascites, similar to prior examination. Trace right pleural effusion, unchanged. 6. Lobulated cortical scarring of the right kidney with prominence of the  right renal pelvis and associated mucosal thickening, similar to prior examination. No obvious calculi or hydronephrosis. 7. Cholelithiasis. Aortic Atherosclerosis (ICD10-I70.0). Electronically Signed   By: Jearld Lesch M.D.   On: 06/03/2023 10:02   MR 3D Recon At Scanner  Result Date: 06/03/2023 CLINICAL DATA:  Elevated liver enzymes, hepatic metastatic disease, history of liver biopsy, worsening abdominal pain, follow-up recent CT EXAM: MRI ABDOMEN WITHOUT AND WITH CONTRAST (INCLUDING MRCP) TECHNIQUE: Multiplanar multisequence MR imaging of the abdomen was performed both before and after the administration of intravenous contrast. Heavily T2-weighted images of the biliary and pancreatic ducts were obtained, and three-dimensional MRCP images were rendered by post processing. CONTRAST:  10mL GADAVIST GADOBUTROL 1 MMOL/ML IV SOLN COMPARISON:  CT abdomen pelvis, 05/31/2023 FINDINGS: Lower chest: Trace right pleural effusion, unchanged. Hepatobiliary:  Hepatomegaly, maximum coronal span 24.2 cm. The liver is grossly enlarged by numerous bulky rim enhancing, internally hypoenhancing liver metastases as seen on prior examinations, not significantly changed. Large index lesion of the peripheral right lobe of the liver, hepatic segments VI/VII measures 10.0 x 5.5 cm (series 27, image 32). Gallstones and sludge contracted in the gallbladder. No intra or extrahepatic biliary ductal dilatation. Pancreas: Unremarkable. No pancreatic ductal dilatation or surrounding inflammatory changes. Spleen: Normal in size without significant abnormality. Adrenals/Urinary Tract: Adrenal glands are unremarkable. Multiple simple, benign and thinly septated renal cortical cysts, for which no specific further follow-up or characterization is required. Lobulated cortical scarring of the right kidney with prominence of the right renal pelvis and associated mucosal thickening, similar to prior examination (series 29, image 65). No obvious  calculi or hydronephrosis. Stomach/Bowel: Stomach is within normal limits. No evidence of bowel wall thickening, distention, or inflammatory changes. Vascular/Lymphatic: Aortic atherosclerosis. Enlarged portacaval and porta hepatis lymph nodes measuring up to 4.0 x 1.6 cm (series 27, image 49). Prominent subcentimeter retroperitoneal lymph nodes (series 29, image 59). Other: No abdominal wall hernia or abnormality. Small volume perihepatic and perisplenic ascites, similar to prior examination. Musculoskeletal: Diffusely heterogeneous enhancement of the included vertebral bodies. IMPRESSION: 1. The liver is grossly enlarged by numerous bulky rim enhancing, internally hypoenhancing liver metastases as seen on prior examinations, not significantly changed. 2. No biliary ductal dilatation. 3. Enlarged portacaval and porta hepatis lymph nodes measuring up to 4.0 x 1.6 cm. Prominent subcentimeter retroperitoneal lymph nodes. Findings are suspicious for nodal metastatic disease. 4. Diffusely heterogeneous enhancement of the included vertebral bodies, suspicious for osseous metastatic disease. 5. Small volume perihepatic and perisplenic ascites, similar to prior examination. Trace right pleural effusion, unchanged. 6. Lobulated cortical scarring of the right kidney with prominence of the right renal pelvis and associated mucosal thickening, similar to prior examination. No obvious calculi or hydronephrosis. 7. Cholelithiasis. Aortic Atherosclerosis (ICD10-I70.0). Electronically Signed   By: Jearld Lesch M.D.   On: 06/03/2023 10:02   CT ABDOMEN PELVIS WO CONTRAST  Result Date: 05/31/2023 CLINICAL DATA:  Abdominal pain EXAM: CT ABDOMEN AND PELVIS WITHOUT CONTRAST TECHNIQUE: Multidetector CT imaging of the abdomen and pelvis was performed following the standard protocol without IV contrast. RADIATION DOSE REDUCTION: This exam was performed according to the departmental dose-optimization program which includes automated  exposure control, adjustment of the mA and/or kV according to patient size and/or use of iterative reconstruction technique. COMPARISON:  05/07/2023 FINDINGS: Lower chest: Aortic and coronary artery atherosclerosis. Trace right pleural effusion. Hepatobiliary: Diffuse hypoattenuating masses throughout both lobes of the liver, increased in size compared to the recent prior CT. Liver measures approximately 24 cm in length. Sludge or small stones within the gallbladder. Gallbladder is partially contracted. Pancreas: No focal abnormality of the pancreas is identified on noncontrast imaging. No inflammatory changes. Spleen: Normal in size without focal abnormality. Adrenals/Urinary Tract: No adrenal nodules or masses. Urothelial thickening of the right renal pelvis is similar in appearance to prior. No stone or hydronephrosis. Urinary bladder within normal limits. Stomach/Bowel: Stomach is within normal limits. Colonic diverticulosis. No evidence of bowel wall thickening, distention, or inflammatory changes. Vascular/Lymphatic: Aortoiliac atherosclerosis without aneurysm. Several small retroperitoneal lymph nodes are similar to prior. No abdominopelvic lymphadenopathy by size criteria. Reproductive: Prostate is unremarkable. Other: Small volume ascites is new from prior. No pneumoperitoneum. Small fat containing umbilical hernia. Musculoskeletal: There are several small sclerotic lesions throughout the spine, bilateral ribs, and pelvis, similar to slightly increased compared to  prior. Reference lesion is a small sclerotic lesion within the T8 vertebral body (series 5, image 90). IMPRESSION: 1. Worsening hepatic metastatic disease. 2. Small volume ascites is new from prior. 3. Several small sclerotic lesions throughout the spine, bilateral ribs, and pelvis, similar to slightly increased compared to prior. Findings are compatible with osseous metastatic disease. 4. Urothelial thickening of the right renal pelvis is similar  in appearance to prior. Correlate with urinalysis. 5. Trace right pleural effusion. 6. Colonic diverticulosis without evidence of diverticulitis. No bowel obstruction. 7. Aortic and coronary artery atherosclerosis (ICD10-I70.0). Electronically Signed   By: Duanne Guess D.O.   On: 05/31/2023 18:30   CT CHEST W CONTRAST  Result Date: 05/27/2023 CLINICAL DATA:  Liver metastases on recent CT abdomen. * Tracking Code: BO * EXAM: CT CHEST WITH CONTRAST TECHNIQUE: Multidetector CT imaging of the chest was performed during intravenous contrast administration. RADIATION DOSE REDUCTION: This exam was performed according to the departmental dose-optimization program which includes automated exposure control, adjustment of the mA and/or kV according to patient size and/or use of iterative reconstruction technique. CONTRAST:  75mL OMNIPAQUE IOHEXOL 300 MG/ML  SOLN COMPARISON:  CT abdomen pelvis 05/07/2023 and CT chest 12/04/2021. FINDINGS: Cardiovascular: Atherosclerotic calcification of the aorta, aortic valve and coronary arteries. Ascending aorta measures up to 3.9 cm (4/71). Enlarged pulmonic trunk and heart. No pericardial effusion. Mediastinum/Nodes: No pathologically enlarged mediastinal, hilar or axillary lymph nodes. Esophagus is grossly unremarkable. Lungs/Pleura: Lungs are clear. No pleural fluid. Airway is unremarkable. Upper Abdomen: Mildly hypoattenuating masses throughout the liver with index mass in the right hepatic lobe measuring 6.5 x 8.3 cm (2/145). Visualized portions of the gallbladder, adrenal glands and right kidney are otherwise grossly unremarkable. There may be a left renal sinus cyst, partially imaged. No specific follow-up necessary. Visualized portions of the spleen, pancreas, stomach and bowel are grossly unremarkable. Scattered ascites. Periportal adenopathy measures up to 1.5 cm. Musculoskeletal: Degenerative changes in the spine. T11-12 posterior fusion. Scattered sclerotic lesions in  the visualized osseous structures, new from 12/04/2021. IMPRESSION: 1. Hepatic and osseous metastatic disease. No evidence of primary malignancy in the chest. 2. Scattered ascites. 3. Aortic atherosclerosis (ICD10-I70.0). Coronary artery calcification. 4. Enlarged pulmonic trunk, indicative of pulmonary arterial hypertension. Electronically Signed   By: Leanna Battles M.D.   On: 05/27/2023 13:45   US BIOPSY (LIVER)  Result Date: 05/27/2023 INDICATION: Multiple liver lesions with unknown primary malignancy. EXAM: ULTRASOUND GUIDED CORE BIOPSY OF LIVER MASS MEDICATIONS: None. ANESTHESIA/SEDATION: Moderate (conscious) sedation was employed during this procedure. A total of Versed 1.0 mg and Fentanyl 50 mcg was administered intravenously. Moderate Sedation Time: 10 minutes. The patient's level of consciousness and vital signs were monitored continuously by radiology nursing throughout the procedure under my direct supervision. PROCEDURE: The procedure, risks, benefits, and alternatives were explained to the patient. Questions regarding the procedure were encouraged and answered. The patient understands and consents to the procedure. A time-out was performed prior to initiating the procedure. Ultrasound was performed to localize liver lesions. The abdominal wall was prepped with chlorhexidine in a sterile fashion, and a sterile drape was applied covering the operative field. A sterile gown and sterile gloves were used for the procedure. Local anesthesia was provided with 1% Lidocaine. A 17 gauge trocar needle was advanced into the liver at the level of a right lobe liver lesion under ultrasound guidance. After confirming needle tip position, coaxial 18 gauge core biopsy samples were obtained and submitted in formalin. Three total core biopsy samples  were obtained. Gel-Foam pledgets were advanced through the outer needle prior to its removal. Additional ultrasound was performed. COMPLICATIONS: None immediate.  FINDINGS: Ultrasound demonstrates multiple circumscribed and ill-defined lesions within the liver. A lesion in the inferior right lobe measuring up to approximately 4.8 cm in maximum diameter was chosen for sampling. Solid core biopsy samples were obtained. IMPRESSION: Ultrasound-guided core biopsy performed of a mass within the right lobe of the liver measuring up to approximately 4.8 cm in maximum diameter. Electronically Signed   By: Irish Lack M.D.   On: 05/27/2023 13:81     # 77 year old male patient with new diagnosis of adenocarcinoma-metastatic liver/bone of unknown primary is currently admitted to hospital for hypotension tachycardia lactic acidosis.  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  Currently unknown clear primary however suspect GI origin.  Significant elevated CEA/CA 19-9 cancer markers.  # Elevated liver enzymes/bilirubin-likely secondary to significant tumor burden rather than obstructive disease.  # SIRS-question infection versus underlying malignancy.  Currently on broad-spectrum antibiotics infectious workup-in progress including HIDA scan.  # Worsening renal function/hyperkalemia  # Atrial flutter/fibrillation on IV heparin-status post cardiology evaluation.  # Recommendations/PLAN:  # Recommend inpatient PET scan when patient is clinically stable to identify the primary site of malignancy.  Discussed with radiology.  # I had a long since the patient's wife and niece, Ms.Honey [ICU, NP at [facility]]-regarding the patient's multiorgan dysfunction/and also the fact that patient's current situation is likely driven by underlying malignancy.  It is recommended patient would need to start chemotherapy [generic chemo like carbo-taxol vs gem-abraxane] once clinically stable.  They understand that patient is at high risk of complications from chemotherapy given his borderline organ function.  Thank you Dr.Griffith for allowing me to participate in the care  of your pleasant patient. Please do not hesitate to contact me with questions or concerns in the interim.  Discussed with Dr. Denton Lank.   Earna Coder, MD 06/07/2023 8:40 PM

## 2023-06-07 NOTE — Progress Notes (Signed)
Ultrasound at bedside, completed right leg. Patient to nuclear medicine for HIDA scan. Ultrasound will return to complete left leg.

## 2023-06-07 NOTE — IPAL (Signed)
  Interdisciplinary Goals of Care Family Meeting   Date carried out: 06/07/2023  Location of the meeting: Bedside  Member's involved: Physician, Bedside Registered Nurse, and Family Member or next of kin      GOALS OF CARE DISCUSSION  The Clinical status was relayed to family in detail- Wife Jordan [name] and Niece-Dr Frederich Chick ICU doc at [facility]  Updated and notified of patients medical condition- Patient remains unresponsive  Patient with increased WOB and using accessory muscles to breathe Explained to family course of therapy and the modalities   Patient with Progressive multiorgan failure with a very high probablity of a very minimal chance of meaningful recovery despite all aggressive and optimal medical therapy.  PATIENT REMAINS FULL CODE  Family understands the situation. With stage 4 cancer, patient is in the dying process I highly recommend DNR/DNI  status, family to discuss with each other  Family are satisfied with Plan of action and management. All questions answered  Additional CC time 35 mins   Jordan [name] [name], M.D.  [name]  Medical Director [facility] Medical Director [facility] Cardio-Pulmonary Department

## 2023-06-07 NOTE — Consult Note (Signed)
PHARMACY - ANTICOAGULATION CONSULT NOTE  Pharmacy Consult for heparin infusion Indication: atrial fibrillation  Allergies  Allergen Reactions   Actos [Pioglitazone] Hives   Amoxicillin Hives and Rash    Patient Measurements: Height: 5\' 11"  (180.3 cm) Weight: 105.7 kg (233 lb) IBW/kg (Calculated) : 75.3 Heparin Dosing Weight: 97.6  Vital Signs: Temp: 97.4 F (36.3 C) (11/03 2345) BP: 91/68 (11/03 2345) Pulse Rate: 139 (11/03 2345)  Labs: Recent Labs    06/30/2023 1520 06/13/2023 2102 06/05/23 0049 06/05/23 0409 06/05/23 0842 06/05/23 2119 06/06/23 0547 06/06/23 1250 06/06/23 1552 06/06/23 2344  HGB 15.2 13.9  --   --   --   --  14.1  --   --   --   HCT 45.0 40.6  --   --   --   --  40.3  --   --   --   PLT 329 300  --   --   --   --  313  --   --   --   APTT  --   --   --   --   --   --   --   --  37*  --   LABPROT 17.9*  --   --  18.7*  --   --   --   --  19.9*  --   INR 1.5*  --   --  1.5*  --   --   --   --  1.7*  --   HEPARINUNFRC  --   --   --   --   --   --   --   --   --  0.40  CREATININE 1.89* 1.97*   < > 2.00* 1.87* 1.87* 1.91*  --   --   --   TROPONINIHS  --   --   --   --   --   --   --  40*  --   --    < > = values in this interval not displayed.    Estimated Creatinine Clearance: 40.7 mL/min (A) (by C-G formula based on SCr of 1.91 mg/dL (H)).   Medical History: Past Medical History:  Diagnosis Date   Anemia    Arthritis    Coronary artery disease    Diabetes mellitus without complication (HCC)    Diverticulosis    Duodenal ulcer    Erectile dysfunction    GERD (gastroesophageal reflux disease)    Hyperlipidemia    Hypertension     Medications:  No home anticoagulants per pharmacist review.  Patient was on enoxaparin 52.5 mg SubQ every 24 hours with last dose 11/2 @ 2133  Assessment: 77 yo male with PMH including DM, CAD, HTN, CKD, and metastatic cancer involving the liver.  Patient admitted for treatment of sepsis.   Patient also found to  have abnormal EKG readings found to be Aflutter.  Pharmacy consulted to initiate heparin infusion.  Goal of Therapy:  Heparin level 0.3-0.7 units/ml Monitor platelets by anticoagulation protocol: Yes   11/03 2344 HL 0.40, therapeutic x 1  Plan:  Continue heparin infusion at 1350 units/hr Recheck HL w/ AM labs to confirm CBC daily while on heparin  Otelia Sergeant, PharmD, [facility] 06/07/2023 12:51 AM

## 2023-06-07 NOTE — Consult Note (Addendum)
NAME:  Jordan [name], MRN:  [id number], DOB:  Jan 30, 1946, LOS: 3 ADMISSION DATE:  06/08/2023  CHIEF COMPLAINT:  low BP and weakness    History of Present Illness:  77 y.o. male with medical history significant for DM, CAD, HTN, CKD llla, with fairly recent diagnosis of metastatic CA involving liver with unknown primary s/p biopsy who went for port placement with IR  11/1 but was sent to the ED due to concerns for protracted weakness and concerns for sepsis.    Patient had an E. coli UTI diagnosed on 10/11 patient was seen in the ED on 10/28 for RUQ pain and workup with CT was about the same as prior findings.    He did received an NS bolus for hyponatremia of 128 and AKI of 1.89.  WBC 17,000 but he did not meet sepsis criteria.    He was started on Keflex for UTI (had admission for urosepsis March 2024) but he has only taken 1 dose due to poor oral intake and generally feeling unwell.  He has vague abdominal pain Mostly in the periumbilical and lower abdomen going from left to right.  Denies vomiting, diarrhea, constipation or dysuria.  His main complaint is generalized malaise.   Patient was admitted and started on empiric broad spectrum antibiotics.   RUQ U/S showed multiple liver mets, small volume ascites, gallbladder sludge, CBD diameter 5.2 mm, no sonographic Murphy sign, gallgbladder wall thickening (aslo seen previously).   MRCP negative on 10/31 obtained per Oncologist. Negative for choledocholithiasis.  Showed extensive liver metastatic disease, lymphadenopathy likely nodal mets, enhancement of vertebral bodies suspicious for osseous mets.  Cholelithiasis.  Status post liver biopsy on 05/27/2023 showed poorly differentiated adenocarcinoma with extensive necrosis. IHC positive for CK7, CK20, CDX2, negative for GATA3, PAX8, TTF-1, S100 and CD1 1 7. Differentials include GI origin, pancreaticobiliary system, head and neck region. Intrahepatic cholangiocarcinoma cannot be entirely  excluded.     Significant Hospital Events: Including procedures, antibiotic start and stop dates in addition to other pertinent events   11/1 pt persistently tachycardic.  Lactic acid improving 6.2 >> 5.5 >> 3.2 11/2 ongoing tachycardia HR 130's to 140's, Lactic acid remains in 3's >> 4.1 11/3 persistent tachycardic and lactic acidosis, HR's do not improve with metoprolol, soft BP's  cardiology consulted.    11/4 severe acidosis and acute renal failure        Antimicrobials:   Antibiotics Given (last 72 hours)     Date/Time Action Medication Dose Rate   06/22/2023 2247 New Bag/Given   ceFEPIme (MAXIPIME) 2 g in sodium chloride 0.9 % 100 mL IVPB 2 g 200 mL/hr   06/22/2023 2357 New Bag/Given   metroNIDAZOLE (FLAGYL) IVPB 500 mg 500 mg 100 mL/hr   06/05/23 1023 New Bag/Given   ceFEPIme (MAXIPIME) 2 g in sodium chloride 0.9 % 100 mL IVPB 2 g 200 mL/hr   06/05/23 1113 New Bag/Given   metroNIDAZOLE (FLAGYL) IVPB 500 mg 500 mg 100 mL/hr   06/05/23 1747 New Bag/Given   vancomycin (VANCOREADY) IVPB 1250 mg/250 mL 1,250 mg 166.7 mL/hr   06/05/23 2150 New Bag/Given   ceFEPIme (MAXIPIME) 2 g in sodium chloride 0.9 % 100 mL IVPB 2 g 200 mL/hr   06/05/23 2153 New Bag/Given   metroNIDAZOLE (FLAGYL) IVPB 500 mg 500 mg 100 mL/hr   06/06/23 0924 New Bag/Given   ceFEPIme (MAXIPIME) 2 g in sodium chloride 0.9 % 100 mL IVPB 2 g 200 mL/hr   06/06/23 1059 New Bag/Given  metroNIDAZOLE (FLAGYL) IVPB 500 mg 500 mg 100 mL/hr   06/06/23 2143 New Bag/Given   metroNIDAZOLE (FLAGYL) IVPB 500 mg 500 mg 100 mL/hr   06/06/23 2146 New Bag/Given   ceFEPIme (MAXIPIME) 2 g in sodium chloride 0.9 % 100 mL IVPB 2 g 200 mL/hr   06/07/23 1043 New Bag/Given   ceFEPIme (MAXIPIME) 2 g in sodium chloride 0.9 % 100 mL IVPB 2 g 200 mL/hr   06/07/23 1152 New Bag/Given   metroNIDAZOLE (FLAGYL) IVPB 500 mg 500 mg 100 mL/hr            Interim History / Subjective:  Patient with severe acidosis and acute renal  failure with shock, due to sepsis from STAGE V CANCER     Objective   Blood pressure (!) 78/57, pulse (!) 126, temperature 98.1 F (36.7 C), temperature source Axillary, resp. rate (!) 30, height 5\' 11"  (1.803 m), weight 112.4 kg, SpO2 95%.        Intake/Output Summary (Last 24 hours) at 06/07/2023 1722 Last data filed at 06/07/2023 0600 Gross per 24 hour  Intake 2094.37 ml  Output 500 ml  Net 1594.37 ml   Filed Weights   06/07/2023 2233 06/07/23 0410  Weight: 105.7 kg 112.4 kg     REVIEW OF SYSTEMS MINIMAL- LETHARGIC  PHYSICAL EXAMINATION:  GENERAL:critically ill appearing, +resp distress EYES: Pupils equal, round, reactive to light.  No scleral icterus.  MOUTH: Moist mucosal membrane.  NECK: Supple.  PULMONARY: Lungs clear to auscultation, +rhonchi,  CARDIOVASCULAR: S1 and S2.  Regular rate and rhythm GASTROINTESTINAL: Soft, nontender, -distended. Positive bowel sounds.  MUSCULOSKELETAL: edema.  NEUROLOGIC: lethargic SKIN:normal, warm to touch, Capillary refill delayed  Pulses present bilaterally   Labs/imaging that I havepersonally reviewed  (right click and "Reselect all SmartList Selections" daily)      ASSESSMENT AND PLAN SYNOPSIS  77 yo male with severe septic shock due to acute live failure due to stage 4 cancer with acute renal failure  HIGH RISK FOR CARDIAC ARREST AND HIGH RISK FOR INTUBATION  SEPTIC shock SOURCE-LIVER FAILURE -use vasopressors to keep MAP>65 as needed -follow ABG and LA as needed -follow up cultures -emperic ABX -consider stress dose steroids -aggressive IV fluid Resuscitation      CARDIAC ICU monitoring   ACUTE KIDNEY INJURY/Renal Failure -continue Foley Catheter-assess need -Avoid nephrotoxic agents -Follow urine output, BMP -Ensure adequate renal perfusion, optimize oxygenation -Renal dose medications   Intake/Output Summary (Last 24 hours) at 06/07/2023 1722 Last data filed at 06/07/2023 0600 Gross per 24 hour   Intake 2094.37 ml  Output 500 ml  Net 1594.37 ml     NEUROLOGY Acute  metabolic encephalopathy Avoid sedatives   INFECTIOUS DISEASE -continue antibiotics as prescribed -follow up cultures   ENDO - ICU hypoglycemic\Hyperglycemia protocol -check FSBS per protocol   GI GI PROPHYLAXIS as indicated  NUTRITIONAL STATUS DIET-->as tolerated Constipation protocol as indicated   ELECTROLYTES -follow labs as needed -replace as needed -pharmacy consultation and following   ACUTE ANEMIA- TRANSFUSE AS NEEDED CONSIDER TRANSFUSION  IF HGB<7      Best practice (right click and "Reselect all SmartList Selections" daily)  Diet: NPO Mobility:  bed rest  Code Status:  FULL Disposition:ICU  Labs   CBC: Recent Labs  Lab 06/01/23 1139 06/19/2023 1520 06/19/2023 2102 06/06/23 0547 06/07/23 0557  WBC 17.2* 17.9* 17.8* 17.7* 16.3*  NEUTROABS 14.6* 15.1*  --   --   --   HGB 14.4 15.2 13.9 14.1 14.3  HCT 42.7  45.0 40.6 40.3 41.1  MCV 80.6 79.8* 80.6 78.3* 78.3*  PLT 420* 329 300 313 324    Basic Metabolic Panel: Recent Labs  Lab 06/05/23 0409 06/05/23 0842 06/05/23 2119 06/06/23 0547 06/07/23 0557  NA 130* 131* 131* 134* 131*  K 5.0 5.0 4.9 5.4* 5.2*  CL 96* 97* 99 99 97*  CO2 22 20* 21* 20* 19*  GLUCOSE 130* 111* 166* 141* 140*  BUN 54* 54* 57* 58* 67*  CREATININE 2.00* 1.87* 1.87* 1.91* 2.32*  CALCIUM 7.9* 8.0* 7.9* 8.2* 8.0*  MG  --   --  2.9*  --   --    GFR: Estimated Creatinine Clearance: 34.5 mL/min (A) (by C-G formula based on SCr of 2.32 mg/dL (H)). Recent Labs  Lab 06/21/2023 1520 06/27/2023 1716 06/26/2023 2102 06/05/23 0842 06/06/23 0547 06/06/23 1250 06/07/23 0557 06/07/23 1633  PROCALCITON  --   --  4.04  --  4.89  --   --   --   WBC 17.9*  --  17.8*  --  17.7*  --  16.3*  --   LATICACIDVEN 6.2*   < >  --    < > 4.1* 3.4* 3.3* >9.0*   < > = values in this interval not displayed.    Liver Function Tests: Recent Labs  Lab 06/01/23 1139  06/08/2023 1520 06/06/23 0547 06/07/23 0557  AST 293* 306* 420* 331*  ALT 125* 156* 171* 150*  ALKPHOS 820* 968* 846* 756*  BILITOT 5.2* 7.1* 7.0* 6.4*  PROT 6.4* 6.0* 5.4* 5.6*  ALBUMIN 2.4* 2.2* 2.0* 2.0*   No results for input(s): "LIPASE", "AMYLASE" in the last 168 hours. No results for input(s): "AMMONIA" in the last 168 hours.  ABG    Component Value Date/Time   PHART 7.39 06/07/2023 1603   PCO2ART 26 (L) 06/07/2023 1603   PO2ART 81 (L) 06/07/2023 1603   HCO3 15.7 (L) 06/07/2023 1603   ACIDBASEDEF 7.6 (H) 06/07/2023 1603   O2SAT 97.1 06/07/2023 1603     Coagulation Profile: Recent Labs  Lab 06/29/2023 1520 06/05/23 0409 06/06/23 1552  INR 1.5* 1.5* 1.7*    Cardiac Enzymes: No results for input(s): "CKTOTAL", "CKMB", "CKMBINDEX", "TROPONINI" in the last 168 hours.  HbA1C: Hgb A1c MFr Bld  Date/Time Value Ref Range Status  06/08/2023 03:20 PM 6.8 (H) 4.8 - 5.6 % Final    Comment:    (NOTE) Pre diabetes:          5.7%-6.4%  Diabetes:              >6.4%  Glycemic control for   <7.0% adults with diabetes   12/04/2021 02:32 AM 7.9 (H) 4.8 - 5.6 % Final    Comment:    (NOTE) Pre diabetes:          5.7%-6.4%  Diabetes:              >6.4%  Glycemic control for   <7.0% adults with diabetes     CBG: Recent Labs  Lab 06/06/23 2119 06/07/23 0753 06/07/23 1200 06/07/23 1316 06/07/23 1615  GLUCAP 165* 128* 100* 89 121*     Past Medical History:  He,  has a past medical history of Anemia, Arthritis, Coronary artery disease, Diabetes mellitus without complication (HCC), Diverticulosis, Duodenal ulcer, Erectile dysfunction, GERD (gastroesophageal reflux disease), Hyperlipidemia, and Hypertension.   Surgical History:   Past Surgical History:  Procedure Laterality Date   CARDIAC CATHETERIZATION     COLONOSCOPY WITH PROPOFOL N/A 05/31/2017  Procedure: COLONOSCOPY WITH PROPOFOL;  Surgeon: Scot Jun, MD;  Location: [facility] ENDOSCOPY;  Service:  Endoscopy;  Laterality: N/A;   JOINT REPLACEMENT Left    knee   THORACIC DISCECTOMY N/A 08/09/2019   Procedure: T11-12 POSTERIOR FUSION WITH TRANSPEDICULAR DECOMPRESSION;  Surgeon: Venetia Night, MD;  Location: [facility] ORS;  Service: Neurosurgery;  Laterality: N/A;   VASECTOMY       Social History:   reports that he quit smoking about 35 years ago. His smoking use included cigarettes. He has never used smokeless tobacco. He reports that he does not drink alcohol and does not use drugs.   Family History:  His family history includes Hypertension in his mother.   Allergies Allergies  Allergen Reactions   Actos [Pioglitazone] Hives   Amoxicillin Hives and Rash     Home Medications  Prior to Admission medications   Medication Sig Start Date End Date Taking? Authorizing Provider  amLODipine (NORVASC) 10 MG tablet Take 10 mg by mouth daily.   Yes [provider]  ascorbic acid (VITAMIN C) 1000 MG tablet Take 1,000 mg by mouth daily.   Yes [provider]  atorvastatin (LIPITOR) 20 MG tablet Take 20 mg by mouth daily.   Yes [provider]  cephALEXin (KEFLEX) 500 MG capsule Take 1 capsule (500 mg total) by mouth 3 (three) times daily for 7 days. 05/31/23 06/07/23 Yes Ray, Danie Binder, MD  cholecalciferol (VITAMIN D) 25 MCG (1000 UT) tablet Take 1,000 Units by mouth daily.   Yes [provider]  CINNAMON PO Take 1,000 mg by mouth daily.    Yes [provider]  glipiZIDE (GLUCOTROL) 5 MG tablet Take 5 mg by mouth daily. 07/17/19  Yes [provider]  hydrochlorothiazide (HYDRODIURIL) 25 MG tablet Take 25 mg by mouth daily.   Yes [provider]  magnesium oxide (MAG-OX) 400 MG tablet Take 400 mg by mouth daily.   Yes [provider]  meloxicam (MOBIC) 15 MG tablet Take 1 tablet (15 mg total) by mouth daily. Take with food. 04/20/23 04/19/24 Yes Drake Leach, PA-C  metFORMIN (GLUCOPHAGE) 1000 MG tablet Take 1,000 mg by mouth 2  (two) times daily with a meal.   Yes [provider]  omega-3 acid ethyl esters (LOVAZA) 1 g capsule Take 1 capsule by mouth 2 (two) times daily.   Yes [provider]  ramipril (ALTACE) 10 MG capsule Take 10 mg by mouth 2 (two) times daily.    Yes [provider]  Semaglutide, 2 MG/DOSE, 8 MG/3ML SOPN Inject 0.75 mLs into the skin once a week. (Tuesday) 08/24/22  Yes [provider]  traMADol (ULTRAM) 50 MG tablet Take 1 tablet (50 mg total) by mouth every 8 (eight) hours as needed. 06/01/23  Yes Michaelyn Barter, MD  carvedilol (COREG) 3.125 MG tablet Take 3.125 mg by mouth 2 (two) times daily. Patient not taking: Reported on 05/27/2023 10/28/21   [provider]  vitamin B-12 (CYANOCOBALAMIN) 1000 MCG tablet Take 1,000 mcg by mouth daily.    [provider]          Critical Care Time devoted to patient care services described in this note is 65 minutes.   Critical care was necessary to treat /prevent imminent and life-threatening deterioration.   PATIENT WITH VERY POOR PROGNOSIS I ANTICIPATE PROLONGED ICU LOS  Patient is critically ill. Patient with Multiorgan failure and at high risk for cardiac arrest and death.    Lucie Leather, M.D.  [name]  Medical Director [facility] Medical Director [facility] Cardio-Pulmonary Department

## 2023-06-07 NOTE — Progress Notes (Signed)
*  PRELIMINARY RESULTS* Echocardiogram 2D Echocardiogram has been performed.  Carolyne Fiscal 06/07/2023, 11:47 AM

## 2023-06-07 NOTE — Progress Notes (Signed)
[facility] CLINIC CARDIOLOGY PROGRESS NOTE   Patient ID: Jordan [name] MRN: [id number] DOB/AGE: 01/13/1946 77 y.o.  Admit date: 06/29/2023 Referring Physician Dr. Lindajo Royal hospitalist Primary Physician Dr. Maudie Flakes primary Primary Cardiologist Dr. Darrold Junker Reason for Consultation tachycardia atrial flutter  HPI: Jordan [name] is a 77 y.o. male with a past medical history of hypertension, hyperlipidemia, 60% stenosis mid LAD by cardiac catheterization (05/10/2008)  who presented to the ED on 06/13/2023 for tachycardia and hypotension. Found to be in atrial flutter, cardiology was consulted for further evaluation.   Interval History:  -Patient reports not feeling well today.  -HR remains in the 130-140s. Patient endorses feeling SOB, denies CP or palpitations.  -BP remains stable. On midodrine.   Review of systems complete and found to be negative unless listed above    Vitals:   06/07/23 0335 06/07/23 0410 06/07/23 0750 06/07/23 1158  BP: 102/79  108/72 101/68  Pulse: (!) 143  (!) 141 (!) 140  Resp: 19     Temp: (!) 97.3 F (36.3 C)  97.8 F (36.6 C) 98.1 F (36.7 C)  TempSrc:      SpO2: 94%  99% 97%  Weight:  112.4 kg    Height:         Intake/Output Summary (Last 24 hours) at 06/07/2023 1418 Last data filed at 06/07/2023 0600 Gross per 24 hour  Intake 2094.37 ml  Output 500 ml  Net 1594.37 ml     PHYSICAL EXAM General: Chronically ill appearing, well nourished, in no acute distress. HEENT: Normocephalic and atraumatic. Neck: No JVD.  Lungs: Normal respiratory effort on room air. Clear bilaterally to auscultation. No wheezes, crackles, rhonchi.  Heart: irregularly irregular, fast rate. Normal S1 and S2 without gallops or murmurs. Radial & DP pulses 2+ bilaterally. Abdomen: Non-distended appearing.  Msk: Normal strength and tone for age. Extremities: No clubbing, cyanosis or edema.   Neuro: Alert and oriented X 3. Psych: Mood appropriate, affect congruent.     LABS: Basic Metabolic Panel: Recent Labs    06/05/23 2119 06/06/23 0547 06/07/23 0557  NA 131* 134* 131*  K 4.9 5.4* 5.2*  CL 99 99 97*  CO2 21* 20* 19*  GLUCOSE 166* 141* 140*  BUN 57* 58* 67*  CREATININE 1.87* 1.91* 2.32*  CALCIUM 7.9* 8.2* 8.0*  MG 2.9*  --   --    Liver Function Tests: Recent Labs    06/06/23 0547 06/07/23 0557  AST 420* 331*  ALT 171* 150*  ALKPHOS 846* 756*  BILITOT 7.0* 6.4*  PROT 5.4* 5.6*  ALBUMIN 2.0* 2.0*   No results for input(s): "LIPASE", "AMYLASE" in the last 72 hours. CBC: Recent Labs    07/03/2023 1520 06/06/2023 2102 06/06/23 0547 06/07/23 0557  WBC 17.9*   < > 17.7* 16.3*  NEUTROABS 15.1*  --   --   --   HGB 15.2   < > 14.1 14.3  HCT 45.0   < > 40.3 41.1  MCV 79.8*   < > 78.3* 78.3*  PLT 329   < > 313 324   < > = values in this interval not displayed.   Cardiac Enzymes: Recent Labs    06/06/23 1250  TROPONINIHS 40*   BNP: Recent Labs    06/06/23 1250  BNP 235.1*   D-Dimer: Recent Labs    06/05/23 1616  DDIMER 13.52*   Hemoglobin A1C: Recent Labs    06/22/2023 1520  HGBA1C 6.8*   Fasting Lipid Panel: No results for input(s): "  CHOL", "HDL", "LDLCALC", "TRIG", "CHOLHDL", "LDLDIRECT" in the last 72 hours. Thyroid Function Tests: Recent Labs    06/05/23 0408  TSH 2.805   Anemia Panel: No results for input(s): "VITAMINB12", "FOLATE", "FERRITIN", "TIBC", "IRON", "RETICCTPCT" in the last 72 hours.  ECHOCARDIOGRAM COMPLETE  Result Date: 06/07/2023    ECHOCARDIOGRAM REPORT   Patient Name:   Jordan [name] Date of Exam: 06/07/2023 Medical Rec #:  [id number]   Height:       71.0 in Accession #:    [id number]  Weight:       247.8 lb Date of Birth:  [birth year]   BSA:          2.310 m Patient Age:    77 years    BP:           108/72 mmHg Patient Gender: M           HR:           139 bpm. Exam Location:  [facility] Procedure: 2D Echo, Cardiac Doppler and Color Doppler Indications:     Atrial Flutter  History:         Patient  has no prior history of Echocardiogram examinations.                  CAD, Abnormal ECG, Arrythmias:Atrial Flutter,                  Signs/Symptoms:Dizziness/Lightheadedness; Risk                  Factors:Hypertension, Diabetes and Dyslipidemia. CKD, CA with                  mets.  Sonographer:     Mikki Harbor Referring Phys:  [id number] KELLY A GRIFFITH Diagnosing Phys: Rozell Searing Custovic  Sonographer Comments: Technically difficult study due to poor echo windows, no apical window, suboptimal parasternal window and patient is obese. Image acquisition challenging due to respiratory motion. IMPRESSIONS  1. Left ventricular ejection fraction, by estimation, is 55 to 60%. Left ventricular ejection fraction by PLAX is 67 %. The left ventricle has normal function. The left ventricle has no regional wall motion abnormalities. Left ventricular diastolic parameters are indeterminate.  2. Right ventricular systolic function is normal. The right ventricular size is normal.  3. The mitral valve is grossly normal. Trivial mitral valve regurgitation.  4. The aortic valve is grossly normal. Aortic valve regurgitation is not visualized. FINDINGS  Left Ventricle: Left ventricular ejection fraction, by estimation, is 55 to 60%. Left ventricular ejection fraction by PLAX is 67 %. The left ventricle has normal function. The left ventricle has no regional wall motion abnormalities. The left ventricular internal cavity size was normal in size. There is no left ventricular hypertrophy. Left ventricular diastolic parameters are indeterminate. Right Ventricle: The right ventricular size is normal. No increase in right ventricular wall thickness. Right ventricular systolic function is normal. Left Atrium: Left atrial size was normal in size. Right Atrium: Right atrial size was normal in size. Pericardium: There is no evidence of pericardial effusion. Mitral Valve: The mitral valve is grossly normal. Trivial mitral valve regurgitation.  Tricuspid Valve: The tricuspid valve is grossly normal. Tricuspid valve regurgitation is trivial. Aortic Valve: The aortic valve is grossly normal. Aortic valve regurgitation is not visualized. Pulmonic Valve: The pulmonic valve was grossly normal. Pulmonic valve regurgitation is not visualized. Aorta: The aortic root was not well visualized. IAS/Shunts: No atrial level shunt detected by color flow Doppler.  LEFT VENTRICLE  PLAX 2D LV EF:         Left ventricular ejection fraction by PLAX is 67 %. LVIDd:         3.30 cm LVIDs:         2.10 cm LV PW:         1.80 cm LV IVS:        1.60 cm LVOT diam:     2.00 cm LVOT Area:     3.14 cm  LEFT ATRIUM         Index LA diam:    4.10 cm 1.78 cm/m                        PULMONIC VALVE AORTA                 PV Vmax:       0.71 m/s Ao Root diam: 4.00 cm PV Peak grad:  2.0 mmHg   SHUNTS Systemic Diam: 2.00 cm Rozell Searing Custovic Electronically signed by Clotilde Dieter Signature Date/Time: 06/07/2023/2:10:21 PM    Final    DG Chest Port 1 View  Result Date: 06/06/2023 CLINICAL DATA:  Dyspnea EXAM: PORTABLE CHEST 1 VIEW COMPARISON:  Chest x-ray dated June 04, 2023 FINDINGS: Cardiac and mediastinal contours are unchanged when accounting for differences in lung volumes. Mild right basilar opacity which is likely due to atelectasis. Lungs are otherwise clear. No large pleural effusion or pneumothorax. IMPRESSION: Mild right basilar opacity which is likely due to atelectasis. Electronically Signed   By: Allegra Lai M.D.   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 male with a past medical history of hypertension, hyperlipidemia, 60% stenosis mid LAD by cardiac catheterization (05/10/2008)  who presented to the ED on 06/27/2023 for tachycardia and hypotension. Found to be in atrial flutter, cardiology was consulted for further evaluation.   # Atrial flutter Patient with rates in the 140s on admission found to be in atrial flutter on EKG. No prior hx. Patient with SOB, no chest pain or palpitations. Has not responded to IV pushes/po AV blockers.  -Will start labetalol infusion and titrate for rate control.  -Continue IV heparin.   # Lactic acidosis # Sepsis # Recent UTI infection Patient presenting with tachycardia, hypotension. Concern for sepsis, recently treated for UTI. Lactic acid elevated on admission.  -Management per primary.  -Patient to go for HIDA scan today.   # Metastatic cancer Patient with metastatic cancer to liver, bone, and lymph nodes.  -Palliative consulted per primary.  This patient's case was discussed and created with Dr. Melton Alar and she is in agreement.  Signed:  Gale Journey, PA-C  06/07/2023, 2:18 PM [facility] Cardiology

## 2023-06-07 NOTE — Progress Notes (Signed)
Progress Note   Patient: Jordan [name] ZOX:[id number] DOB: [birth year] DOA: 06/05/2023     3 DOS: the patient was seen and examined on 06/07/2023   Brief hospital course: HPI on admission 06/29/2023:  " Jordan [name] is a 77 y.o. 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  Patient had an E. coli UTI diagnosed on 10/11 patient was seen in the ED on 10/28 for RUQ pain and workup with CT was about the same as prior findings.  He did received an NS bolus for hyponatremia of 128 and AKI of 1.89.  WBC 17,000 but he did not meet sepsis criteria.  He was started on Keflex for UTI (had admission for urosepsis March 2024) but he has only taken 1 dose due to poor oral intake and generally feeling unwell.  He has vague abdominal pain Mostly in the periumbilical and lower abdomen going from left to right.  Denies vomiting, diarrhea, constipation or dysuria.  His main complaint is generalized malaise. ..." See H&P for full HPI on admission and ED course.  Patient was admitted and started on empiric broad spectrum antibiotics.  RUQ U/S showed multiple liver mets, small volume ascites, gallbladder sludge, CBD diameter 5.2 mm, no sonographic Murphy sign, gallgbladder wall thickening (aslo seen previously).  MRCP negative on 10/31 obtained per Oncologist. Negative for choledocholithiasis.  Showed extensive liver metastatic disease, lymphadenopathy likely nodal mets, enhancement of vertebral bodies suspicious for osseous mets.  Cholelithiasis.   11/1 -- pt persistently tachycardic.  Lactic acid improving 6.2 >> 5.5 >> 3.2 11/2 -- ongoing tachycardia HR 130's to 140's, Lactic acid remains in 3's >> 4.1 11/3 - persistent tachycardic and lactic acidosis, HR's do not improve with  metoprolol, soft BP's  cardiology consulted - increased metoprolol and added midodrine. HR's unchanged.  Pending echo, HIDA tomorrow, VQ scan etc as below.   11/4 - HR's still 130's. Cardiology started labetalol drip, transferred to stepwdown for this.  PCCM consulted for pressors if needed as BP's very soft. Worsening renal function.  HIDA scan results pending.  Echo normal RV size and function, reassuring, normal EF.     Assessment and Plan: * Severe sepsis (HCC) Septic shock (due to Lactic acidosis) UTI ruled out  ?Intra-abdominal source related to biliary disease History of E. coli UTI 05/14/2023 Remains on Sepsis fluids for persistent lactic acidosis and tachycardia UTI ruled out - urine culture no growth --Continue Cefepime, Flagyl --D/C Vancomycin --Follow blood cultures --Pain control PRN --Trend lactic acid --Monitor fever curve, CBC, hemodynamics  Case discussed with on-call surgeon who reviewed RUQ [place] and MRCP.  Recommend HIDA scan if not improving on antibiotics, for consideration of perc biliary drain with IR if needed, but gallbladder contracted on U/S and no signs of cholecystitic on MRCP.  Felt gallbladder unlikely source of sepsis. --HIDA scan today - results pending --Resume diet post-scan  Lactic acidosis Multifactorial -- treating for presumed sepsis pending infectious evaluation as outlined.   Ddx also includes malignancy and liver disease, metformin use Lactate trend 6.2 >> 5.5 >> 3.2 >> 4.1 >> 4.1 (despite aggressive IV hydration) >> 3.4 >> 3.3 --Continue to trend lactate. --Continue IV fluids --Hold metformin --Follow cultures & continue antibiotics for now  Metastatic carcinoma involving liver, bone and  lymph nodes with unknown primary site [facility]) Elevated LFTs without ductal dilatation, likely related to metastatic disease -MRI 10/28: Mets to liver and bone and nodes with unknown primary - Ultrasound with CBD of 5.21 and no Murphy sign -Last oncology note  10/28 reviewed: "Worsening transaminitis is potentially related to worsening metastatic liver disease ...stage IV cancer which unfortunately cannot be cured. Goal of any treatment would be palliative"  -Patient is scheduled for PET scan on 11/4 to identify primary --Oncology following --PET scan inpatient on Wed 11/6 --Consulted Palliative Care  Acute renal failure superimposed on stage 3a chronic kidney disease (HCC) Anion gap metabolic acidosis Hyperkalemia - K 5.4 >> 5.2 Creatinine 1.97 on admission (from 1.3 few weeks prior) Likely prerenal in related to poor oral intake and sepsis 11/4 - Cr rising 2.32 - possibly due to urinary retention --Remains on IV fluids  --Monitor BMP --Avoid nephrotoxin & hypotension --Renally dose meds and monitor --Lokelma - repeat today --Foley for acute urinary retention  Essential hypertension Hypotension Patient hypotensive likely related to sepsis vs SIRS  --Hold ramipril, hydrochlorothiazide, carvedilol and amlodipine --Maintain MAP>65 --Continue midodrine --Consult PCCM for pressors if needed  Abnormal EKG A-flutter with RVR vs atrial ectopy Persistent tachycardia TSH normal --[facility] cardiology following --On IV heparin --HR not responding to metoprolol --Cardiology started labetalol drip --Transfer to stepdown --Echo normal EF --V/Q scan pending for tachycardia and elevated d-dimer - high risk for DVT/PE with malignancy --Telemetry  Acute urinary retention Pt required in/out cath this AM for bladder scan showing 492 cc and pt unable to void. --Place Foley given 2 in-out's required --Voiding trial when clinically appropriate  Type 2 diabetes mellitus (HCC) Sliding scale insulin coverage Patient is currently on Ozempic, metformin and glipizide which will be on hold  CAD (coronary artery disease) Stable, no chest pain --Hold atorvastatin with rising LFT's --Holding carvedilol, ramipril due to soft BP's and sepsis  Increased anion  gap metabolic acidosis Monitor BMP Due to sepsis, AKI  Hyperkalemia Recurrent. K 5.4 >> 5.2 Lokelma 10 g x 1 yesterday, repeat dose today Monitor BMP's  Hypotension Continue IV fluids per sepsis Maintain MAP > 65 Holding antihypertensives  Elevated LFTs Likely from metastatic disease  Cancer related pain Continue home tramadol  Hyperbilirubinemia .  Transaminitis .        Subjective: Pt awake resting in bed this AM, echo being done, wife at bedside.  Pt reports feeling worse today.  Feels very short of breath with any attempt to move with fast HR.  Denies CP.  Abdominal pain better today.     Physical Exam: Vitals:   06/07/23 0750 06/07/23 1158 06/07/23 1400 06/07/23 1500  BP: 108/72 101/68 96/72 (!) 78/57  Pulse: (!) 141 (!) 140 (!) 136 (!) 126  Resp:   (!) 21 (!) 30  Temp: 97.8 F (36.6 C) 98.1 F (36.7 C)    TempSrc:  Axillary    SpO2: 99% 97% 93% 95%  Weight:      Height:       General exam: awake, alert, no acute distress, obese, ill-appearing HEENT: moist mucus membranes, hearing grossly normal  Respiratory system: CTAB diminished bases, no wheezes, rales or rhonchi, normal respiratory effort. Cardiovascular system: normal S1/S2, tachycardic, regular rhythm, no peripheral edema.   Gastrointestinal system: distended, non-tender, +bowel sounds. Central nervous system: A&O x 3. no gross focal neurologic deficits, normal speech Extremities: cool extremities, moves all, no edema, normal tone Psychiatry: normal mood, congruent affect, judgement and insight appear normal  Data Reviewed:  Notable labs ---  Na 130 >> 131 >> 134 >> 131 K 4.9 >> 5.4 >> 5.2 Bicarb 21 >> 20 >> 19 Glucose 140 BUN 54 >> 54 >> 58 >> 67 Cr 2.00 >> 1.87 >> 1.91 >> 2.32 Ca 8.0 Lactic acid ...3.2 >> 4.1 >> 3.4 >> 3.3  BNP 235.1 LDH 1987 (prior 1025 on 05/14/23) Troponin 40  11/2 D--dimer 13.52  11/3 AM Chest xray -- mild right basilar opacity felt to be atelectasis,  otherwise no acute findings  Pending: HIDA scan VQ / pulm perfusion scan Echocardiogram   Family Communication: Wife at bedside on rounds this AM.   Disposition: Status is: Inpatient Remains inpatient appropriate because: severity of illness as outlined   Planned Discharge Destination: Home    Time spent: 60 minutes including time at bedside and in coordination of care with staff and consultants  Author: Pennie Banter, DO 06/07/2023 6:07 PM  For on call review [email or web address].

## 2023-06-07 NOTE — Progress Notes (Signed)
   06/07/23 1000  Spiritual Encounters  Type of Visit Initial  Care provided to: Pt and family  Referral source Patient request  Reason for visit Routine spiritual support  OnCall Visit Yes  Spiritual Framework  Presenting Themes Meaning/purpose/sources of inspiration;Goals in life/care;Values and beliefs;Courage hope and growth;Impactful experiences and emotions;Coping tools;Significant life change  Community/Connection Family;Friend(s);Significant other;Faith community  Patient Stress Factors Health changes  Family Stress Factors Health changes  Interventions  Spiritual Care Interventions Made Prayer;Encouragement;Established relationship of care and support;Compassionate presence;Reflective listening  Intervention Outcomes  Outcomes Connection to spiritual care;Awareness around self/spiritual resourses;Awareness of support;Reduced anxiety  Spiritual Care Plan  Spiritual Care Issues Still Outstanding Chaplain will continue to follow   Chaplain received a spiritual care consult for patient. Chaplain met with patient and his wife and patient shared that he feels like giving up because of the diagnosis he received about liver cancer. Chaplain offered the patient and his wife words of hope and encouragement. Chaplain also sang and prayed with them. Chaplain informed patient that he can reach out to chaplain services anytime for spiritual and emotional support.

## 2023-06-08 ENCOUNTER — Inpatient Hospital Stay: Payer: Medicare HMO

## 2023-06-08 DIAGNOSIS — A419 Sepsis, unspecified organism: Secondary | ICD-10-CM | POA: Diagnosis not present

## 2023-06-08 DIAGNOSIS — R6520 Severe sepsis without septic shock: Secondary | ICD-10-CM | POA: Diagnosis not present

## 2023-06-08 LAB — BASIC METABOLIC PANEL
Anion gap: 17 — ABNORMAL HIGH (ref 5–15)
BUN: 76 mg/dL — ABNORMAL HIGH (ref 8–23)
CO2: 18 mmol/L — ABNORMAL LOW (ref 22–32)
Calcium: 7.8 mg/dL — ABNORMAL LOW (ref 8.9–10.3)
Chloride: 99 mmol/L (ref 98–111)
Creatinine, Ser: 2.71 mg/dL — ABNORMAL HIGH (ref 0.61–1.24)
GFR, Estimated: 24 mL/min — ABNORMAL LOW (ref >60)
Glucose, Bld: 166 mg/dL — ABNORMAL HIGH (ref 70–99)
Potassium: 5.7 mmol/L — ABNORMAL HIGH (ref 3.5–5.1)
Sodium: 134 mmol/L — ABNORMAL LOW (ref 135–145)

## 2023-06-08 LAB — CBC
HCT: 37.5 % — ABNORMAL LOW (ref 39.0–52.0)
Hemoglobin: 13 g/dL (ref 13.0–17.0)
MCH: 27.9 pg (ref 26.0–34.0)
MCHC: 34.7 g/dL (ref 30.0–36.0)
MCV: 80.5 fL (ref 80.0–100.0)
Platelets: 327 10*3/uL (ref 150–400)
RBC: 4.66 MIL/uL (ref 4.22–5.81)
RDW: 22.2 % — ABNORMAL HIGH (ref 11.5–15.5)
WBC: 16 10*3/uL — ABNORMAL HIGH (ref 4.0–10.5)
nRBC: 0.1 % (ref 0.0–0.2)

## 2023-06-08 LAB — GLUCOSE, CAPILLARY
Glucose-Capillary: 182 mg/dL — ABNORMAL HIGH (ref 70–99)
Glucose-Capillary: 201 mg/dL — ABNORMAL HIGH (ref 70–99)
Glucose-Capillary: 190 mg/dL — ABNORMAL HIGH (ref 70–99)
Glucose-Capillary: 203 mg/dL — ABNORMAL HIGH (ref 70–99)

## 2023-06-08 LAB — HEPARIN LEVEL (UNFRACTIONATED): Heparin Unfractionated: 0.45 [IU]/mL (ref 0.30–0.70)

## 2023-06-08 LAB — PHOSPHORUS: Phosphorus: 7 mg/dL — ABNORMAL HIGH (ref 2.5–4.6)

## 2023-06-08 LAB — POTASSIUM: Potassium: 5 mmol/L (ref 3.5–5.1)

## 2023-06-08 LAB — LACTIC ACID, PLASMA: Lactic Acid, Venous: 4.2 mmol/L (ref 0.5–1.9)

## 2023-06-08 LAB — MAGNESIUM: Magnesium: 2.8 mg/dL — ABNORMAL HIGH (ref 1.7–2.4)

## 2023-06-08 LAB — APTT: aPTT: 157 s — ABNORMAL HIGH (ref 24–36)

## 2023-06-08 MED ORDER — HEPARIN (PORCINE) 25000 UT/250ML-% IV SOLN
1350.0000 [IU]/h | INTRAVENOUS | Status: DC
  Administered 2023-06-08 – 2023-06-09 (×2): 1350 [IU]/h via INTRAVENOUS
  Filled 2023-06-08: qty 250

## 2023-06-08 MED ORDER — SODIUM ZIRCONIUM CYCLOSILICATE 5 G PO PACK
10.0000 g | PACK | Freq: Three times a day (TID) | ORAL | Status: DC
  Administered 2023-06-08 – 2023-06-09 (×3): 10 g via ORAL
  Filled 2023-06-08 (×2): qty 2

## 2023-06-08 MED ORDER — DEXTROSE 50 % IV SOLN: 1.0000 | Freq: Once | INTRAVENOUS | Status: DC

## 2023-06-08 MED ORDER — HEPARIN (PORCINE) 25000 UT/250ML-% IV SOLN: 1050.0000 [IU]/h | INTRAVENOUS | Status: DC

## 2023-06-08 MED ORDER — INSULIN ASPART 100 UNIT/ML IV SOLN
10.0000 [IU] | Freq: Once | INTRAVENOUS | Status: AC
  Administered 2023-06-08: 10 [IU] via INTRAVENOUS
  Filled 2023-06-08: qty 0.1

## 2023-06-08 MED ORDER — CEFEPIME HCL 2 G IV SOLR
2.0000 g | INTRAVENOUS | Status: DC
  Administered 2023-06-08: 2 g via INTRAVENOUS
  Filled 2023-06-08: qty 12.5

## 2023-06-08 MED ORDER — DEXTROSE 50 % IV SOLN
1.0000 | Freq: Once | INTRAVENOUS | Status: AC
  Administered 2023-06-08: 50 mL via INTRAVENOUS
  Filled 2023-06-08: qty 50

## 2023-06-08 MED ORDER — SODIUM ZIRCONIUM CYCLOSILICATE 5 G PO PACK
10.0000 g | PACK | Freq: Once | ORAL | Status: AC
  Administered 2023-06-08: 10 g via ORAL
  Filled 2023-06-08: qty 2

## 2023-06-08 MED ORDER — ADULT MULTIVITAMIN W/MINERALS CH
1.0000 | ORAL_TABLET | Freq: Every day | ORAL | Status: DC
  Administered 2023-06-09: 1 via ORAL
  Filled 2023-06-08: qty 1

## 2023-06-08 MED ORDER — ENSURE ENLIVE PO LIQD
237.0000 mL | Freq: Three times a day (TID) | ORAL | Status: DC
  Administered 2023-06-08: 237 mL via ORAL

## 2023-06-08 MED ORDER — HYDROMORPHONE HCL 1 MG/ML IJ SOLN
1.0000 mg | INTRAMUSCULAR | Status: DC | PRN
  Administered 2023-06-08 – 2023-06-09 (×2): 1 mg via INTRAVENOUS
  Filled 2023-06-08 (×2): qty 1

## 2023-06-08 MED ORDER — INSULIN ASPART 100 UNIT/ML IV SOLN
10.0000 [IU] | Freq: Once | INTRAVENOUS | Status: DC
  Filled 2023-06-08: qty 0.1

## 2023-06-08 NOTE — Consult Note (Signed)
PHARMACY - ANTICOAGULATION CONSULT NOTE  Pharmacy Consult for heparin infusion Indication: atrial fibrillation  Allergies  Allergen Reactions   Actos [Pioglitazone] Hives   Amoxicillin Hives and Rash    Patient Measurements: Height: 5\' 11"  (180.3 cm) Weight: 112.4 kg (247 lb 12.8 oz) IBW/kg (Calculated) : 75.3 Heparin Dosing Weight: 97.6  Vital Signs: BP: 104/77 (11/05 0400) Pulse Rate: 130 (11/05 0345)  Labs: Recent Labs    06/06/23 0547 06/06/23 1250 06/06/23 1552 06/06/23 2344 06/07/23 0557 06/07/23 1632 06/07/23 1830 06/07/23 2254 06/08/23 0410  HGB 14.1  --   --   --  14.3  --   --   --  13.0  HCT 40.3  --   --   --  41.1  --   --   --  37.5*  PLT 313  --   --   --  324  --   --   --  327  APTT  --   --  37*  --   --   --   --   --  157*  LABPROT  --   --  19.9*  --   --   --   --   --   --   INR  --   --  1.7*  --   --   --   --   --   --   HEPARINUNFRC  --   --   --  0.40 0.50  --   --   --  0.45  CREATININE 1.91*  --   --   --  2.32*   < > 2.47* 2.67* 2.71*  TROPONINIHS  --  40*  --   --   --   --   --   --   --    < > = values in this interval not displayed.    Estimated Creatinine Clearance: 29.6 mL/min (A) (by C-G formula based on SCr of 2.71 mg/dL (H)).   Medical History: Past Medical History:  Diagnosis Date   Anemia    Arthritis    Coronary artery disease    Diabetes mellitus without complication (HCC)    Diverticulosis    Duodenal ulcer    Erectile dysfunction    GERD (gastroesophageal reflux disease)    Hyperlipidemia    Hypertension     Medications:  No home anticoagulants per pharmacist review.  Patient was on enoxaparin 52.5 mg SubQ every 24 hours with last dose 11/2 @ 2133  Assessment: 77 yo male with PMH including DM, CAD, HTN, CKD, and metastatic cancer involving the liver.  Patient admitted for treatment of sepsis.   Patient also found to have abnormal EKG readings found to be Aflutter.  Pharmacy consulted to initiate heparin  infusion.  Goal of Therapy:  Heparin level 0.3-0.7 units/ml Monitor platelets by anticoagulation protocol: Yes   11/03 2344 HL 0.40, therapeutic x 1 11/04 0557 HL 0.50, therapeutic x 2 11/05 0410 HL 0.45, therapeutic X 3   Plan:  Continue heparin infusion at 1350 units/hr Recheck HL w/ AM labs daily while therapeutic CBC daily while on heparin  Adlene Adduci D 06/08/2023 5:53 AM

## 2023-06-08 NOTE — IPAL (Signed)
  Interdisciplinary Goals of Care Family Meeting   Date carried out: 06/08/2023  Location of the meeting: Bedside  Member's involved: Physician, Bedside Registered Nurse, and Family Member or next of kin    GOALS OF CARE DISCUSSION  The Clinical status was relayed to family in detail- Wife and Sons  Updated and notified of patients medical condition- Patient remains unresponsive and will not open eyes to command.   Explained to family course of therapy and the modalities   Patient with Progressive multiorgan failure with a very high probablity of a very minimal chance of meaningful recovery despite all aggressive and optimal medical therapy.  PATIENT REMAINS DNR/DNI  Family understands the situation. Severe end stage cancer with multiple organs failing Patient is in the dying process Remains on pressors   Family are satisfied with Plan of action and management. All questions answered  Additional CC time 35 mins   Karel Turpen [name], M.D.  [name]  Medical Director [facility] Medical Director [facility] Cardio-Pulmonary Department

## 2023-06-08 NOTE — Progress Notes (Signed)
[facility] CLINIC CARDIOLOGY PROGRESS NOTE   Patient ID: Jordan [name] MRN: [id number] DOB/AGE: 08/19/1945 77 y.o.  Admit date: 07/03/2023 Referring Physician Dr. Lindajo Royal hospitalist Primary Physician Dr. Maudie Flakes primary Primary Cardiologist Dr. Darrold Junker Reason for Consultation tachycardia atrial flutter  HPI: Lue Jordan [name] is a 77 y.o. male with a past medical history of hypertension, hyperlipidemia, 60% stenosis mid LAD by cardiac catheterization (05/10/2008)  who presented to the ED on 06/12/2023 for tachycardia and hypotension. Found to be in atrial flutter, cardiology was consulted for further evaluation.   Interval History:  -Patient reports not feeling well, denies pain. -HR sustaining in the 120-130s. Labetalol gtt discontinued due to hypotension.  -Started on neosynephrine infusion yesterday.   Review of systems complete and found to be negative unless listed above    Vitals:   06/08/23 0700 06/08/23 0715 06/08/23 0730 06/08/23 0800  BP: 98/67 109/67 98/70   Pulse: (!) 127 (!) 131 (!) 132   Resp: 18 15 17    Temp:    98.5 F (36.9 C)  TempSrc:    Axillary  SpO2: 95% 96% 93%   Weight:      Height:         Intake/Output Summary (Last 24 hours) at 06/08/2023 0915 Last data filed at 06/08/2023 0700 Gross per 24 hour  Intake 2141.1 ml  Output 200 ml  Net 1941.1 ml     PHYSICAL EXAM General: Ill appearing, well nourished, in no acute distress laying nearly flat in hospital bed. HEENT: Normocephalic and atraumatic. Neck: No JVD.  Lungs: Normal respiratory effort on room air. Clear bilaterally to auscultation. No wheezes, crackles, rhonchi.  Heart: irregularly irregular, fast rate. Normal S1 and S2 without gallops or murmurs. Radial & DP pulses 2+ bilaterally. Abdomen: Non-distended appearing.  Msk: Normal strength and tone for age. Extremities: No clubbing, cyanosis or edema.   Neuro: Alert and oriented X 3. Psych: Mood appropriate, affect congruent.     LABS: Basic Metabolic Panel: Recent Labs    06/05/23 2119 06/06/23 0547 06/07/23 2254 06/08/23 0410  NA 131*   < > 133* 134*  K 4.9   < > 5.2* 5.7*  CL 99   < > 101 99  CO2 21*   < > 17* 18*  GLUCOSE 166*   < > 160* 166*  BUN 57*   < > 75* 76*  CREATININE 1.87*   < > 2.67* 2.71*  CALCIUM 7.9*   < > 7.4* 7.8*  MG 2.9*  --   --  2.8*  PHOS  --   --   --  7.0*   < > = values in this interval not displayed.   Liver Function Tests: Recent Labs    06/07/23 1830 06/07/23 2254  AST 272* 268*  ALT 124* 117*  ALKPHOS 646* 583*  BILITOT 5.4* 5.8*  PROT 4.8* 5.0*  ALBUMIN 1.6* 2.1*   No results for input(s): "LIPASE", "AMYLASE" in the last 72 hours. CBC: Recent Labs    06/07/23 0557 06/08/23 0410  WBC 16.3* 16.0*  HGB 14.3 13.0  HCT 41.1 37.5*  MCV 78.3* 80.5  PLT 324 327   Cardiac Enzymes: Recent Labs    06/06/23 1250  TROPONINIHS 40*   BNP: Recent Labs    06/06/23 1250  BNP 235.1*   D-Dimer: Recent Labs    06/05/23 1616  DDIMER 13.52*   Hemoglobin A1C: No results for input(s): "HGBA1C" in the last 72 hours.  Fasting Lipid Panel: No results for input(s): "  CHOL", "HDL", "LDLCALC", "TRIG", "CHOLHDL", "LDLDIRECT" in the last 72 hours. Thyroid Function Tests: No results for input(s): "TSH", "T4TOTAL", "T3FREE", "THYROIDAB" in the last 72 hours.  Invalid input(s): "FREET3"  Anemia Panel: No results for input(s): "VITAMINB12", "FOLATE", "FERRITIN", "TIBC", "IRON", "RETICCTPCT" in the last 72 hours.  NM Hepatobiliary Liver Func  Result Date: 06/07/2023 CLINICAL DATA:  Liver metastasis. Abdominal pain. Elevated bilirubin. EXAM: NUCLEAR MEDICINE HEPATOBILIARY IMAGING TECHNIQUE: Sequential images of the abdomen were obtained out to 60 minutes following intravenous administration of radiopharmaceutical. RADIOPHARMACEUTICALS:  7.1 mCi Tc-85m  Choletec IV COMPARISON:  MRI 06/03/2023, ultrasound 06/26/2023 FINDINGS: Heterogeneous uptake radiotracer in liver  consistent with multiple hepatic metastasis. Counts into the small bowel by 20 minutes. Gallbladder is not clearly identified. IV morphine was administered. No evidence of gallbladder filling. IMPRESSION: 1. The gallbladder is not confidently identified post morphine however, the gallbladder is severely contracted on comparison ultrasound and MRI. Recommend clinical correlation for acute cholecystitis. 2. Heterogeneous abnormal pattern of radiotracer accumulation in the liver consistent multifocal hepatic metastasis. 3. Patent common bile duct. Electronically Signed   By: Genevive Bi M.D.   On: 06/07/2023 19:53   ECHOCARDIOGRAM COMPLETE  Result Date: 06/07/2023    ECHOCARDIOGRAM REPORT   Patient Name:   Jordan [name] Date of Exam: 06/07/2023 Medical Rec #:  [id number]   Height:       71.0 in Accession #:    [id number]  Weight:       247.8 lb Date of Birth:  [birth year]   BSA:          2.310 m Patient Age:    77 years    BP:           108/72 mmHg Patient Gender: M           HR:           139 bpm. Exam Location:  [facility] Procedure: 2D Echo, Cardiac Doppler and Color Doppler Indications:     Atrial Flutter  History:         Patient has no prior history of Echocardiogram examinations.                  CAD, Abnormal ECG, Arrythmias:Atrial Flutter,                  Signs/Symptoms:Dizziness/Lightheadedness; Risk                  Factors:Hypertension, Diabetes and Dyslipidemia. CKD, CA with                  mets.  Sonographer:     Mikki Harbor Referring Phys:  [id number] KELLY A GRIFFITH Diagnosing Phys: Rozell Searing Custovic  Sonographer Comments: Technically difficult study due to poor echo windows, no apical window, suboptimal parasternal window and patient is obese. Image acquisition challenging due to respiratory motion. IMPRESSIONS  1. Left ventricular ejection fraction, by estimation, is 55 to 60%. Left ventricular ejection fraction by PLAX is 67 %. The left ventricle has normal function. The left ventricle has no  regional wall motion abnormalities. Left ventricular diastolic parameters are indeterminate.  2. Right ventricular systolic function is normal. The right ventricular size is normal.  3. The mitral valve is grossly normal. Trivial mitral valve regurgitation.  4. The aortic valve is grossly normal. Aortic valve regurgitation is not visualized. FINDINGS  Left Ventricle: Left ventricular ejection fraction, by estimation, is 55 to 60%. Left ventricular ejection fraction by PLAX is 67 %. The left ventricle has  normal function. The left ventricle has no regional wall motion abnormalities. The left ventricular internal cavity size was normal in size. There is no left ventricular hypertrophy. Left ventricular diastolic parameters are indeterminate. Right Ventricle: The right ventricular size is normal. No increase in right ventricular wall thickness. Right ventricular systolic function is normal. Left Atrium: Left atrial size was normal in size. Right Atrium: Right atrial size was normal in size. Pericardium: There is no evidence of pericardial effusion. Mitral Valve: The mitral valve is grossly normal. Trivial mitral valve regurgitation. Tricuspid Valve: The tricuspid valve is grossly normal. Tricuspid valve regurgitation is trivial. Aortic Valve: The aortic valve is grossly normal. Aortic valve regurgitation is not visualized. Pulmonic Valve: The pulmonic valve was grossly normal. Pulmonic valve regurgitation is not visualized. Aorta: The aortic root was not well visualized. IAS/Shunts: No atrial level shunt detected by color flow Doppler.  LEFT VENTRICLE PLAX 2D LV EF:         Left ventricular ejection fraction by PLAX is 67 %. LVIDd:         3.30 cm LVIDs:         2.10 cm LV PW:         1.80 cm LV IVS:        1.60 cm LVOT diam:     2.00 cm LVOT Area:     3.14 cm  LEFT ATRIUM         Index LA diam:    4.10 cm 1.78 cm/m                        PULMONIC VALVE AORTA                 PV Vmax:       0.71 m/s Ao Root diam: 4.00  cm PV Peak grad:  2.0 mmHg   SHUNTS Systemic Diam: 2.00 cm Rozell Searing Custovic Electronically signed by Clotilde Dieter Signature Date/Time: 06/07/2023/2:10:21 PM    Final    DG Chest Port 1 View  Result Date: 06/06/2023 CLINICAL DATA:  Dyspnea EXAM: PORTABLE CHEST 1 VIEW COMPARISON:  Chest x-ray dated June 04, 2023 FINDINGS: Cardiac and mediastinal contours are unchanged when accounting for differences in lung volumes. Mild right basilar opacity which is likely due to atelectasis. Lungs are otherwise clear. No large pleural effusion or pneumothorax. IMPRESSION: Mild right basilar opacity which is likely due to atelectasis. Electronically Signed   By: Allegra Lai M.D.   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 male with a past medical history of hypertension, hyperlipidemia, 60% stenosis mid LAD by cardiac  catheterization (05/10/2008)  who presented to the ED on 06/15/2023 for tachycardia and hypotension. Found to be in atrial flutter, cardiology was consulted for further evaluation.   # Atrial flutter Patient with rates in the 140s on admission found to be in atrial flutter on EKG. No prior  hx. Patient with SOB, no chest pain or palpitations. Has not responded to IV pushes/po AV blockers, BP could not tolerate labetalol gtt.  -Continue prn IV metoprolol for HR. Given underlying shock, metastatic cancer rate control will be difficult and current rates are likely as optimized as patient will be. Unable to tolerate more rate control medication due to hypotension.  -Continue IV heparin.   # Sepsic shock # Liver failure # Acute kidney injury Patient presenting with tachycardia, hypotension. Concern for sepsis, recently treated for UTI. Lactic acid elevated on admission. Cr on AM labs today 2.71, uptrending. -Neo synephrine initiated yesterday. -Management per primary.   # Metastatic cancer Patient with metastatic cancer to liver, bone, and lymph nodes.  -Palliative consulted per primary.  This patient's case was discussed and created with Dr. Melton Alar and she is in agreement.  Signed:  Gale Journey, PA-C  06/08/2023, 9:15 AM [facility]

## 2023-06-08 NOTE — Plan of Care (Signed)

## 2023-06-08 NOTE — Progress Notes (Signed)
NAME:  Jordan [name], MRN:  [id number], DOB:  [birth year], LOS: 4 ADMISSION DATE:  06/18/2023  CHIEF COMPLAINT:  low BP and weakness    History of Present Illness:  77 y.o. male with medical history significant for DM, CAD, HTN, CKD llla, with fairly recent diagnosis of metastatic CA involving liver with unknown primary s/p biopsy who went for port placement with IR  11/1 but was sent to the ED due to concerns for protracted weakness and concerns for sepsis.    Patient had an E. coli UTI diagnosed on 10/11 patient was seen in the ED on 10/28 for RUQ pain and workup with CT was about the same as prior findings.    He did received an NS bolus for hyponatremia of 128 and AKI of 1.89.  WBC 17,000 but he did not meet sepsis criteria.    He was started on Keflex for UTI (had admission for urosepsis March 2024) but he has only taken 1 dose due to poor oral intake and generally feeling unwell.  He has vague abdominal pain Mostly in the periumbilical and lower abdomen going from left to right.  Denies vomiting, diarrhea, constipation or dysuria.  His main complaint is generalized malaise.   Patient was admitted and started on empiric broad spectrum antibiotics.   RUQ U/S showed multiple liver mets, small volume ascites, gallbladder sludge, CBD diameter 5.2 mm, no sonographic Murphy sign, gallgbladder wall thickening (aslo seen previously).   MRCP negative on 10/31 obtained per Oncologist. Negative for choledocholithiasis.  Showed extensive liver metastatic disease, lymphadenopathy likely nodal mets, enhancement of vertebral bodies suspicious for osseous mets.  Cholelithiasis.  Status post liver biopsy on 05/27/2023 showed poorly differentiated adenocarcinoma with extensive necrosis. IHC positive for CK7, CK20, CDX2, negative for GATA3, PAX8, TTF-1, S100 and CD1 1 7. Differentials include GI origin, pancreaticobiliary system, head and neck region. Intrahepatic cholangiocarcinoma cannot be entirely  excluded.     Significant Hospital Events: Including procedures, antibiotic start and stop dates in addition to other pertinent events   11/1 pt persistently tachycardic.  Lactic acid improving 6.2 >> 5.5 >> 3.2 11/2 ongoing tachycardia HR 130's to 140's, Lactic acid remains in 3's >> 4.1 11/3 persistent tachycardic and lactic acidosis, HR's do not improve with metoprolol, soft BP's  cardiology consulted.    11/4 severe acidosis and acute renal failure 11/5 remains on pressors        Antimicrobials:   Antibiotics Given (last 72 hours)     Date/Time Action Medication Dose Rate   06/05/23 1023 New Bag/Given   ceFEPIme (MAXIPIME) 2 g in sodium chloride 0.9 % 100 mL IVPB 2 g 200 mL/hr   06/05/23 1113 New Bag/Given   metroNIDAZOLE (FLAGYL) IVPB 500 mg 500 mg 100 mL/hr   06/05/23 1747 New Bag/Given   vancomycin (VANCOREADY) IVPB 1250 mg/250 mL 1,250 mg 166.7 mL/hr   06/05/23 2150 New Bag/Given   ceFEPIme (MAXIPIME) 2 g in sodium chloride 0.9 % 100 mL IVPB 2 g 200 mL/hr   06/05/23 2153 New Bag/Given   metroNIDAZOLE (FLAGYL) IVPB 500 mg 500 mg 100 mL/hr   06/06/23 0924 New Bag/Given   ceFEPIme (MAXIPIME) 2 g in sodium chloride 0.9 % 100 mL IVPB 2 g 200 mL/hr   06/06/23 1059 New Bag/Given   metroNIDAZOLE (FLAGYL) IVPB 500 mg 500 mg 100 mL/hr   06/06/23 2143 New Bag/Given   metroNIDAZOLE (FLAGYL) IVPB 500 mg 500 mg 100 mL/hr   06/06/23 2146 New Bag/Given   ceFEPIme (  MAXIPIME) 2 g in sodium chloride 0.9 % 100 mL IVPB 2 g 200 mL/hr   06/07/23 1043 New Bag/Given   ceFEPIme (MAXIPIME) 2 g in sodium chloride 0.9 % 100 mL IVPB 2 g 200 mL/hr   06/07/23 1152 New Bag/Given   metroNIDAZOLE (FLAGYL) IVPB 500 mg 500 mg 100 mL/hr   06/07/23 2225 New Bag/Given   ceFEPIme (MAXIPIME) 2 g in sodium chloride 0.9 % 100 mL IVPB 2 g 200 mL/hr   06/07/23 2234 New Bag/Given   metroNIDAZOLE (FLAGYL) IVPB 500 mg 500 mg 100 mL/hr            Interim History / Subjective:  Patient with severe  acidosis and acute renal failure with shock, due to sepsis from STAGE IV CANCER Remains on pressors Remains critically ill Prognosis is grave Patient family discussion -now DNR/DNI  WE WILL NOT PROCEED WITH DIALYSIS AS REQUESTED BY PATIENT AND FAMILY LAST NIGHT    Objective   Blood pressure 104/77, pulse (!) 130, temperature 97.9 F (36.6 C), temperature source Axillary, resp. rate (!) 26, height 5\' 11"  (1.803 m), weight 112.4 kg, SpO2 94%.        Intake/Output Summary (Last 24 hours) at 06/08/2023 0724 Last data filed at 06/08/2023 0330 Gross per 24 hour  Intake --  Output 200 ml  Net -200 ml   Filed Weights   06/20/2023 2233 06/07/23 0410  Weight: 105.7 kg 112.4 kg      REVIEW OF SYSTEMS More alert and awake today Restless No pain  PHYSICAL EXAMINATION:  GENERAL:critically ill appearing EYES: Pupils equal, round, reactive to light.  No scleral icterus.  MOUTH: Moist mucosal membrane NECK: Supple.  PULMONARY: Lungs clear to auscultation, +rhonchi,  CARDIOVASCULAR: S1 and S2.  Regular rate and rhythm GASTROINTESTINAL: Soft, nontender, -distended. Positive bowel sounds.  MUSCULOSKELETAL: edema.  NEUROLOGIC: more alert and awake SKIN:normal, warm to touch, Capillary refill delayed  Pulses present bilaterally   Labs/imaging that I havepersonally reviewed  (right click and "Reselect all SmartList Selections" daily)      ASSESSMENT AND PLAN SYNOPSIS  77 yo male with severe septic shock due to acute live failure due to stage 4 cancer with acute renal failure  HIGH RISK FOR CARDIAC ARREST AND HIGH RISK FOR INTUBATION  PATIENT IS NOW DNR/DNI  SEPTIC shock SOURCE-LIVER FAILURE -use vasopressors to keep MAP>65 as needed -follow ABG and LA as needed -follow up cultures -emperic ABX -consider stress dose steroids -aggressive IV fluid Resuscitation     CARDIAC ICU monitoring  ACUTE KIDNEY INJURY/Renal Failure -continue Foley Catheter-assess need -Avoid  nephrotoxic agents -Follow urine output, BMP -Ensure adequate renal perfusion, optimize oxygenation -Renal dose medications   Intake/Output Summary (Last 24 hours) at 06/08/2023 0726 Last data filed at 06/08/2023 0330 Gross per 24 hour  Intake --  Output 200 ml  Net -200 ml    NEUROLOGY ACUTE METABOLIC ENCEPHALOPATHY Pain meds as needed    INFECTIOUS DISEASE -continue antibiotics as prescribed -follow up cultures   ACUTE ANEMIA- TRANSFUSE AS NEEDED CONSIDER TRANSFUSION  IF HGB<7   ENDO - ICU hypoglycemic\Hyperglycemia protocol -check FSBS per protocol   GI GI PROPHYLAXIS as indicated  NUTRITIONAL STATUS DIET-->TF's as tolerated Constipation protocol as indicated   ELECTROLYTES -follow labs as needed -replace as needed -pharmacy consultation and following      Best practice (right click and "Reselect all SmartList Selections" daily)  Diet: NPO Mobility:  bed rest  Code Status: DNR/DNI Disposition:ICU  Labs   CBC: Recent Labs  Lab 06/01/23 1139 06/29/2023 1520 06/24/2023 2102 06/06/23 0547 06/07/23 0557 06/08/23 0410  WBC 17.2* 17.9* 17.8* 17.7* 16.3* 16.0*  NEUTROABS 14.6* 15.1*  --   --   --   --   HGB 14.4 15.2 13.9 14.1 14.3 13.0  HCT 42.7 45.0 40.6 40.3 41.1 37.5*  MCV 80.6 79.8* 80.6 78.3* 78.3* 80.5  PLT 420* 329 300 313 324 327    Basic Metabolic Panel: Recent Labs  Lab 06/05/23 2119 06/06/23 0547 06/07/23 0557 06/07/23 1632 06/07/23 1830 06/07/23 2254 06/08/23 0410  NA 131*   < > 131* 129* 134* 133* 134*  K 4.9   < > 5.2* 4.5 5.4* 5.2* 5.7*  CL 99   < > 97* 104 102 101 99  CO2 21*   < > 19* 9* 18* 17* 18*  GLUCOSE 166*   < > 140* 66* 125* 160* 166*  BUN 57*   < > 67* 37* 70* 75* 76*  CREATININE 1.87*   < > 2.32* 1.03 2.47* 2.67* 2.71*  CALCIUM 7.9*   < > 8.0* 6.4* 7.6* 7.4* 7.8*  MG 2.9*  --   --   --   --   --  2.8*  PHOS  --   --   --   --   --   --  7.0*   < > = values in this interval not displayed.    GFR: Estimated Creatinine Clearance: 29.6 mL/min (A) (by C-G formula based on SCr of 2.71 mg/dL (H)). Recent Labs  Lab 06/27/2023 2102 06/05/23 0842 06/06/23 0547 06/06/23 1250 06/07/23 0557 06/07/23 1633 06/07/23 1941 06/07/23 2254 06/08/23 0410  PROCALCITON 4.04  --  4.89  --   --   --   --   --   --   WBC 17.8*  --  17.7*  --  16.3*  --   --   --  16.0*  LATICACIDVEN  --    < > 4.1*   < > 3.3* >9.0* 4.1* 4.2* 4.2*   < > = values in this interval not displayed.    Liver Function Tests: Recent Labs  Lab 06/06/23 0547 06/07/23 0557 06/07/23 1632 06/07/23 1830 06/07/23 2254  AST 420* 331* 117* 272* 268*  ALT 171* 150* 55* 124* 117*  ALKPHOS 846* 756* 275* 646* 583*  BILITOT 7.0* 6.4* 2.4* 5.4* 5.8*  PROT 5.4* 5.6* <3.0* 4.8* 5.0*  ALBUMIN 2.0* 2.0* <1.5* 1.6* 2.1*   No results for input(s): "LIPASE", "AMYLASE" in the last 168 hours. No results for input(s): "AMMONIA" in the last 168 hours.  ABG    Component Value Date/Time   PHART 7.37 06/07/2023 2238   PCO2ART 31 (L) 06/07/2023 2238   PO2ART 71 (L) 06/07/2023 2238   HCO3 17.9 (L) 06/07/2023 2238   ACIDBASEDEF 6.2 (H) 06/07/2023 2238   O2SAT 95.1 06/07/2023 2238     Coagulation Profile: Recent Labs  Lab 06/20/2023 1520 06/05/23 0409 06/06/23 1552  INR 1.5* 1.5* 1.7*    Cardiac Enzymes: No results for input(s): "CKTOTAL", "CKMB", "CKMBINDEX", "TROPONINI" in the last 168 hours.  HbA1C: Hgb A1c MFr Bld  Date/Time Value Ref Range Status  06/22/2023 03:20 PM 6.8 (H) 4.8 - 5.6 % Final    Comment:    (NOTE) Pre diabetes:          5.7%-6.4%  Diabetes:              >6.4%  Glycemic control for   <7.0% adults with diabetes  12/04/2021 02:32 AM 7.9 (H) 4.8 - 5.6 % Final    Comment:    (NOTE) Pre diabetes:          5.7%-6.4%  Diabetes:              >6.4%  Glycemic control for   <7.0% adults with diabetes     CBG: Recent Labs  Lab 06/07/23 0753 06/07/23 1200 06/07/23 1316 06/07/23 1615  06/07/23 2146  GLUCAP 128* 100* 89 121* 142*     Past Medical History:  He,  has a past medical history of Anemia, Arthritis, Coronary artery disease, Diabetes mellitus without complication (HCC), Diverticulosis, Duodenal ulcer, Erectile dysfunction, GERD (gastroesophageal reflux disease), Hyperlipidemia, and Hypertension.   Surgical History:   Past Surgical History:  Procedure Laterality Date   CARDIAC CATHETERIZATION     COLONOSCOPY WITH PROPOFOL N/A 05/31/2017   Procedure: COLONOSCOPY WITH PROPOFOL;  Surgeon: Scot Jun, MD;  Location: [facility] ENDOSCOPY;  Service: Endoscopy;  Laterality: N/A;   JOINT REPLACEMENT Left    knee   THORACIC DISCECTOMY N/A 08/09/2019   Procedure: T11-12 POSTERIOR FUSION WITH TRANSPEDICULAR DECOMPRESSION;  Surgeon: Venetia Night, MD;  Location: [facility] ORS;  Service: Neurosurgery;  Laterality: N/A;   VASECTOMY       Social History:   reports that he quit smoking about 35 years ago. His smoking use included cigarettes. He has never used smokeless tobacco. He reports that he does not drink alcohol and does not use drugs.   Family History:  His family history includes Hypertension in his mother.   Allergies Allergies  Allergen Reactions   Actos [Pioglitazone] Hives   Amoxicillin Hives and Rash     Home Medications  Prior to Admission medications   Medication Sig Start Date End Date Taking? Authorizing Provider  amLODipine (NORVASC) 10 MG tablet Take 10 mg by mouth daily.   Yes [provider]  ascorbic acid (VITAMIN C) 1000 MG tablet Take 1,000 mg by mouth daily.   Yes [provider]  atorvastatin (LIPITOR) 20 MG tablet Take 20 mg by mouth daily.   Yes [provider]  cephALEXin (KEFLEX) 500 MG capsule Take 1 capsule (500 mg total) by mouth 3 (three) times daily for 7 days. 05/31/23 06/07/23 Yes Ray, Danie Binder, MD  cholecalciferol (VITAMIN D) 25 MCG (1000 UT) tablet Take 1,000 Units by mouth daily.   Yes [provider]  CINNAMON PO Take 1,000 mg by mouth daily.    Yes [provider]  glipiZIDE (GLUCOTROL) 5 MG tablet Take 5 mg by mouth daily. 07/17/19  Yes [provider]  hydrochlorothiazide (HYDRODIURIL) 25 MG tablet Take 25 mg by mouth daily.   Yes [provider]  magnesium oxide (MAG-OX) 400 MG tablet Take 400 mg by mouth daily.   Yes [provider]  meloxicam (MOBIC) 15 MG tablet Take 1 tablet (15 mg total) by mouth daily. Take with food. 04/20/23 04/19/24 Yes Drake Leach, PA-C  metFORMIN (GLUCOPHAGE) 1000 MG tablet Take 1,000 mg by mouth 2 (two) times daily with a meal.   Yes [provider]  omega-3 acid ethyl esters (LOVAZA) 1 g capsule Take 1 capsule by mouth 2 (two) times daily.   Yes [provider]  ramipril (ALTACE) 10 MG capsule Take 10 mg by mouth 2 (two) times daily.    Yes [provider]  Semaglutide, 2 MG/DOSE, 8 MG/3ML SOPN Inject 0.75 mLs into the skin once a week. (Tuesday) 08/24/22  Yes [provider]  traMADol (ULTRAM) 50 MG tablet Take 1 tablet (50 mg total) by mouth every 8 (eight) hours as needed. 06/01/23  Yes Michaelyn Barter, MD  carvedilol (COREG) 3.125 MG tablet Take 3.125 mg by mouth 2 (two) times daily. Patient not taking: Reported on 05/27/2023 10/28/21   [provider]  vitamin B-12 (CYANOCOBALAMIN) 1000 MCG tablet Take 1,000 mcg by mouth daily.    [provider]          DVT/GI PRX  assessed I Assessed the need for Labs I Assessed the need for Foley I Assessed the need for Central Venous Line Family Discussion when available I Assessed the need for Mobilization I made an Assessment of medications to be adjusted accordingly Safety Risk assessment completed  CASE DISCUSSED IN MULTIDISCIPLINARY ROUNDS WITH ICU TEAM     Critical Care Time devoted to patient care services described in this note is 55 minutes.  Critical care was necessary to treat /prevent  imminent and life-threatening deterioration. Overall, patient is critically ill, prognosis is guarded.  Patient with Multiorgan failure and at high risk for cardiac arrest and death.    Lucie Leather, M.D.  [name]  Medical Director [facility] Medical Director [facility] Cardio-Pulmonary Department

## 2023-06-08 NOTE — Progress Notes (Signed)
Pharmacy Antibiotic Note  Jordan [name] is a 77 y.o. male admitted on 06/30/2023 with  possible intra-abdominal infection . Pharmacy has been consulted for Cefepime dosing.  Today, 06/08/2023 Day 5 of antibiotics WBC slowly down trending; 16.0 today  Afebrile  Scr 2.71 today with estimated CrCl 29.8 mL/min   Plan: Decrease cefepime dose to 2 gm IV Q24H based on current renal function  Patient also on metronidazole 500 mg IV Q12H  Pharmacy will continue to monitor and dose adjust appropriately   Height: 5\' 11"  (180.3 cm) Weight: 114 kg (251 lb 5.2 oz) IBW/kg (Calculated) : 75.3  Temp (24hrs), Avg:97.9 F (36.6 C), Min:97.8 F (36.6 C), Max:98.1 F (36.7 C)  Recent Labs  Lab 06/07/2023 1520 06/24/2023 1716 06/08/2023 2102 06/05/23 0049 06/06/23 0547 06/06/23 1250 06/07/23 0557 06/07/23 1632 06/07/23 1633 06/07/23 1830 06/07/23 1941 06/07/23 2254 06/08/23 0410  WBC 17.9*  --  17.8*  --  17.7*  --  16.3*  --   --   --   --   --  16.0*  CREATININE 1.89*  --  1.97*   < > 1.91*  --  2.32* 1.03  --  2.47*  --  2.67* 2.71*  LATICACIDVEN 6.2*   < >  --    < > 4.1*   < > 3.3*  --  >9.0*  --  4.1* 4.2* 4.2*   < > = values in this interval not displayed.    Estimated Creatinine Clearance: 29.8 mL/min (A) (by C-G formula based on SCr of 2.71 mg/dL (H)).    Allergies  Allergen Reactions   Actos [Pioglitazone] Hives   Amoxicillin Hives and Rash   Antimicrobials this admission: Vancomycin 11/1 >> 11/2 Cefepime 11/1 >> (11/8) Metronidazole 11/1 >> (11/8)   Microbiology results: 11/4 MRSA nares: neg  Thank you for allowing pharmacy to be a part of this patient's care.  Littie Deeds, PharmD Pharmacy Resident  06/08/2023 8:18 AM

## 2023-06-08 NOTE — Progress Notes (Signed)
Initial Nutrition Assessment  DOCUMENTATION CODES:   Obesity unspecified  INTERVENTION:   Ensure Enlive po TID, each supplement provides 350 kcal and 20 grams of protein.   Magic cup TID with meals, each supplement provides 290 kcal and 9 grams of protein  MVI po daily  Pt at high refeed risk; recommend monitor potassium, magnesium and phosphorus labs daily until stable  Daily weights   NUTRITION DIAGNOSIS:   Inadequate oral intake related to acute illness as evidenced by meal completion < 25%.  GOAL:   Patient will meet greater than or equal to 90% of their needs  MONITOR:   PO intake, Supplement acceptance, Labs, Weight trends, Skin, I & O's  REASON FOR ASSESSMENT:   Malnutrition Screening Tool    ASSESSMENT:   77 y/o male with h/o CKD III, HTN, metastatic cancer, CAD, DM, HLD, PUD and GERD who is admitted with AKI, sepsis, shock and UTI.  Met with pt in room today. Pt reports poor appetite and oral intake for several days. Pt reports that he is not feeling hungry today and he reports that he is too weak to eat anyway. Pt reports that he is willing to drink vanilla Ensure. Per RN, pt did drink some Ensure today but was too lethargic to eat anything. RD will add supplements and MVI to help pt meet his estimated needs. Pt is at high refeed risk. Palliative care is following for GOC.   Per chart, pt appears fairly weight stable pta.   Medications reviewed and include: solu-cortef, insulin, MVI, miralax, senokot, lokelma, cefepime, heparin, metronidazole, neo-synephrine, Na bicarbonate  Labs reviewed: Na 134(L), K 5.7(H), BUN 76(H), creat 2.71(H), P 7.0(H), Mg 2.8(H) Wbc- 16.0(H) Cbgs- 190, 201, 182 x 24 hrs  AIC 6.8(H)- 11/1  NUTRITION - FOCUSED PHYSICAL EXAM:  Flowsheet Row Most Recent Value  Orbital Region No depletion  Upper Arm Region No depletion  Thoracic and Lumbar Region No depletion  Buccal Region No depletion  Temple Region No depletion  Clavicle  Bone Region Mild depletion  Clavicle and Acromion Bone Region Mild depletion  Scapular Bone Region No depletion  Dorsal Hand No depletion  Patellar Region No depletion  Anterior Thigh Region No depletion  Posterior Calf Region No depletion  Edema (RD Assessment) None  Hair Reviewed  Eyes Reviewed  Mouth Reviewed  Skin Reviewed  Nails Reviewed   Diet Order:   Diet Order             Diet regular Room service appropriate? Yes; Fluid consistency: Thin  Diet effective now                  EDUCATION NEEDS:   Education needs have been addressed  Skin:  Skin Assessment: Reviewed RN Assessment  Last BM:  constipation  Height:   Ht Readings from Last 1 Encounters:  06/09/2023 5\' 11"  (1.803 m)    Weight:   Wt Readings from Last 1 Encounters:  06/08/23 114 kg    Ideal Body Weight:  78 kg  BMI:  Body mass index is 35.05 kg/m.  Estimated Nutritional Needs:   Kcal:  2300-2600kcal/day  Protein:  115-130g/day  Fluid:  2.0-2.3L/day  Betsey Holiday MS, RD, LDN Please refer to [facility] for RD and/or RD on-call/weekend/after hours pager

## 2023-06-09 ENCOUNTER — Inpatient Hospital Stay: Payer: Medicare HMO | Admitting: Internal Medicine

## 2023-06-09 ENCOUNTER — Inpatient Hospital Stay: Payer: Medicare HMO

## 2023-06-09 ENCOUNTER — Inpatient Hospital Stay: Payer: Medicare HMO | Admitting: Hospice and Palliative Medicine

## 2023-06-09 ENCOUNTER — Other Ambulatory Visit: Payer: Medicare HMO

## 2023-06-09 DIAGNOSIS — Z515 Encounter for palliative care: Secondary | ICD-10-CM | POA: Diagnosis not present

## 2023-06-09 DIAGNOSIS — C787 Secondary malignant neoplasm of liver and intrahepatic bile duct: Secondary | ICD-10-CM

## 2023-06-09 DIAGNOSIS — A419 Sepsis, unspecified organism: Secondary | ICD-10-CM | POA: Diagnosis not present

## 2023-06-09 DIAGNOSIS — C801 Malignant (primary) neoplasm, unspecified: Secondary | ICD-10-CM

## 2023-06-09 DIAGNOSIS — I499 Cardiac arrhythmia, unspecified: Secondary | ICD-10-CM | POA: Diagnosis not present

## 2023-06-09 DIAGNOSIS — E872 Acidosis, unspecified: Secondary | ICD-10-CM | POA: Diagnosis not present

## 2023-06-09 DIAGNOSIS — R6520 Severe sepsis without septic shock: Secondary | ICD-10-CM | POA: Diagnosis not present

## 2023-06-09 LAB — RENAL FUNCTION PANEL
Albumin: 1.9 g/dL — ABNORMAL LOW (ref 3.5–5.0)
Anion gap: 21 — ABNORMAL HIGH (ref 5–15)
BUN: 94 mg/dL — ABNORMAL HIGH (ref 8–23)
CO2: 18 mmol/L — ABNORMAL LOW (ref 22–32)
Calcium: 7.8 mg/dL — ABNORMAL LOW (ref 8.9–10.3)
Chloride: 96 mmol/L — ABNORMAL LOW (ref 98–111)
Creatinine, Ser: 3.7 mg/dL — ABNORMAL HIGH (ref 0.61–1.24)
GFR, Estimated: 16 mL/min — ABNORMAL LOW (ref >60)
Glucose, Bld: 201 mg/dL — ABNORMAL HIGH (ref 70–99)
Phosphorus: 8.3 mg/dL — ABNORMAL HIGH (ref 2.5–4.6)
Potassium: 5.3 mmol/L — ABNORMAL HIGH (ref 3.5–5.1)
Sodium: 135 mmol/L (ref 135–145)

## 2023-06-09 LAB — GLUCOSE, CAPILLARY
Glucose-Capillary: 181 mg/dL — ABNORMAL HIGH (ref 70–99)
Glucose-Capillary: 197 mg/dL — ABNORMAL HIGH (ref 70–99)

## 2023-06-09 LAB — CULTURE, BLOOD (ROUTINE X 2)
Culture: NO GROWTH
Culture: NO GROWTH
Special Requests: ADEQUATE

## 2023-06-09 LAB — CBC
HCT: 37.7 % — ABNORMAL LOW (ref 39.0–52.0)
Hemoglobin: 12.9 g/dL — ABNORMAL LOW (ref 13.0–17.0)
MCH: 27 pg (ref 26.0–34.0)
MCHC: 34.2 g/dL (ref 30.0–36.0)
MCV: 79 fL — ABNORMAL LOW (ref 80.0–100.0)
Platelets: 357 10*3/uL (ref 150–400)
RBC: 4.77 MIL/uL (ref 4.22–5.81)
RDW: 22.8 % — ABNORMAL HIGH (ref 11.5–15.5)
WBC: 17.8 10*3/uL — ABNORMAL HIGH (ref 4.0–10.5)
nRBC: 0.3 % — ABNORMAL HIGH (ref 0.0–0.2)

## 2023-06-09 LAB — HEPARIN LEVEL (UNFRACTIONATED): Heparin Unfractionated: 0.48 [IU]/mL (ref 0.30–0.70)

## 2023-06-09 LAB — MAGNESIUM: Magnesium: 2.9 mg/dL — ABNORMAL HIGH (ref 1.7–2.4)

## 2023-06-09 MED ORDER — ACETAMINOPHEN 325 MG PO TABS: 650.0000 mg | ORAL_TABLET | Freq: Four times a day (QID) | ORAL | Status: DC | PRN

## 2023-06-09 MED ORDER — ONDANSETRON HCL 4 MG/2ML IJ SOLN: 4.0000 mg | Freq: Four times a day (QID) | INTRAMUSCULAR | Status: DC | PRN

## 2023-06-09 MED ORDER — POLYVINYL ALCOHOL 1.4 % OP SOLN: 1.0000 [drp] | Freq: Four times a day (QID) | OPHTHALMIC | Status: DC | PRN

## 2023-06-09 MED ORDER — GLYCOPYRROLATE 1 MG PO TABS: 1.0000 mg | ORAL_TABLET | ORAL | Status: DC | PRN

## 2023-06-09 MED ORDER — LORAZEPAM 1 MG PO TABS: 1.0000 mg | ORAL_TABLET | ORAL | Status: DC | PRN

## 2023-06-09 MED ORDER — LORAZEPAM 2 MG/ML IJ SOLN: 1.0000 mg | INTRAMUSCULAR | Status: DC | PRN

## 2023-06-09 MED ORDER — HYDROMORPHONE HCL 1 MG/ML IJ SOLN
INTRAMUSCULAR | Status: AC
  Administered 2023-06-09: 1 mg via INTRAVENOUS
  Filled 2023-06-09: qty 1

## 2023-06-09 MED ORDER — ACETAMINOPHEN 650 MG RE SUPP: 650.0000 mg | Freq: Four times a day (QID) | RECTAL | Status: DC | PRN

## 2023-06-09 MED ORDER — GLYCOPYRROLATE 0.2 MG/ML IJ SOLN: 0.2000 mg | INTRAMUSCULAR | Status: DC | PRN

## 2023-06-09 MED ORDER — ONDANSETRON 4 MG PO TBDP: 4.0000 mg | ORAL_TABLET | Freq: Four times a day (QID) | ORAL | Status: DC | PRN

## 2023-06-09 MED ORDER — HALOPERIDOL LACTATE 5 MG/ML IJ SOLN
0.5000 mg | INTRAMUSCULAR | Status: DC | PRN
  Administered 2023-06-09: 0.5 mg via INTRAVENOUS
  Filled 2023-06-09: qty 1

## 2023-06-09 MED ORDER — HYDROMORPHONE HCL 1 MG/ML IJ SOLN: 1.0000 mg | INTRAMUSCULAR | Status: DC | PRN

## 2023-06-09 MED ORDER — HALOPERIDOL LACTATE 2 MG/ML PO CONC: 0.5000 mg | ORAL | Status: DC | PRN

## 2023-06-09 MED ORDER — HYDROMORPHONE HCL-NACL 50-0.9 MG/50ML-% IV SOLN
1.0000 mg/h | INTRAVENOUS | Status: DC
  Filled 2023-06-09: qty 50

## 2023-06-09 MED ORDER — HYDROMORPHONE BOLUS VIA INFUSION: 1.0000 mg | INTRAVENOUS | Status: DC | PRN

## 2023-06-09 MED ORDER — HYDROMORPHONE HCL 1 MG/ML IJ SOLN
1.0000 mg | INTRAMUSCULAR | Status: DC | PRN
  Administered 2023-06-09 (×2): 1 mg via INTRAVENOUS
  Filled 2023-06-09 (×2): qty 1

## 2023-06-09 MED ORDER — LORAZEPAM 2 MG/ML PO CONC: 1.0000 mg | ORAL | Status: DC | PRN

## 2023-06-09 MED ORDER — HALOPERIDOL 0.5 MG PO TABS: 0.5000 mg | ORAL_TABLET | ORAL | Status: DC | PRN

## 2023-06-09 MED ORDER — MORPHINE 100MG IN NS 100ML (1MG/ML) PREMIX INFUSION
1.0000 mg/h | INTRAVENOUS | Status: DC
Start: 2023-06-09 — End: 2023-06-10
  Administered 2023-06-09: 5 mg/h via INTRAVENOUS
  Filled 2023-06-09: qty 100

## 2023-06-09 MED ORDER — BIOTENE DRY MOUTH MT LIQD: 15.0000 mL | OROMUCOSAL | Status: DC | PRN

## 2023-06-09 NOTE — Consult Note (Signed)
PHARMACY - ANTICOAGULATION CONSULT NOTE  Pharmacy Consult for heparin infusion Indication: atrial fibrillation  Allergies  Allergen Reactions   Actos [Pioglitazone] Hives   Amoxicillin Hives and Rash    Patient Measurements: Height: 5\' 11"  (180.3 cm) Weight: 122.1 kg (269 lb 2.9 oz) IBW/kg (Calculated) : 75.3 Heparin Dosing Weight: 97.6  Vital Signs: Temp: 98.1 F (36.7 C) (11/06 0200) Temp Source: Axillary (11/06 0200) BP: 94/75 (11/06 0700) Pulse Rate: 134 (11/06 0700)  Labs: Recent Labs    06/06/23 1250 06/06/23 1552 06/06/23 2344 06/07/23 0557 06/07/23 1632 06/07/23 1830 06/07/23 2254 06/08/23 0410 06/09/23 0649  HGB  --   --    < > 14.3  --   --   --  13.0 12.9*  HCT  --   --   --  41.1  --   --   --  37.5* 37.7*  PLT  --   --   --  324  --   --   --  327 357  APTT  --  37*  --   --   --   --   --  157*  --   LABPROT  --  19.9*  --   --   --   --   --   --   --   INR  --  1.7*  --   --   --   --   --   --   --   HEPARINUNFRC  --   --    < > 0.50  --   --   --  0.45 0.48  CREATININE  --   --   --  2.32*   < > 2.47* 2.67* 2.71*  --   TROPONINIHS 40*  --   --   --   --   --   --   --   --    < > = values in this interval not displayed.    Estimated Creatinine Clearance: 30.8 mL/min (A) (by C-G formula based on SCr of 2.71 mg/dL (H)).   Medical History: Past Medical History:  Diagnosis Date   Anemia    Arthritis    Coronary artery disease    Diabetes mellitus without complication (HCC)    Diverticulosis    Duodenal ulcer    Erectile dysfunction    GERD (gastroesophageal reflux disease)    Hyperlipidemia    Hypertension     Medications:  No home anticoagulants per pharmacist review.  Patient was on enoxaparin 52.5 mg SubQ every 24 hours with last dose 11/2 @ 2133  Assessment: 77 yo male with PMH including DM, CAD, HTN, CKD, and metastatic cancer involving the liver.  Patient admitted for treatment of sepsis.   Patient also found to have abnormal  EKG readings found to be Aflutter.  Pharmacy consulted to initiate heparin infusion.  Goal of Therapy:  Heparin level 0.3-0.7 units/ml Monitor platelets by anticoagulation protocol: Yes   11/03 2344 HL 0.40, therapeutic x 1 11/04 0557 HL 0.50, therapeutic x 2 11/05 0410 HL 0.45, therapeutic X 3  11/06 0649 HL 0.48, therapeutic x 4  Plan:  Continue heparin infusion at 1350 units/hr Recheck HL w/ AM labs daily while therapeutic CBC daily while on heparin  Littie Deeds, PharmD Pharmacy Resident  06/09/2023 7:43 AM

## 2023-06-09 NOTE — Plan of Care (Signed)
  Problem: Coping: Goal: Ability to adjust to condition or change in health will improve Outcome: Progressing  Comfort care measures in place.

## 2023-06-09 NOTE — Progress Notes (Signed)
[facility] CLINIC CARDIOLOGY PROGRESS NOTE   Patient ID: Jordan [name] MRN: [id number] DOB/AGE: 11/15/1945 77 y.o.  Admit date: 07/02/2023 Referring Physician Dr. Lindajo Royal hospitalist Primary Physician Dr. Maudie Flakes primary Primary Cardiologist Dr. Darrold Junker Reason for Consultation tachycardia atrial flutter  HPI: Jordan [name] is a 77 y.o. male with a past medical history of hypertension, hyperlipidemia, 60% stenosis mid LAD by cardiac catheterization (05/10/2008)  who presented to the ED on 06/18/2023 for tachycardia and hypotension. Found to be in atrial flutter, cardiology was consulted for further evaluation.   Interval History:  -Patient resting in bed, denies any pain.  -HR remains in the 130s, he denies any palpitations or chest pain.  -Cr continues to rise. Remains hypotensive on neo synephrine.   Review of systems complete and found to be negative unless listed above    Vitals:   06/09/23 0737 06/09/23 0745 06/09/23 0800 06/09/23 0815  BP:  (!) 82/63 (!) 79/62 (!) 82/65  Pulse: (!) 135 (!) 133 (!) 135 (!) 133  Resp: 10 15 11 10   Temp:      TempSrc:      SpO2: 92% 92% 92% 92%  Weight:      Height:         Intake/Output Summary (Last 24 hours) at 06/09/2023 2952 Last data filed at 06/09/2023 0600 Gross per 24 hour  Intake 2579.94 ml  Output 0 ml  Net 2579.94 ml     PHYSICAL EXAM General: Ill appearing, well nourished, in no acute distress laying nearly flat in hospital bed. HEENT: Normocephalic and atraumatic. Neck: No JVD.  Lungs: Normal respiratory effort on room air. Clear bilaterally to auscultation. No wheezes, crackles, rhonchi.  Heart: irregularly irregular, fast rate. Normal S1 and S2 without gallops or murmurs. Radial & DP pulses 2+ bilaterally. Abdomen: Non-distended appearing.  Msk: Normal strength and tone for age. Extremities: No clubbing, cyanosis or edema.   Neuro: Alert and oriented X 3. Psych: Mood appropriate, affect congruent.     LABS: Basic Metabolic Panel: Recent Labs    06/08/23 0410 06/08/23 2036 06/09/23 0649  NA 134*  --  135  K 5.7* 5.0 5.3*  CL 99  --  96*  CO2 18*  --  18*  GLUCOSE 166*  --  201*  BUN 76*  --  94*  CREATININE 2.71*  --  3.70*  CALCIUM 7.8*  --  7.8*  MG 2.8*  --  2.9*  PHOS 7.0*  --  8.3*   Liver Function Tests: Recent Labs    06/07/23 1830 06/07/23 2254 06/09/23 0649  AST 272* 268*  --   ALT 124* 117*  --   ALKPHOS 646* 583*  --   BILITOT 5.4* 5.8*  --   PROT 4.8* 5.0*  --   ALBUMIN 1.6* 2.1* 1.9*   No results for input(s): "LIPASE", "AMYLASE" in the last 72 hours. CBC: Recent Labs    06/08/23 0410 06/09/23 0649  WBC 16.0* 17.8*  HGB 13.0 12.9*  HCT 37.5* 37.7*  MCV 80.5 79.0*  PLT 327 357   Cardiac Enzymes: Recent Labs    06/06/23 1250  TROPONINIHS 40*   BNP: Recent Labs    06/06/23 1250  BNP 235.1*   D-Dimer: No results for input(s): "DDIMER" in the last 72 hours.  Hemoglobin A1C: No results for input(s): "HGBA1C" in the last 72 hours.  Fasting Lipid Panel: No results for input(s): "CHOL", "HDL", "LDLCALC", "TRIG", "CHOLHDL", "LDLDIRECT" in the last 72 hours. Thyroid Function Tests: No  results for input(s): "TSH", "T4TOTAL", "T3FREE", "THYROIDAB" in the last 72 hours.  Invalid input(s): "FREET3"  Anemia Panel: No results for input(s): "VITAMINB12", "FOLATE", "FERRITIN", "TIBC", "IRON", "RETICCTPCT" in the last 72 hours.  US Venous Img Lower Unilateral Right (DVT)  Result Date: 06/08/2023 CLINICAL DATA:  Positive D-dimer Edema EXAM: RIGHT LOWER EXTREMITY VENOUS DOPPLER ULTRASOUND TECHNIQUE: Gray-scale sonography with compression, as well as color and duplex ultrasound, were performed to evaluate the deep venous system(s) from the level of the common femoral vein through the popliteal and proximal calf veins. COMPARISON:  None available FINDINGS: VENOUS Normal compressibility of the common femoral, superficial femoral, and popliteal  veins, as well as the visualized calf veins. Visualized portions of profunda femoral vein and great saphenous vein unremarkable. No filling defects to suggest DVT on grayscale or color Doppler imaging. Doppler waveforms show normal direction of venous flow, normal respiratory plasticity and response to augmentation. OTHER None. Limitations: none IMPRESSION: No right lower extremity DVT. Electronically Signed   By: Acquanetta Belling M.D.   On: 06/08/2023 14:47   NM Hepatobiliary Liver Func  Result Date: 06/07/2023 CLINICAL DATA:  Liver metastasis. Abdominal pain. Elevated bilirubin. EXAM: NUCLEAR MEDICINE HEPATOBILIARY IMAGING TECHNIQUE: Sequential images of the abdomen were obtained out to 60 minutes following intravenous administration of radiopharmaceutical. RADIOPHARMACEUTICALS:  7.1 mCi Tc-60m  Choletec IV COMPARISON:  MRI 06/03/2023, ultrasound 06/08/2023 FINDINGS: Heterogeneous uptake radiotracer in liver consistent with multiple hepatic metastasis. Counts into the small bowel by 20 minutes. Gallbladder is not clearly identified. IV morphine was administered. No evidence of gallbladder filling. IMPRESSION: 1. The gallbladder is not confidently identified post morphine however, the gallbladder is severely contracted on comparison ultrasound and MRI. Recommend clinical correlation for acute cholecystitis. 2. Heterogeneous abnormal pattern of radiotracer accumulation in the liver consistent multifocal hepatic metastasis. 3. Patent common bile duct. Electronically Signed   By: Genevive Bi M.D.   On: 06/07/2023 19:53   ECHOCARDIOGRAM COMPLETE  Result Date: 06/07/2023    ECHOCARDIOGRAM REPORT   Patient Name:   Jordan [name] Date of Exam: 06/07/2023 Medical Rec #:  [id number]   Height:       71.0 in Accession #:    [id number]  Weight:       247.8 lb Date of Birth:  02/03/1946   BSA:          2.310 m Patient Age:    77 years    BP:           108/72 mmHg Patient Gender: M           HR:           139 bpm. Exam  Location:  [facility] Procedure: 2D Echo, Cardiac Doppler and Color Doppler Indications:     Atrial Flutter  History:         Patient has no prior history of Echocardiogram examinations.                  CAD, Abnormal ECG, Arrythmias:Atrial Flutter,                  Signs/Symptoms:Dizziness/Lightheadedness; Risk                  Factors:Hypertension, Diabetes and Dyslipidemia. CKD, CA with                  mets.  Sonographer:     Mikki Harbor Referring Phys:  [id number] KELLY A GRIFFITH Diagnosing Phys: Clotilde Dieter  Sonographer Comments: Technically difficult study  due to poor echo windows, no apical window, suboptimal parasternal window and patient is obese. Image acquisition challenging due to respiratory motion. IMPRESSIONS  1. Left ventricular ejection fraction, by estimation, is 55 to 60%. Left ventricular ejection fraction by PLAX is 67 %. The left ventricle has normal function. The left ventricle has no regional wall motion abnormalities. Left ventricular diastolic parameters are indeterminate.  2. Right ventricular systolic function is normal. The right ventricular size is normal.  3. The mitral valve is grossly normal. Trivial mitral valve regurgitation.  4. The aortic valve is grossly normal. Aortic valve regurgitation is not visualized. FINDINGS  Left Ventricle: Left ventricular ejection fraction, by estimation, is 55 to 60%. Left ventricular ejection fraction by PLAX is 67 %. The left ventricle has normal function. The left ventricle has no regional wall motion abnormalities. The left ventricular internal cavity size was normal in size. There is no left ventricular hypertrophy. Left ventricular diastolic parameters are indeterminate. Right Ventricle: The right ventricular size is normal. No increase in right ventricular wall thickness. Right ventricular systolic function is normal. Left Atrium: Left atrial size was normal in size. Right Atrium: Right atrial size was normal in size. Pericardium: There is  no evidence of pericardial effusion. Mitral Valve: The mitral valve is grossly normal. Trivial mitral valve regurgitation. Tricuspid Valve: The tricuspid valve is grossly normal. Tricuspid valve regurgitation is trivial. Aortic Valve: The aortic valve is grossly normal. Aortic valve regurgitation is not visualized. Pulmonic Valve: The pulmonic valve was grossly normal. Pulmonic valve regurgitation is not visualized. Aorta: The aortic root was not well visualized. IAS/Shunts: No atrial level shunt detected by color flow Doppler.  LEFT VENTRICLE PLAX 2D LV EF:         Left ventricular ejection fraction by PLAX is 67 %. LVIDd:         3.30 cm LVIDs:         2.10 cm LV PW:         1.80 cm LV IVS:        1.60 cm LVOT diam:     2.00 cm LVOT Area:     3.14 cm  LEFT ATRIUM         Index LA diam:    4.10 cm 1.78 cm/m                        PULMONIC VALVE AORTA                 PV Vmax:       0.71 m/s Ao Root diam: 4.00 cm PV Peak grad:  2.0 mmHg   SHUNTS Systemic Diam: 2.00 cm Designer, multimedia signed by Clotilde Dieter Signature Date/Time: 06/07/2023/2:10:21 PM    Final      ECHO as above  TELEMETRY reviewed by me 06/09/23: atrial flutter rate 130s  EKG reviewed by me 06/09/23: atrial flutter rate 139 bpm  DATA reviewed by me 06/09/23: last 24h vitals tele labs imaging I/O hospitalist progress note, PCCM notes  Principal Problem:   Severe sepsis (HCC) Active Problems:   Acute renal failure superimposed on stage 3a chronic kidney disease (HCC)   Essential hypertension   Metastatic carcinoma involving liver, 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 male with  a past medical history of hypertension, hyperlipidemia, 60% stenosis mid LAD by cardiac catheterization (05/10/2008)  who presented to the ED on 06/19/2023 for tachycardia and hypotension. Found to be in atrial flutter, cardiology was consulted for further evaluation.   # Atrial flutter Patient with rates in the 140s on admission found to be in atrial flutter on EKG. No prior hx. Patient with SOB, no chest pain or palpitations. Has not responded to IV pushes/po AV blockers, BP could not tolerate labetalol gtt.  -Continue prn IV metoprolol for HR. Given underlying shock, metastatic cancer rate control will be difficult and current rates are likely as optimized as patient will be. Unable to tolerate more rate control medication due to hypotension.  -Continue IV heparin.   # Sepsic shock # Liver failure # Acute kidney injury Patient presenting with tachycardia, hypotension. Concern for sepsis, recently treated for UTI. Lactic acid elevated on admission. Cr on AM labs today 2.71, uptrending. -Remains on neo synephrine. -Management per primary.   # Metastatic cancer Patient with metastatic cancer to liver, bone, and lymph nodes.  -Palliative consulted per primary.  This patient's case was discussed and created with Dr. Melton Alar and she is in agreement.  Signed:  Gale Journey, PA-C  06/09/2023, 9:05 AM [facility]

## 2023-06-09 NOTE — Progress Notes (Signed)
Jordan [name]   DOB:06/21/1946   VH#:[id number]    Subjective: Patient continues to be on pressors.  Intermittently confused.  No significant improvement clinically or based on lab work.  Patient is accompanied by multiple family members.  Patient voiced his preference for nonaggressive options.  Objective:  Vitals:   06/09/23 1245 06/09/23 1300  BP: (!) 76/53 97/74  Pulse: (!) 139 (!) 140  Resp: (!) 25 19  Temp:    SpO2: 94% 94%     Intake/Output Summary (Last 24 hours) at 06/09/2023 1332 Last data filed at 06/09/2023 1200 Gross per 24 hour  Intake 3656.05 ml  Output 0 ml  Net 3656.05 ml    Physical Exam Vitals and nursing note reviewed.  HENT:     Head: Normocephalic and atraumatic.     Mouth/Throat:     Pharynx: Oropharynx is clear.  Eyes:     Extraocular Movements: Extraocular movements intact.     Pupils: Pupils are equal, round, and reactive to light.  Cardiovascular:     Rate and Rhythm: Normal rate and regular rhythm.  Pulmonary:     Comments: Decreased breath sounds bilaterally.  Abdominal:     Palpations: Abdomen is soft.  Musculoskeletal:        General: Normal range of motion.     Cervical back: Normal range of motion.  Skin:    General: Skin is warm.  Neurological:     General: No focal deficit present.     Mental Status: He is alert.      Labs:  Lab Results  Component Value Date   WBC 17.8 (H) 06/09/2023   HGB 12.9 (L) 06/09/2023   HCT 37.7 (L) 06/09/2023   MCV 79.0 (L) 06/09/2023   PLT 357 06/09/2023   NEUTROABS 15.1 (H) 06/12/2023    Lab Results  Component Value Date   NA 135 06/09/2023   K 5.3 (H) 06/09/2023   CL 96 (L) 06/09/2023   CO2 18 (L) 06/09/2023    Studies:  US Venous Img Lower Unilateral Right (DVT)  Result Date: 06/08/2023 CLINICAL DATA:  Positive D-dimer Edema EXAM: RIGHT LOWER EXTREMITY VENOUS DOPPLER ULTRASOUND TECHNIQUE: Gray-scale sonography with compression, as well as color and duplex ultrasound, were performed to  evaluate the deep venous system(s) from the level of the common femoral vein through the popliteal and proximal calf veins. COMPARISON:  None available FINDINGS: VENOUS Normal compressibility of the common femoral, superficial femoral, and popliteal veins, as well as the visualized calf veins. Visualized portions of profunda femoral vein and great saphenous vein unremarkable. No filling defects to suggest DVT on grayscale or color Doppler imaging. Doppler waveforms show normal direction of venous flow, normal respiratory plasticity and response to augmentation. OTHER None. Limitations: none IMPRESSION: No right lower extremity DVT. Electronically Signed   By: Acquanetta Belling M.D.   On: 06/08/2023 14:47   NM Hepatobiliary Liver Func  Result Date: 06/07/2023 CLINICAL DATA:  Liver metastasis. Abdominal pain. Elevated bilirubin. EXAM: NUCLEAR MEDICINE HEPATOBILIARY IMAGING TECHNIQUE: Sequential images of the abdomen were obtained out to 60 minutes following intravenous administration of radiopharmaceutical. RADIOPHARMACEUTICALS:  7.1 mCi Tc-92m  Choletec IV COMPARISON:  MRI 06/03/2023, ultrasound 06/16/2023 FINDINGS: Heterogeneous uptake radiotracer in liver consistent with multiple hepatic metastasis. Counts into the small bowel by 20 minutes. Gallbladder is not clearly identified. IV morphine was administered. No evidence of gallbladder filling. IMPRESSION: 1. The gallbladder is not confidently identified post morphine however, the gallbladder is severely contracted on comparison ultrasound and MRI.  Recommend clinical correlation for acute cholecystitis. 2. Heterogeneous abnormal pattern of radiotracer accumulation in the liver consistent multifocal hepatic metastasis. 3. Patent common bile duct. Electronically Signed   By: Genevive Bi M.D.   On: 06/07/2023 19:27     # 77 year old male patient with new diagnosis of adenocarcinoma-metastatic liver/bone of unknown primary is currently admitted to hospital for  hypotension tachycardia lactic acidosis- multi-organ failure/sepsis.    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  Currently unknown clear primary however suspect GI origin.  Significant elevated CEA/CA 19-9 cancer markers-see discussion below-   # Elevated liver enzymes/bilirubin-likely secondary to significant tumor burden rather than obstructive disease-   # Worsening renal function/hyperkalemia   # Atrial flutter/fibrillation on IV heparin-status post cardiology evaluation.   # DNR/DNI  # Recommendations/plan:   # I had a long since the patient's wife and family [3 sons; and also patient's sister-in-law] -regarding the patient's multiorgan failure/and also the fact that patient's current situation is likely driven by underlying malignancy.  Given patient's tenuous blood pressure-and the multiorgan dysfunction I recommend no further workup for PET scan-as patient is likely not to benefit from any chemotherapy.  Patient is at a very high risk of complication of chemotherapy including needing for intubation/hemodialysis/sudden cardiac death.  As per the family, patient has declined hemodialysis as an option and also voiced concerns about keeping him comfortable rather than aggressive therapies.  Recommend comfort measures.  Patient s/p evaluation with palliative care.  Will transition to comfort measures.  Appreciate the efforts of the palliative care team and also ICU team in taking care of the patient.  # 40 minutes face-to-face with the patient discussing the above plan of care; more than 50% of time spent on prognosis/ natural history; counseling and coordination.  Earna Coder, MD 06/09/2023  1:32 PM

## 2023-06-09 NOTE — Progress Notes (Signed)
Received pt. resting comfortably with family @ bedside no sign  of restlessness nor pain noted.Will continue to provide comfort care.

## 2023-06-09 NOTE — Progress Notes (Signed)
Daily Progress Note   Patient Name: Jordan [name]       Date: 06/09/2023 DOB: Dec 21, 1945  Age: 77 y.o. MRN#: [id number] Attending Physician: Erin Fulling, MD Primary Care Physician: Marina Goodell, MD Admit Date: 06/18/2023  Reason for Consultation/Follow-up: Establishing goals of care  HPI/Brief Hospital Review:  77 y.o. 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   Underwent liver biopsy on 1024 revealing poorly differentiated adenocarcinoma with extensive necrosis Has been seen by oncology who discussed with Jordan [name] that unfortunately his treatment options would not have a goal of cure but would be palliative treatment   Has also recently been seen in ED for abdominal pain without acute findings, underwent MRCP on 10/31 (-) and has recently been treated for UTI with appropriate antibiotics   In ED met sepsis criteria due to tachycardia as well as lactic acidosis   Admitted and being treated for severe sepsis likely secondary to UTI versus intra-abdominal source related to biliary disease.    Developed worsening tachycardia as well as hypotension, required transfer to ICU with initiation of vasopressors   Palliative medicine was consulted for assisting with goals of care conversations  Subjective: Extensive chart review has been completed prior to meeting patient including labs, vital signs, imaging, progress notes, orders, and available advanced directive documents from current and previous  encounters.    Requested by nursing staff as well as ICU team to visit with patient and family to discuss goals of care. Per nursing staff, Jordan [name] has shared he wishes to be made comfortable and is no longer interested in pursuing interventions/treatment.  Visited with Jordan [name] at his bedside, he is awake, alert, has intermittent confusion. Met with his wife-Barbara and son outside room at their request. Britta Mccreedy shares the ICU team has been communicating with her and have discussed the severity of Jordan [name] condition-multi organ failure due to underlying infection versus malignancy. Britta Mccreedy shares Jordan [name] has been clear with her in sharing he is no longer interested in aggressive treatments or interventions and wants to be  made comfortable.  We discussed transition to comfort measures, Jordan [name] would no longer receive aggressive medical interventions such as continuous vital signs, lab work, radiology testing, or medications not focused on comfort. All care would focus on how the patient is looking and feeling. This would include management of any symptoms that may cause discomfort, pain, shortness of breath, cough, nausea, agitation, anxiety, and/or secretions etc. Symptoms would be managed with medications and other non-pharmacological interventions such as spiritual support if requested, repositioning, music therapy, or therapeutic listening. Family verbalized understanding and appreciation. Britta Mccreedy and son verbalize understanding and wish to proceed with comfort measures at this time. We agreed to continue vasopressor support until family members including all sons had a chance to visit also with an understanding we would not escalate vasopressor support if hypotension worsened for which they verbalized understanding and agreement.  Spoke with Jordan [name]-niece (ICU NP at [facility]) reviewed comfort measures and provided lab results at her request.  Per [facility] and discussions with nursing staff, Mr.  [name] has been responding well to hydromorphone for pain control-will liberalize as needed frequency and add Lorazepam for anxiety and Haldol for agitation as needed. Recommend initiating continuous hydromorphone infusion if pain becomes uncontrolled.  Family to make nursing staff aware once all visitors have been by.  Answered and addressed all questions and concerns.  PMT to remain available for ongoing needs and support.    Palliative Care Assessment & Plan   Assessment/Recommendation/Plan  DNR/DNI/Comfort Orders adjusted as noted above PMT to continue to follow for ongoing needs and support   Care plan was discussed with ICU team and nursing staff  Thank you for allowing the Palliative Medicine Team to assist in the care of this patient.  Total time:  50 minutes  Time spent includes: Detailed review of medical records (labs, imaging, vital signs), medically appropriate exam (mental status, respiratory, cardiac, skin), discussed with treatment team, counseling and educating patient, family and staff, documenting clinical information, medication management and coordination of care.  Jordan Deed, DNP, AGNP-C Palliative Medicine   Please contact Palliative Medicine Team phone at [phone] for questions and concerns.

## 2023-06-09 NOTE — Progress Notes (Signed)
NAME:  Jordan [name], MRN:  [id number], DOB:  11/02/1945, LOS: 5 ADMISSION DATE:  06/12/2023, CONSULTATION DATE:  06/16/2023 REFERRING MD:  Dr. Derrill Kay, CHIEF COMPLAINT:  Hypotension, weakness   Brief Pt Description / Synopsis:  77 y.o. 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  History of Present Illness:  77 y.o. male with medical history significant for DM, CAD, HTN, CKD llla, with fairly recent diagnosis of metastatic CA involving liver with unknown primary s/p biopsy who went for port placement with IR  11/1 but was sent to the ED due to concerns for protracted weakness and concerns for sepsis.     Patient had an E. coli UTI diagnosed on 10/11 patient was seen in the ED on 10/28 for RUQ pain and workup with CT was about the same as prior findings.     He did received an NS bolus for hyponatremia of 128 and AKI of 1.89.  WBC 17,000 but he did not meet sepsis criteria.     He was started on Keflex for UTI (had admission for urosepsis March 2024) but he has only taken 1 dose due to poor oral intake and generally feeling unwell.  He has vague abdominal pain Mostly in the periumbilical and lower abdomen going from left to right.  Denies vomiting, diarrhea, constipation or dysuria.  His main complaint is generalized malaise.   Patient was admitted and started on empiric broad spectrum antibiotics.   RUQ U/S showed multiple liver mets, small volume ascites, gallbladder sludge, CBD diameter 5.2 mm, no sonographic Murphy sign, gallgbladder wall thickening (aslo seen previously).   MRCP negative on 10/31 obtained per Oncologist. Negative for choledocholithiasis.  Showed extensive liver metastatic disease, lymphadenopathy likely nodal mets, enhancement of  vertebral bodies suspicious for osseous mets.  Cholelithiasis.   Status post liver biopsy on 05/27/2023 showed poorly differentiated adenocarcinoma with extensive necrosis. IHC positive for CK7, CK20, CDX2, negative for GATA3, PAX8, TTF-1, S100 and CD1 1 7. Differentials include GI origin, pancreaticobiliary system, head and neck region. Intrahepatic cholangiocarcinoma cannot be entirely excluded.   Please see "Significant Hospital Events" section below for full detailed hospital course.   Micro Data:  11/1: Blood cultures x2>> no growth 11/1: Urine>> no growth 11/4: MRSA PCR>>negative  Antimicrobials:   Anti-infectives (From admission, onward)    Start     Dose/Rate Route Frequency Ordered Stop   06/08/23 2200  ceFEPIme (MAXIPIME) 2 g in sodium chloride 0.9 % 100 mL IVPB        2 g 200 mL/hr over 30 Minutes Intravenous Every 24 hours 06/08/23 0824 06/12/23 2159   06/05/23 1800  vancomycin (VANCOREADY) IVPB 1250 mg/250 mL  Status:  Discontinued        1,250 mg 166.7 mL/hr over 90 Minutes Intravenous Every 36 hours 06/16/2023 2024 06/05/23 1141   06/05/23 1800  vancomycin (VANCOREADY) IVPB 1250 mg/250 mL  Status:  Discontinued        1,250 mg 166.7 mL/hr over 90 Minutes Intravenous Every 24 hours 06/05/23 1141 06/06/23 0933   06/08/2023 2200  metroNIDAZOLE (FLAGYL) IVPB 500 mg        500 mg 100 mL/hr over 60 Minutes Intravenous Every 12 hours 06/12/2023 2010 06/11/23 2159   06/18/2023 2200  ceFEPIme (MAXIPIME) 2 g in sodium chloride 0.9 % 100 mL IVPB  Status:  Discontinued        2 g 200 mL/hr over 30 Minutes Intravenous Every 12 hours 06/20/2023 2020 06/08/23 0823   06/17/2023 2015  ceFEPIme (MAXIPIME) 2 g in sodium chloride 0.9 % 100 mL IVPB  Status:  Discontinued        2 g 200 mL/hr over 30 Minutes Intravenous  Once 07/02/2023 2010 06/26/2023 2020   07/03/2023 2015  vancomycin (VANCOCIN) IVPB 1000 mg/200 mL premix  Status:  Discontinued        1,000 mg 200 mL/hr over 60 Minutes Intravenous   Once 06/19/2023 2010 06/06/2023 2020   06/11/2023 1615  ceFEPIme (MAXIPIME) 2 g in sodium chloride 0.9 % 100 mL IVPB        2 g 200 mL/hr over 30 Minutes Intravenous  Once 06/26/2023 1606 06/06/2023 1626   06/28/2023 1615  vancomycin (VANCOCIN) IVPB 1000 mg/200 mL premix        1,000 mg 200 mL/hr over 60 Minutes Intravenous  Once 06/20/2023 1606 06/06/2023 1736        Significant Hospital Events: Including procedures, antibiotic start and stop dates in addition to other pertinent events   11/1 pt persistently tachycardic.  Lactic acid improving 6.2 >> 5.5 >> 3.2 11/2 ongoing tachycardia HR 130's to 140's, Lactic acid remains in 3's >> 4.1 11/3 persistent tachycardic and lactic acidosis, HR's do not improve with metoprolol, soft BP's  cardiology consulted.    11/4 severe acidosis and acute renal failure 11/5 remains on pressors 11/6: Remains on low dose Neo synephrine, Worsening AKI. Palliative Care reengaged, pt and family considering comfort measures  Interim History / Subjective:  -No significant events noted overnight -Afebrile, continues to require low dose peripheral Neo-synephrine -anuric overnight, AKI worsened with Creatinine up to 3.7 from 2.7 with mild hyperkalemia and persistent metabolic acidosis despite Bicarb gtt -Leukocytosis slightly worsened to 17.8 K from 16 K -Palliative Care reengaged, pt and family considering transition to comfort measures  Objective   Blood pressure (!) 82/65, pulse (!) 133, temperature 98.1 F (36.7 C), temperature source Axillary, resp. rate 10, height 5\' 11"  (1.803 m), weight 122.1 kg, SpO2 92%.        Intake/Output Summary (Last 24 hours) at 06/09/2023 0848 Last data filed at 06/09/2023 0600 Gross per 24 hour  Intake 2579.94 ml  Output 0 ml  Net 2579.94 ml   Filed Weights   06/07/23 0410 06/08/23 0500 06/09/23 0459  Weight: 112.4 kg 114 kg 122.1 kg    Examination: General: Acute on chronically ill appearing male, laying in bed, on room air, in  NAD HENT: Atraumatic, normocephalic, neck supple, difficult to assess JVD due to body habitus Lungs: Diminished breath sounds throughout, even, nonlabored, normal effort Cardiovascular: Tachycardia, regular rhythm, no M/R/G Abdomen: Obese, soft, nontender, nondistended, no guarding or rebound tenderness, BS hypoactive Extremities: Generalized weakness, 2+ edema BLE, no cyanosis Neuro: Awake and alert, oriented to person and place, moves all extremities to commands, no focal deficits GU: Deferred  Resolved Hospital Problem list     Assessment & Plan:   #Shock: Septic #Mildly elevated Troponin, suspect demand ischemia #Atrial Flutter w/ RVR Echocardiogram 06/07/23: LVEF 55-60%, indeterminate diastolic parameters, RV systolic function normal -Continuous cardiac monitoring -Maintain MAP >65 -IV fluids -Vasopressors as needed to maintain MAP goal -Continue Stress dose steroids -Trend lactic acid until normalized (>9 ~ 4.1 ~ 4.2 ~ 4.2 ~) -Trend HS Troponin until peaked -Cardiology following, appreciate input -Continue Heparin gtt for [facility]  #Meets SIRS Criteria in setting  of malignancy vs ? Underlying infection #? 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  Currently unknown clear primary however suspect GI origin  RUQ [place]: multiple liver  masses (metastatic disease), small amount of ascites, gallstones and sludge, gallbladder wall thickening likely due to liver disease HIDA: gallbladder severely contracted, ? Acute cholecystitis, multifocal livers mets, patent CBD -Monitor LFT's and coags -Oncology and Palliative Care following, appreciate input -Per Oncology, recommends that pt would need to start chemotherapy once clinically stable (currently too unstable) and need PET scan ~ felt to be at high risk of complications form chemo  #Acute Metabolic Encephalopathy due to sepsis and multiple metabolic derangements -Treatment of metabolic derangements as outlined above -Provide supportive care -Promote normal sleep/wake cycle and family presence -Avoid sedating medications as able       Pt is critically ill with severe sepsis with septic shock with multiorgan failure (AKI, liver failure, encephalopathy).  Prognosis is extremely guarded, high risk for further decompensation, cardiac arrest and death.  Given current critical illness superimposed on metastatic cancer, overall long term prognosis is extremely poor.  Pt is DNR/DNI, recommend consideration of comfort measures.  Palliative Care is following for ongoing GOC.   Best Practice (right click and "Reselect all SmartList Selections" daily)   Diet/type: Regular consistency (see orders) DVT prophylaxis: systemic heparin GI prophylaxis: N/A Lines: N/A Foley:  N/A Code Status:  DNR Last date of multidisciplinary goals of care discussion [11/6]  11/6: Pt updated at bedside.  Will update family when they arrive at bedside.  Labs   CBC: Recent Labs  Lab 07/01/2023 1520 06/18/2023 2102 06/06/23 0547 06/07/23 0557 06/08/23 0410 06/09/23 0649  WBC 17.9* 17.8* 17.7* 16.3* 16.0* 17.8*  NEUTROABS 15.1*  --   --   --   --   --   HGB 15.2 13.9 14.1 14.3 13.0 12.9*  HCT 45.0 40.6 40.3 41.1 37.5* 37.7*  MCV 79.8* 80.6 78.3* 78.3* 80.5 79.0*  PLT 329 300 313 324 327 357     Basic Metabolic Panel: Recent Labs  Lab 06/05/23 2119 06/06/23 0547 06/07/23 1632 06/07/23 1830 06/07/23 2254 06/08/23 0410 06/08/23 2036 06/09/23 0649  NA 131*   < > 129* 134* 133* 134*  --  135  K 4.9   < > 4.5 5.4* 5.2* 5.7* 5.0 5.3*  CL 99   < > 104 102 101 99  --  96*  CO2 21*   < > 9* 18* 17* 18*  --  18*  GLUCOSE 166*   < > 66* 125* 160* 166*  --  201*  BUN 57*   < > 37* 70* 75* 76*  --  94*  CREATININE 1.87*   < > 1.03 2.47* 2.67* 2.71*  --  3.70*  CALCIUM 7.9*   < > 6.4* 7.6* 7.4* 7.8*  --  7.8*  MG 2.9*  --   --   --   --  2.8*  --  2.9*  PHOS  --   --   --   --   --  7.0*  --  8.3*   < > = values in this interval not displayed.   GFR: Estimated Creatinine Clearance: 22.6 mL/min (A) (by C-G formula based on SCr of 3.7 mg/dL (H)). Recent Labs  Lab 06/29/2023 2102 06/05/23 0842 06/06/23 0547 06/06/23 1250 06/07/23 0557 06/07/23 1633 06/07/23 1941 06/07/23 2254 06/08/23 0410 06/09/23 0649  PROCALCITON 4.04  --  4.89  --   --   --   --   --   --   --   WBC 17.8*  --  17.7*  --  16.3*  --   --   --  16.0* 17.8*  LATICACIDVEN  --    < > 4.1*   < > 3.3* >9.0* 4.1* 4.2* 4.2*  --    < > = values in this interval not displayed.    Liver Function Tests: Recent Labs  Lab 06/06/23 0547 06/07/23 0557 06/07/23 1632 06/07/23 1830 06/07/23 2254 06/09/23 0649  AST 420* 331* 117* 272* 268*  --   ALT 171* 150* 55* 124* 117*  --   ALKPHOS 846* 756* 275* 646* 583*  --   BILITOT 7.0* 6.4* 2.4* 5.4* 5.8*  --   PROT 5.4* 5.6* <3.0* 4.8* 5.0*  --   ALBUMIN 2.0* 2.0* <1.5* 1.6* 2.1* 1.9*   No results for input(s): "LIPASE", "AMYLASE" in the last 168 hours. No results for input(s): "AMMONIA" in the last 168 hours.  ABG    Component Value Date/Time   PHART 7.37 06/07/2023 2238   PCO2ART 31 (L) 06/07/2023 2238   PO2ART 71 (L) 06/07/2023 2238   HCO3 17.9 (L) 06/07/2023 2238   ACIDBASEDEF 6.2 (H) 06/07/2023 2238   O2SAT 95.1 06/07/2023 2238     Coagulation  Profile: Recent Labs  Lab 06/17/2023 1520 06/05/23 0409 06/06/23 1552  INR 1.5* 1.5* 1.7*    Cardiac Enzymes: No results for input(s): "CKTOTAL", "CKMB", "CKMBINDEX", "TROPONINI" in the last 168 hours.  HbA1C: Hgb A1c MFr Bld  Date/Time Value Ref Range Status  06/29/2023 03:20 PM 6.8 (H) 4.8 - 5.6 % Final    Comment:    (NOTE) Pre diabetes:          5.7%-6.4%  Diabetes:              >6.4%  Glycemic control for   <7.0% adults with diabetes   12/04/2021 02:32 AM 7.9 (H) 4.8 - 5.6 % Final    Comment:    (NOTE) Pre diabetes:          5.7%-6.4%  Diabetes:              >6.4%  Glycemic control for   <7.0% adults with diabetes     CBG: Recent Labs  Lab 06/08/23 0750 06/08/23 1200 06/08/23 1524 06/08/23 2301 06/09/23 0816  GLUCAP 182* 201* 190* 203* 181*    Review of Systems:   Positives in BOLD: Gen: Denies fever, chills, weight change, fatigue, night sweats HEENT: Denies blurred vision, double vision, hearing loss, tinnitus, sinus congestion, rhinorrhea, sore throat, neck stiffness, dysphagia PULM: Denies shortness of breath, cough, sputum production, hemoptysis, wheezing CV: Denies chest pain, edema, orthopnea, paroxysmal nocturnal dyspnea, palpitations GI: Denies abdominal pain, nausea, vomiting, diarrhea, hematochezia, melena, constipation, change in bowel habits GU: Denies dysuria, hematuria, polyuria, oliguria, urethral discharge Endocrine: Denies hot or cold intolerance, polyuria, polyphagia or appetite change Derm: Denies rash, dry skin, scaling or peeling skin change Heme: Denies easy bruising, bleeding, bleeding gums Neuro: Denies headache, numbness, weakness, slurred speech,  loss of memory or consciousness   Past Medical History:  He,  has a past medical history of Anemia, Arthritis, Coronary artery disease, Diabetes mellitus without complication (HCC), Diverticulosis, Duodenal ulcer, Erectile dysfunction, GERD (gastroesophageal reflux disease),  Hyperlipidemia, and Hypertension.   Surgical History:   Past Surgical History:  Procedure Laterality Date   CARDIAC CATHETERIZATION     COLONOSCOPY WITH PROPOFOL N/A 05/31/2017   Procedure: COLONOSCOPY WITH PROPOFOL;  Surgeon: Scot Jun, MD;  [facility] ENDOSCOPY;  Service: Endoscopy;  Laterality: N/A;   JOINT REPLACEMENT Left    knee   THORACIC DISCECTOMY N/A 08/09/2019   Procedure: T11-12 POSTERIOR FUSION WITH TRANSPEDICULAR DECOMPRESSION;  Surgeon: Venetia Night, MD;  Location: [facility] ORS;  Service: Neurosurgery;  Laterality: N/A;   VASECTOMY       Social History:   reports that he quit smoking about 35 years ago. His smoking use included cigarettes. He has never used smokeless tobacco. He reports that he does not drink alcohol and does not use drugs.   Family History:  His family history includes Hypertension in his mother.   Allergies Allergies  Allergen Reactions   Actos [Pioglitazone] Hives   Amoxicillin Hives and Rash     Home Medications  Prior to Admission medications   Medication Sig Start Date End Date Taking? Authorizing Provider  amLODipine (NORVASC) 10 MG tablet Take 10 mg by mouth daily.   Yes [provider]  ascorbic acid (VITAMIN C) 1000 MG tablet Take 1,000 mg by mouth daily.   Yes [provider]  atorvastatin (LIPITOR) 20 MG tablet Take 20 mg by mouth daily.   Yes [provider]  cholecalciferol (VITAMIN D) 25 MCG (1000 UT) tablet Take 1,000 Units by mouth daily.   Yes [provider]  CINNAMON PO Take 1,000 mg by mouth daily.    Yes [provider]  glipiZIDE (GLUCOTROL) 5 MG tablet Take 5 mg by mouth daily. 07/17/19  Yes [provider]  hydrochlorothiazide (HYDRODIURIL) 25 MG tablet Take 25 mg by mouth daily.   Yes [provider]  magnesium oxide (MAG-OX) 400 MG tablet Take 400 mg by mouth daily.   Yes [provider]  meloxicam (MOBIC) 15 MG tablet Take 1 tablet (15  mg total) by mouth daily. Take with food. 04/20/23 04/19/24 Yes Drake Leach, PA-C  metFORMIN (GLUCOPHAGE) 1000 MG tablet Take 1,000 mg by mouth 2 (two) times daily with a meal.   Yes [provider]  omega-3 acid ethyl esters (LOVAZA) 1 g capsule Take 1 capsule by mouth 2 (two) times daily.   Yes [provider]  ramipril (ALTACE) 10 MG capsule Take 10 mg by mouth 2 (two) times daily.    Yes [provider]  Semaglutide, 2 MG/DOSE, 8 MG/3ML SOPN Inject 0.75 mLs into the skin once a week. (Tuesday) 08/24/22  Yes [provider]  traMADol (ULTRAM) 50 MG tablet Take 1 tablet (50 mg total) by mouth every 8 (eight) hours as needed. 06/01/23  Yes Michaelyn Barter, MD  carvedilol (COREG) 3.125 MG tablet Take 3.125 mg by mouth 2 (two) times daily. Patient not taking: Reported on 05/27/2023 10/28/21   [provider]  vitamin B-12 (CYANOCOBALAMIN) 1000 MCG tablet Take 1,000 mcg by mouth daily.    [provider]     Critical care time: 40 minutes     Harlon Ditty, AGACNP-BC [facility] Prefer epic messenger for cross cover needs If after hours, please call E-link

## 2023-06-09 NOTE — Plan of Care (Signed)
  Problem: Metabolic: Goal: Ability to maintain appropriate glucose levels will improve Outcome: Progressing   Problem: Nutritional: Goal: Maintenance of adequate nutrition will improve Outcome: Progressing   Problem: Skin Integrity: Goal: Risk for impaired skin integrity will decrease Outcome: Progressing   Problem: Fluid Volume: Goal: Hemodynamic stability will improve Outcome: Progressing   Problem: Clinical Measurements: Goal: Signs and symptoms of infection will decrease Outcome: Progressing   Problem: Coping: Goal: Ability to adjust to condition or change in health will improve Outcome: Not Progressing   Problem: Fluid Volume: Goal: Ability to maintain a balanced intake and output will improve Outcome: Not Progressing

## 2023-06-11 ENCOUNTER — Other Ambulatory Visit: Payer: Medicare HMO

## 2023-06-19 DIAGNOSIS — C801 Malignant (primary) neoplasm, unspecified: Secondary | ICD-10-CM | POA: Diagnosis not present

## 2023-06-22 ENCOUNTER — Encounter: Payer: Self-pay | Admitting: Internal Medicine

## 2023-06-26 DIAGNOSIS — C801 Malignant (primary) neoplasm, unspecified: Secondary | ICD-10-CM | POA: Diagnosis not present

## 2023-07-04 NOTE — Progress Notes (Signed)
Morphine drip started by another RN for pt. Comfort , family remained @ the bedside, needs attended and anticipated.

## 2023-07-04 NOTE — Death Summary Note (Signed)
DEATH SUMMARY   Patient Details  Name: Jordan [name] MRN: [id number] DOB: 23-Jun-1946  Admission/Discharge Information   Admit Date:  2023-06-22  Date of Death: Date of Death: 28-Jun-2023  Time of Death: Time of Death: 0140  Length of Stay: 6  Referring Physician: Marina Goodell, MD   Reason(s) for Hospitalization  Weakness & sepsis  Diagnoses  Preliminary cause of death: Metastatic cancer with unknown primary Secondary Diagnoses (including complications and co-morbidities):  Principal Problem:   Severe sepsis (HCC) Active Problems:   Acute renal failure superimposed on stage 3a chronic kidney disease (HCC)   Essential hypertension   Metastatic carcinoma involving liver, 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 year old male  with fairly recent diagnosis of metastatic CA involving liver with unknown primary s/p biopsy who went for port placement with IR  Jun 22, 2023 but was sent to the ED due to concerns for protracted weakness and concerns for sepsis.     Patient had an E. coli UTI diagnosed on 10/11 patient was seen in the ED on 10/28 for RUQ pain and workup with CT was about the same as prior findings.     He did received an NS bolus for hyponatremia of 128 and AKI of 1.89.  WBC 17,000 but he did not meet sepsis criteria.     He was started on Keflex for UTI (had admission for urosepsis March 2024) but he has only taken 1 dose due to poor oral intake and generally feeling unwell.  He has vague abdominal pain Mostly in the periumbilical and lower  abdomen going from left to right.  Denies vomiting, diarrhea, constipation or dysuria.  His main complaint is generalized malaise.   Patient was admitted and started on empiric broad spectrum antibiotics.   RUQ U/S showed multiple liver mets, small volume ascites, gallbladder sludge, CBD diameter 5.2 mm, no sonographic Murphy sign, gallgbladder wall thickening (aslo seen previously).   MRCP negative on 10/31 obtained per Oncologist. Negative for choledocholithiasis.  Showed extensive liver metastatic disease, lymphadenopathy likely nodal mets, enhancement of vertebral bodies suspicious for osseous mets.  Cholelithiasis.   Status post liver biopsy on 05/27/2023 showed poorly differentiated adenocarcinoma with extensive necrosis. IHC positive for CK7, CK20, CDX2, negative for GATA3, PAX8, TTF-1, S100 and CD1 1 7. Differentials include GI origin, pancreaticobiliary system, head and neck region. Intrahepatic cholangiocarcinoma cannot be entirely excluded.   Significant Hospital Events: Including procedures, antibiotic start and stop dates in addition to other pertinent events   06-22-23 pt persistently tachycardic.  Lactic acid improving 6.2 >> 5.5 >> 3.2 11/2 ongoing tachycardia HR 130's to 140's, Lactic acid remains in 3's >> 4.1 11/3 persistent tachycardic and lactic acidosis, HR's do not improve with metoprolol, soft BP's  cardiology consulted.    11/4 severe acidosis and acute renal failure 11/5 remains on pressors 11/6: Remains on low dose Neo synephrine, Worsening AKI. Palliative Care re-engaged, pt and family initiated comfort measures.  2023-06-28: patient passed away, with family present  Pertinent Labs and Studies  Significant  Diagnostic Studies US Venous Img Lower Unilateral Right (DVT)  Result Date: 06/08/2023 CLINICAL DATA:  Positive D-dimer Edema EXAM: RIGHT LOWER EXTREMITY VENOUS DOPPLER ULTRASOUND TECHNIQUE: Gray-scale sonography with compression, as well as color and duplex ultrasound,  were performed to evaluate the deep venous system(s) from the level of the common femoral vein through the popliteal and proximal calf veins. COMPARISON:  None available FINDINGS: VENOUS Normal compressibility of the common femoral, superficial femoral, and popliteal veins, as well as the visualized calf veins. Visualized portions of profunda femoral vein and great saphenous vein unremarkable. No filling defects to suggest DVT on grayscale or color Doppler imaging. Doppler waveforms show normal direction of venous flow, normal respiratory plasticity and response to augmentation. OTHER None. Limitations: none IMPRESSION: No right lower extremity DVT. Electronically Signed   By: Acquanetta Belling M.D.   On: 06/08/2023 14:47   NM Hepatobiliary Liver Func  Result Date: 06/07/2023 CLINICAL DATA:  Liver metastasis. Abdominal pain. Elevated bilirubin. EXAM: NUCLEAR MEDICINE HEPATOBILIARY IMAGING TECHNIQUE: Sequential images of the abdomen were obtained out to 60 minutes following intravenous administration of radiopharmaceutical. RADIOPHARMACEUTICALS:  7.1 mCi Tc-32m  Choletec IV COMPARISON:  MRI 06/03/2023, ultrasound 06/07/2023 FINDINGS: Heterogeneous uptake radiotracer in liver consistent with multiple hepatic metastasis. Counts into the small bowel by 20 minutes. Gallbladder is not clearly identified. IV morphine was administered. No evidence of gallbladder filling. IMPRESSION: 1. The gallbladder is not confidently identified post morphine however, the gallbladder is severely contracted on comparison ultrasound and MRI. Recommend clinical correlation for acute cholecystitis. 2. Heterogeneous abnormal pattern of radiotracer accumulation in the liver consistent multifocal hepatic metastasis. 3. Patent common bile duct. Electronically Signed   By: Genevive Bi M.D.   On: 06/07/2023 19:53   ECHOCARDIOGRAM COMPLETE  Result Date: 06/07/2023    ECHOCARDIOGRAM REPORT   Patient Name:   Jordan [name] Date of Exam: 06/07/2023  Medical Rec #:  [id number]   Height:       71.0 in Accession #:    [id number]  Weight:       247.8 lb Date of Birth:  1946-07-02   BSA:          2.310 m Patient Age:    77 years    BP:           108/72 mmHg Patient Gender: M           HR:           139 bpm. Exam Location:  [facility] Procedure: 2D Echo, Cardiac Doppler and Color Doppler Indications:     Atrial Flutter  History:         Patient has no prior history of Echocardiogram examinations.                  CAD, Abnormal ECG, Arrythmias:Atrial Flutter,                  Signs/Symptoms:Dizziness/Lightheadedness; Risk                  Factors:Hypertension, Diabetes and Dyslipidemia. CKD, CA with                  mets.  Sonographer:     Mikki Harbor Referring Phys:  [id number] KELLY A GRIFFITH Diagnosing Phys: Rozell Searing Custovic  Sonographer Comments: Technically difficult study due to poor echo windows, no apical window, suboptimal parasternal window and patient is obese. Image acquisition challenging due to respiratory motion. IMPRESSIONS  1. Left ventricular ejection fraction, by estimation, is 55  to 60%. Left ventricular ejection fraction by PLAX is 67 %. The left ventricle has normal function. The left ventricle has no regional wall motion abnormalities. Left ventricular diastolic parameters are indeterminate.  2. Right ventricular systolic function is normal. The right ventricular size is normal.  3. The mitral valve is grossly normal. Trivial mitral valve regurgitation.  4. The aortic valve is grossly normal. Aortic valve regurgitation is not visualized. FINDINGS  Left Ventricle: Left ventricular ejection fraction, by estimation, is 55 to 60%. Left ventricular ejection fraction by PLAX is 67 %. The left ventricle has normal function. The left ventricle has no regional wall motion abnormalities. The left ventricular internal cavity size was normal in size. There is no left ventricular hypertrophy. Left ventricular diastolic parameters are indeterminate. Right  Ventricle: The right ventricular size is normal. No increase in right ventricular wall thickness. Right ventricular systolic function is normal. Left Atrium: Left atrial size was normal in size. Right Atrium: Right atrial size was normal in size. Pericardium: There is no evidence of pericardial effusion. Mitral Valve: The mitral valve is grossly normal. Trivial mitral valve regurgitation. Tricuspid Valve: The tricuspid valve is grossly normal. Tricuspid valve regurgitation is trivial. Aortic Valve: The aortic valve is grossly normal. Aortic valve regurgitation is not visualized. Pulmonic Valve: The pulmonic valve was grossly normal. Pulmonic valve regurgitation is not visualized. Aorta: The aortic root was not well visualized. IAS/Shunts: No atrial level shunt detected by color flow Doppler.  LEFT VENTRICLE PLAX 2D LV EF:         Left ventricular ejection fraction by PLAX is 67 %. LVIDd:         3.30 cm LVIDs:         2.10 cm LV PW:         1.80 cm LV IVS:        1.60 cm LVOT diam:     2.00 cm LVOT Area:     3.14 cm  LEFT ATRIUM         Index LA diam:    4.10 cm 1.78 cm/m                        PULMONIC VALVE AORTA                 PV Vmax:       0.71 m/s Ao Root diam: 4.00 cm PV Peak grad:  2.0 mmHg   SHUNTS Systemic Diam: 2.00 cm Rozell Searing Custovic Electronically signed by Clotilde Dieter Signature Date/Time: 06/07/2023/2:10:21 PM    Final    DG Chest Port 1 View  Result Date: 06/06/2023 CLINICAL DATA:  Dyspnea EXAM: PORTABLE CHEST 1 VIEW COMPARISON:  Chest x-ray dated June 04, 2023 FINDINGS: Cardiac and mediastinal contours are unchanged when accounting for differences in lung volumes. Mild right basilar opacity which is likely due to atelectasis. Lungs are otherwise clear. No large pleural effusion or pneumothorax. IMPRESSION: Mild right basilar opacity which is likely due to atelectasis. Electronically Signed   By: Allegra Lai M.D.   On: 06/06/2023 13:51   US Abdomen Limited RUQ (LIVER/GB)  Result  Date: 06/29/2023 CLINICAL DATA:  Right upper quadrant pain EXAM: ULTRASOUND ABDOMEN LIMITED RIGHT UPPER QUADRANT COMPARISON:  Abdominal MRI dated June 03, 2023 FINDINGS: Gallbladder: Gallstones and sludge within contracted gallbladder. Gallbladder wall thickening, measuring up to 6 mm. No sonographic Murphy sign noted by sonographer. Common bile duct: Diameter: 5.2 mm Liver: Multiple hypoattenuating solid liver lesions. Reference  lesion of the right hepatic lobe measuring 5.6 x 3.9 x 6.4 cm. Reference lesion of the left hepatic lobe measuring 3.1 x 3.0 x 3.6 cm. Nodular liver contour, likely due to liver lesions. Increased parenchymal echogenicity. Portal vein is patent on color Doppler imaging with normal direction of blood flow towards the liver. Other: Small volume abdominal ascites. IMPRESSION: 1. Multiple liver masses, consistent with metastatic disease. See previously performed cross-sectional imaging for further discussion. 2. Small volume abdominal ascites. 3. Gallstones and sludge within contracted gallbladder. 4. Gallbladder wall thickening, likely secondary to liver disease. Electronically Signed   By: Allegra Lai M.D.   On: 06/15/2023 21:15   DG Chest Port 1 View  Result Date: 06/21/2023 CLINICAL DATA:  Sepsis workup.  Hypotension and tachycardia. EXAM: PORTABLE CHEST 1 VIEW COMPARISON:  CT 05/18/2023.  Radiographs 12/04/2021. FINDINGS: 1541 hours. The heart size and mediastinal contours are stable with aortic atherosclerosis. There is asymmetric elevation of the right hemidiaphragm with mild bibasilar atelectasis. No edema, confluent airspace disease, pleural effusion or pneumothorax. Postsurgical changes noted within the lower thoracic spine. No acute osseous findings are seen. IMPRESSION: Mild bibasilar atelectasis. No evidence of pneumonia or edema. Electronically Signed   By: Carey Bullocks M.D.   On: 06/26/2023 18:16   MR ABDOMEN MRCP W WO CONTAST  Result Date: 06/03/2023 CLINICAL  DATA:  Elevated liver enzymes, hepatic metastatic disease, history of liver biopsy, worsening abdominal pain, follow-up recent CT EXAM: MRI ABDOMEN WITHOUT AND WITH CONTRAST (INCLUDING MRCP) TECHNIQUE: Multiplanar multisequence MR imaging of the abdomen was performed both before and after the administration of intravenous contrast. Heavily T2-weighted images of the biliary and pancreatic ducts were obtained, and three-dimensional MRCP images were rendered by post processing. CONTRAST:  10mL GADAVIST GADOBUTROL 1 MMOL/ML IV SOLN COMPARISON:  CT abdomen pelvis, 05/31/2023 FINDINGS: Lower chest: Trace right pleural effusion, unchanged. Hepatobiliary: Hepatomegaly, maximum coronal span 24.2 cm. The liver is grossly enlarged by numerous bulky rim enhancing, internally hypoenhancing liver metastases as seen on prior examinations, not significantly changed. Large index lesion of the peripheral right lobe of the liver, hepatic segments VI/VII measures 10.0 x 5.5 cm (series 27, image 32). Gallstones and sludge contracted in the gallbladder. No intra or extrahepatic biliary ductal dilatation. Pancreas: Unremarkable. No pancreatic ductal dilatation or surrounding inflammatory changes. Spleen: Normal in size without significant abnormality. Adrenals/Urinary Tract: Adrenal glands are unremarkable. Multiple simple, benign and thinly septated renal cortical cysts, for which no specific further follow-up or characterization is required. Lobulated cortical scarring of the right kidney with prominence of the right renal pelvis and associated mucosal thickening, similar to prior examination (series 29, image 65). No obvious calculi or hydronephrosis. Stomach/Bowel: Stomach is within normal limits. No evidence of bowel wall thickening, distention, or inflammatory changes. Vascular/Lymphatic: Aortic atherosclerosis. Enlarged portacaval and porta hepatis lymph nodes measuring up to 4.0 x 1.6 cm (series 27, image 49). Prominent  subcentimeter retroperitoneal lymph nodes (series 29, image 59). Other: No abdominal wall hernia or abnormality. Small volume perihepatic and perisplenic ascites, similar to prior examination. Musculoskeletal: Diffusely heterogeneous enhancement of the included vertebral bodies. IMPRESSION: 1. The liver is grossly enlarged by numerous bulky rim enhancing, internally hypoenhancing liver metastases as seen on prior examinations, not significantly changed. 2. No biliary ductal dilatation. 3. Enlarged portacaval and porta hepatis lymph nodes measuring up to 4.0 x 1.6 cm. Prominent subcentimeter retroperitoneal lymph nodes. Findings are suspicious for nodal metastatic disease. 4. Diffusely heterogeneous enhancement of the included vertebral bodies, suspicious for osseous  metastatic disease. 5. Small volume perihepatic and perisplenic ascites, similar to prior examination. Trace right pleural effusion, unchanged. 6. Lobulated cortical scarring of the right kidney with prominence of the right renal pelvis and associated mucosal thickening, similar to prior examination. No obvious calculi or hydronephrosis. 7. Cholelithiasis. Aortic Atherosclerosis (ICD10-I70.0). Electronically Signed   By: Jearld Lesch M.D.   On: 06/03/2023 10:02   MR 3D Recon At Scanner  Result Date: 06/03/2023 CLINICAL DATA:  Elevated liver enzymes, hepatic metastatic disease, history of liver biopsy, worsening abdominal pain, follow-up recent CT EXAM: MRI ABDOMEN WITHOUT AND WITH CONTRAST (INCLUDING MRCP) TECHNIQUE: Multiplanar multisequence MR imaging of the abdomen was performed both before and after the administration of intravenous contrast. Heavily T2-weighted images of the biliary and pancreatic ducts were obtained, and three-dimensional MRCP images were rendered by post processing. CONTRAST:  10mL GADAVIST GADOBUTROL 1 MMOL/ML IV SOLN COMPARISON:  CT abdomen pelvis, 05/31/2023 FINDINGS: Lower chest: Trace right pleural effusion, unchanged.  Hepatobiliary: Hepatomegaly, maximum coronal span 24.2 cm. The liver is grossly enlarged by numerous bulky rim enhancing, internally hypoenhancing liver metastases as seen on prior examinations, not significantly changed. Large index lesion of the peripheral right lobe of the liver, hepatic segments VI/VII measures 10.0 x 5.5 cm (series 27, image 32). Gallstones and sludge contracted in the gallbladder. No intra or extrahepatic biliary ductal dilatation. Pancreas: Unremarkable. No pancreatic ductal dilatation or surrounding inflammatory changes. Spleen: Normal in size without significant abnormality. Adrenals/Urinary Tract: Adrenal glands are unremarkable. Multiple simple, benign and thinly septated renal cortical cysts, for which no specific further follow-up or characterization is required. Lobulated cortical scarring of the right kidney with prominence of the right renal pelvis and associated mucosal thickening, similar to prior examination (series 29, image 65). No obvious calculi or hydronephrosis. Stomach/Bowel: Stomach is within normal limits. No evidence of bowel wall thickening, distention, or inflammatory changes. Vascular/Lymphatic: Aortic atherosclerosis. Enlarged portacaval and porta hepatis lymph nodes measuring up to 4.0 x 1.6 cm (series 27, image 49). Prominent subcentimeter retroperitoneal lymph nodes (series 29, image 59). Other: No abdominal wall hernia or abnormality. Small volume perihepatic and perisplenic ascites, similar to prior examination. Musculoskeletal: Diffusely heterogeneous enhancement of the included vertebral bodies. IMPRESSION: 1. The liver is grossly enlarged by numerous bulky rim enhancing, internally hypoenhancing liver metastases as seen on prior examinations, not significantly changed. 2. No biliary ductal dilatation. 3. Enlarged portacaval and porta hepatis lymph nodes measuring up to 4.0 x 1.6 cm. Prominent subcentimeter retroperitoneal lymph nodes. Findings are suspicious  for nodal metastatic disease. 4. Diffusely heterogeneous enhancement of the included vertebral bodies, suspicious for osseous metastatic disease. 5. Small volume perihepatic and perisplenic ascites, similar to prior examination. Trace right pleural effusion, unchanged. 6. Lobulated cortical scarring of the right kidney with prominence of the right renal pelvis and associated mucosal thickening, similar to prior examination. No obvious calculi or hydronephrosis. 7. Cholelithiasis. Aortic Atherosclerosis (ICD10-I70.0). Electronically Signed   By: Jearld Lesch M.D.   On: 06/03/2023 10:02   CT ABDOMEN PELVIS WO CONTRAST  Result Date: 05/31/2023 CLINICAL DATA:  Abdominal pain EXAM: CT ABDOMEN AND PELVIS WITHOUT CONTRAST TECHNIQUE: Multidetector CT imaging of the abdomen and pelvis was performed following the standard protocol without IV contrast. RADIATION DOSE REDUCTION: This exam was performed according to the departmental dose-optimization program which includes automated exposure control, adjustment of the mA and/or kV according to patient size and/or use of iterative reconstruction technique. COMPARISON:  05/07/2023 FINDINGS: Lower chest: Aortic and coronary artery atherosclerosis. Trace right  pleural effusion. Hepatobiliary: Diffuse hypoattenuating masses throughout both lobes of the liver, increased in size compared to the recent prior CT. Liver measures approximately 24 cm in length. Sludge or small stones within the gallbladder. Gallbladder is partially contracted. Pancreas: No focal abnormality of the pancreas is identified on noncontrast imaging. No inflammatory changes. Spleen: Normal in size without focal abnormality. Adrenals/Urinary Tract: No adrenal nodules or masses. Urothelial thickening of the right renal pelvis is similar in appearance to prior. No stone or hydronephrosis. Urinary bladder within normal limits. Stomach/Bowel: Stomach is within normal limits. Colonic diverticulosis. No evidence of  bowel wall thickening, distention, or inflammatory changes. Vascular/Lymphatic: Aortoiliac atherosclerosis without aneurysm. Several small retroperitoneal lymph nodes are similar to prior. No abdominopelvic lymphadenopathy by size criteria. Reproductive: Prostate is unremarkable. Other: Small volume ascites is new from prior. No pneumoperitoneum. Small fat containing umbilical hernia. Musculoskeletal: There are several small sclerotic lesions throughout the spine, bilateral ribs, and pelvis, similar to slightly increased compared to prior. Reference lesion is a small sclerotic lesion within the T8 vertebral body (series 5, image 90). IMPRESSION: 1. Worsening hepatic metastatic disease. 2. Small volume ascites is new from prior. 3. Several small sclerotic lesions throughout the spine, bilateral ribs, and pelvis, similar to slightly increased compared to prior. Findings are compatible with osseous metastatic disease. 4. Urothelial thickening of the right renal pelvis is similar in appearance to prior. Correlate with urinalysis. 5. Trace right pleural effusion. 6. Colonic diverticulosis without evidence of diverticulitis. No bowel obstruction. 7. Aortic and coronary artery atherosclerosis (ICD10-I70.0). Electronically Signed   By: Duanne Guess D.O.   On: 05/31/2023 18:30   CT CHEST W CONTRAST  Result Date: 05/27/2023 CLINICAL DATA:  Liver metastases on recent CT abdomen. * Tracking Code: BO * EXAM: CT CHEST WITH CONTRAST TECHNIQUE: Multidetector CT imaging of the chest was performed during intravenous contrast administration. RADIATION DOSE REDUCTION: This exam was performed according to the departmental dose-optimization program which includes automated exposure control, adjustment of the mA and/or kV according to patient size and/or use of iterative reconstruction technique. CONTRAST:  75mL OMNIPAQUE IOHEXOL 300 MG/ML  SOLN COMPARISON:  CT abdomen pelvis 05/07/2023 and CT chest 12/04/2021. FINDINGS:  Cardiovascular: Atherosclerotic calcification of the aorta, aortic valve and coronary arteries. Ascending aorta measures up to 3.9 cm (4/71). Enlarged pulmonic trunk and heart. No pericardial effusion. Mediastinum/Nodes: No pathologically enlarged mediastinal, hilar or axillary lymph nodes. Esophagus is grossly unremarkable. Lungs/Pleura: Lungs are clear. No pleural fluid. Airway is unremarkable. Upper Abdomen: Mildly hypoattenuating masses throughout the liver with index mass in the right hepatic lobe measuring 6.5 x 8.3 cm (2/145). Visualized portions of the gallbladder, adrenal glands and right kidney are otherwise grossly unremarkable. There may be a left renal sinus cyst, partially imaged. No specific follow-up necessary. Visualized portions of the spleen, pancreas, stomach and bowel are grossly unremarkable. Scattered ascites. Periportal adenopathy measures up to 1.5 cm. Musculoskeletal: Degenerative changes in the spine. T11-12 posterior fusion. Scattered sclerotic lesions in the visualized osseous structures, new from 12/04/2021. IMPRESSION: 1. Hepatic and osseous metastatic disease. No evidence of primary malignancy in the chest. 2. Scattered ascites. 3. Aortic atherosclerosis (ICD10-I70.0). Coronary artery calcification. 4. Enlarged pulmonic trunk, indicative of pulmonary arterial hypertension. Electronically Signed   By: Leanna Battles M.D.   On: 05/27/2023 13:45   US BIOPSY (LIVER)  Result Date: 05/27/2023 INDICATION: Multiple liver lesions with unknown primary malignancy. EXAM: ULTRASOUND GUIDED CORE BIOPSY OF LIVER MASS MEDICATIONS: None. ANESTHESIA/SEDATION: Moderate (conscious) sedation was employed during this  procedure. A total of Versed 1.0 mg and Fentanyl 50 mcg was administered intravenously. Moderate Sedation Time: 10 minutes. The patient's level of consciousness and vital signs were monitored continuously by radiology nursing throughout the procedure under my direct supervision.  PROCEDURE: The procedure, risks, benefits, and alternatives were explained to the patient. Questions regarding the procedure were encouraged and answered. The patient understands and consents to the procedure. A time-out was performed prior to initiating the procedure. Ultrasound was performed to localize liver lesions. The abdominal wall was prepped with chlorhexidine in a sterile fashion, and a sterile drape was applied covering the operative field. A sterile gown and sterile gloves were used for the procedure. Local anesthesia was provided with 1% Lidocaine. A 17 gauge trocar needle was advanced into the liver at the level of a right lobe liver lesion under ultrasound guidance. After confirming needle tip position, coaxial 18 gauge core biopsy samples were obtained and submitted in formalin. Three total core biopsy samples were obtained. Gel-Foam pledgets were advanced through the outer needle prior to its removal. Additional ultrasound was performed. COMPLICATIONS: None immediate. FINDINGS: Ultrasound demonstrates multiple circumscribed and ill-defined lesions within the liver. A lesion in the inferior right lobe measuring up to approximately 4.8 cm in maximum diameter was chosen for sampling. Solid core biopsy samples were obtained. IMPRESSION: Ultrasound-guided core biopsy performed of a mass within the right lobe of the liver measuring up to approximately 4.8 cm in maximum diameter. Electronically Signed   By: Irish Lack M.D.   On: 05/27/2023 13:27    Microbiology Recent Results (from the past 240 hour(s))  Urine Culture     Status: None   Collection Time: 05/31/23  5:00 PM   Specimen: Urine, Clean Catch  Result Value Ref Range Status   Specimen Description   Final    URINE, CLEAN CATCH Performed at [facility], [address], [place], [place] [address]    Special Requests   Final    NONE Performed at [facility], [address], [place], [place] [address]     Culture   Final    NO GROWTH Performed at [facility], [address], [place], [place] [address]    Report Status 06/02/2023 FINAL  Final  Culture, blood (Routine x 2)     Status: None   Collection Time: 06/25/2023  3:59 PM   Specimen: BLOOD  Result Value Ref Range Status   Specimen Description BLOOD RIGHT ANTECUBITAL  Final   Special Requests   Final    BOTTLES DRAWN AEROBIC AND ANAEROBIC Blood Culture results may not be optimal due to an excessive volume of blood received in culture bottles   Culture   Final    NO GROWTH 5 DAYS Performed at [facility], [address] [place], [place] [address]    Report Status 06/09/2023 FINAL  Final  Culture, blood (Routine x 2)     Status: None   Collection Time: 06/12/2023  4:00 PM   Specimen: BLOOD RIGHT ARM  Result Value Ref Range Status   Specimen Description BLOOD RIGHT ARM  Final   Special Requests   Final    BOTTLES DRAWN AEROBIC AND ANAEROBIC Blood Culture adequate volume   Culture   Final    NO GROWTH 5 DAYS Performed at [facility], [address], [place], [place] [address]    Report Status 06/09/2023 FINAL  Final  Urine Culture     Status: None   Collection  Time: 06/28/2023  5:16 PM   Specimen: Urine, Random  Result Value Ref Range Status   Specimen Description   Final    URINE, RANDOM Performed at [facility], [address], [place], [place] [address]    Special Requests   Final    NONE Reflexed from [id number] Performed at [facility], [address] [place], [place] [address]    Culture   Final    NO GROWTH Performed at [facility], [address] [place], [place] [address]    Report Status 06/06/2023 FINAL  Final  MRSA Next Gen by PCR, Nasal     Status: None   Collection Time: 06/07/23  1:20 PM   Specimen: Nasal Mucosa; Nasal Swab  Result Value Ref Range Status   MRSA by PCR Next Gen NOT DETECTED NOT DETECTED Final    Comment: (NOTE) The GeneXpert MRSA Assay  (FDA approved for NASAL specimens only), is one component of a comprehensive MRSA colonization surveillance program. It is not intended to diagnose MRSA infection nor to guide or monitor treatment for MRSA infections. Test performance is not FDA approved in patients less than 37 years old. Performed at [facility], [address] [place], [place] [address]     Lab Basic Metabolic Panel: Recent Labs  Lab 06/05/23 2119 06/06/23 0547 06/07/23 1632 06/07/23 1830 06/07/23 2254 06/08/23 0410 06/08/23 2036 06/09/23 0649  NA 131*   < > 129* 134* 133* 134*  --  135  K 4.9   < > 4.5 5.4* 5.2* 5.7* 5.0 5.3*  CL 99   < > 104 102 101 99  --  96*  CO2 21*   < > 9* 18* 17* 18*  --  18*  GLUCOSE 166*   < > 66* 125* 160* 166*  --  201*  BUN 57*   < > 37* 70* 75* 76*  --  94*  CREATININE 1.87*   < > 1.03 2.47* 2.67* 2.71*  --  3.70*  CALCIUM 7.9*   < > 6.4* 7.6* 7.4* 7.8*  --  7.8*  MG 2.9*  --   --   --   --  2.8*  --  2.9*  PHOS  --   --   --   --   --  7.0*  --  8.3*   < > = values in this interval not displayed.   Liver Function Tests: Recent Labs  Lab 06/06/23 0547 06/07/23 0557 06/07/23 1632 06/07/23 1830 06/07/23 2254 06/09/23 0649  AST 420* 331* 117* 272* 268*  --   ALT 171* 150* 55* 124* 117*  --   ALKPHOS 846* 756* 275* 646* 583*  --   BILITOT 7.0* 6.4* 2.4* 5.4* 5.8*  --   PROT 5.4* 5.6* <3.0* 4.8* 5.0*  --   ALBUMIN 2.0* 2.0* <1.5* 1.6* 2.1* 1.9*   No results for input(s): "LIPASE", "AMYLASE" in the last 168 hours. No results for input(s): "AMMONIA" in the last 168 hours. CBC: Recent Labs  Lab 06/21/2023 1520 06/05/2023 2102 06/06/23 0547 06/07/23 0557 06/08/23 0410 06/09/23 0649  WBC 17.9* 17.8* 17.7* 16.3* 16.0* 17.8*  NEUTROABS 15.1*  --   --   --   --   --   HGB 15.2 13.9 14.1 14.3 13.0 12.9*  HCT 45.0 40.6 40.3 41.1 37.5* 37.7*  MCV 79.8* 80.6 78.3* 78.3* 80.5 79.0*  PLT 329 300 313 324 327 357   Cardiac Enzymes: No results for input(s):  "  CKTOTAL", "CKMB", "CKMBINDEX", "TROPONINI" in the last 168 hours. Sepsis Labs: Recent Labs  Lab 06/22/2023 2102 06/05/23 0842 06/06/23 0547 06/06/23 1250 06/07/23 0557 06/07/23 1633 06/07/23 1941 06/07/23 2254 06/08/23 0410 06/09/23 0649  PROCALCITON 4.04  --  4.89  --   --   --   --   --   --   --   WBC 17.8*  --  17.7*  --  16.3*  --   --   --  16.0* 17.8*  LATICACIDVEN  --    < > 4.1*   < > 3.3* >9.0* 4.1* 4.2* 4.2*  --    < > = values in this interval not displayed.    Procedures/Operations  N/A   Cheryll Cockayne Rust-Chester, AGACNP-BC Acute Care Nurse Practitioner  Pulmonary & Critical Care   [phone] / [phone] Please see Amion for details.

## 2023-07-04 DEATH — deceased
